# Patient Record
Sex: Female | Born: 1955 | Race: White | Hispanic: No | Smoking: Current every day smoker
Health system: Southern US, Community
[De-identification: ages and names within clinical notes are randomized; demographics above are authoritative.]

## PROBLEM LIST (undated history)

## (undated) DIAGNOSIS — F419 Anxiety disorder, unspecified: Secondary | ICD-10-CM

## (undated) DIAGNOSIS — Z86006 Personal history of melanoma in-situ: Secondary | ICD-10-CM

## (undated) DIAGNOSIS — Z808 Family history of malignant neoplasm of other organs or systems: Secondary | ICD-10-CM

## (undated) DIAGNOSIS — Z923 Personal history of irradiation: Secondary | ICD-10-CM

## (undated) DIAGNOSIS — N951 Menopausal and female climacteric states: Secondary | ICD-10-CM

## (undated) DIAGNOSIS — Z9289 Personal history of other medical treatment: Secondary | ICD-10-CM

## (undated) DIAGNOSIS — R51 Headache: Secondary | ICD-10-CM

## (undated) DIAGNOSIS — K219 Gastro-esophageal reflux disease without esophagitis: Secondary | ICD-10-CM

## (undated) DIAGNOSIS — R519 Headache, unspecified: Secondary | ICD-10-CM

## (undated) DIAGNOSIS — F411 Generalized anxiety disorder: Secondary | ICD-10-CM

## (undated) DIAGNOSIS — M1990 Unspecified osteoarthritis, unspecified site: Secondary | ICD-10-CM

## (undated) HISTORY — DX: Anxiety disorder, unspecified: F41.9

## (undated) HISTORY — PX: EXPLORATORY LAPAROTOMY: SUR591

## (undated) HISTORY — DX: Headache: R51

## (undated) HISTORY — DX: Menopausal and female climacteric states: N95.1

## (undated) HISTORY — PX: DILITATION & CURRETTAGE/HYSTROSCOPY WITH ESSURE: SHX5573

## (undated) HISTORY — DX: Headache, unspecified: R51.9

## (undated) HISTORY — PX: OTHER SURGICAL HISTORY: SHX169

## (undated) HISTORY — PX: HERNIA REPAIR: SHX51

## (undated) HISTORY — PX: TUBAL LIGATION: SHX77

## (undated) HISTORY — DX: Personal history of other medical treatment: Z92.89

## (undated) HISTORY — DX: Personal history of melanoma in-situ: Z86.006

## (undated) HISTORY — DX: Family history of malignant neoplasm of other organs or systems: Z80.8

## (undated) HISTORY — DX: Generalized anxiety disorder: F41.1

## (undated) HISTORY — DX: Personal history of irradiation: Z92.3

## (undated) HISTORY — DX: Gastro-esophageal reflux disease without esophagitis: K21.9

---

## 2002-08-23 ENCOUNTER — Ambulatory Visit (HOSPITAL_COMMUNITY): Admission: RE | Admit: 2002-08-23 | Discharge: 2002-08-23 | Payer: Self-pay | Admitting: *Deleted

## 2006-05-19 ENCOUNTER — Other Ambulatory Visit: Admission: RE | Admit: 2006-05-19 | Discharge: 2006-05-19 | Payer: Self-pay | Admitting: *Deleted

## 2007-05-22 ENCOUNTER — Other Ambulatory Visit: Admission: RE | Admit: 2007-05-22 | Discharge: 2007-05-22 | Payer: Self-pay | Admitting: *Deleted

## 2007-06-14 ENCOUNTER — Encounter: Admission: RE | Admit: 2007-06-14 | Discharge: 2007-06-14 | Payer: Self-pay | Admitting: Family Medicine

## 2013-02-14 ENCOUNTER — Ambulatory Visit (INDEPENDENT_AMBULATORY_CARE_PROVIDER_SITE_OTHER): Payer: 59 | Admitting: Psychiatry

## 2013-02-14 ENCOUNTER — Encounter (HOSPITAL_COMMUNITY): Payer: Self-pay | Admitting: Psychiatry

## 2013-02-14 VITALS — BP 115/68 | HR 74 | Ht 62.5 in | Wt 153.0 lb

## 2013-02-14 DIAGNOSIS — F411 Generalized anxiety disorder: Secondary | ICD-10-CM

## 2013-02-14 NOTE — Progress Notes (Signed)
Psychiatric Assessment Adult  Patient Identification:  Mallory [name]  Date of Evaluation:  02/14/2013  Chief Complaint:  Chief Complaint  Patient presents with  . Anxiety  . Depression    Anxiety Presents for initial (Mallory [name] 57 y/o  woman with symptoms of anxiety and depression.) visit. Onset was more than 5 years ago. Symptoms include depressed mood, excessive worry, hyperventilation, insomnia, irritability and nervous/anxious behavior. Patient reports no chest pain, dizziness, nausea, palpitations or shortness of breath. Symptoms occur occasionally. Duration: Hours to days. The severity of symptoms is causing significant distress. The symptoms are aggravated by family issues and work stress. The quality of sleep is poor. Nighttime awakenings: one to two.   Risk factors include emotional abuse. Past treatments include non-SSRI antidepressants and SSRIs. The treatment provided mild relief. Compliance with prior treatments has been good. Past compliance problems: None.   The patient reports that the reason she is her is at her husband's request.  She has been charged with shoplifting three times and her husband demanded she "see somebody" to evaluate for treatment options.  She is currently taking Venlafaxine 300 mg daily and feels she has been doing relatively well with the medication.  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 Review of Systems  Constitutional: Positive for irritability. Negative for fever, chills, activity change, appetite change and fatigue.  Respiratory: Negative for cough, chest tightness, shortness of breath and wheezing.   Cardiovascular: Negative for chest pain, palpitations and leg swelling.  Gastrointestinal: Negative for nausea, abdominal pain, diarrhea, constipation, blood in stool, abdominal distention and anal bleeding.  Neurological: Negative for dizziness,  facial asymmetry, light-headedness, numbness and headaches.  Psychiatric/Behavioral: The patient is nervous/anxious and has insomnia.    Filed Vitals:   02/14/13 1420  BP: 115/68  Pulse: 74  Height: 5' 2.5" (1.588 m)  Weight: 153 lb (69.4 kg)   Physical Exam  Vitals reviewed. Constitutional: She appears well-developed and well-nourished. No distress.  Skin: She is not diaphoretic.    Depressive Symptoms: depressed mood, anhedonia, anxiety,  (Hypo) Manic Symptoms:   Elevated Mood:  Negative Irritable Mood:  Yes Grandiosity:  Negative Distractibility:  Negative Labiality of Mood:  Yes Delusions:  No Hallucinations:  No Impulsivity:  No Sexually Inappropriate Behavior:  No Financial Extravagance:  No Flight of Ideas:  Negative  Anxiety Symptoms: Excessive Worry:  Yes Panic Symptoms:  No Agoraphobia:  Negative Obsessive Compulsive: Negative  Symptoms: None, Specific Phobias:  No-Chickens-she is was attacked by a chicken at 80-9 y/o Social Anxiety:  Negative  Psychotic Symptoms:  Hallucinations: Negative None Delusions:  No Paranoia:  No   Ideas of Reference:  No  PTSD Symptoms: Ever had a traumatic exposure:  Yes Had a traumatic exposure in the last month:  No Re-experiencing: Negative None Hypervigilance:  Yes Hyperarousal: Yes Increased Startle Response Irritability/Anger Avoidance: No None  Traumatic Brain Injury: Negative   Past Psychiatric History: Diagnosis: None  Hospitalizations: Patient denies.  Outpatient Care:  Patient denies.  Substance Abuse Care: Patient denies.  Self-Mutilation:  Patient denies.  Suicidal Attempts: Patient denies.  Violent Behaviors:  Patient denies.   Past Medical History:   Past Medical History  Diagnosis Date  . GERD (gastroesophageal reflux disease)   . Peri-menopause     History of Loss of Consciousness:  Negative Seizure History:  Negative Cardiac History:  Negative  Allergies:  Allergies not on  file  Current Medications:  Current Outpatient Prescriptions  Medication Sig Dispense Refill  . fexofenadine (ALLEGRA) 180 MG tablet Take 180 mg by mouth daily.      Marland Kitchen MINIVELLE 0.05 MG/24HR Place 1 patch onto the skin daily.      . pantoprazole (PROTONIX) 20 MG tablet Take 20 mg by mouth 2 (two) times daily.      . progesterone (PROMETRIUM) 200 MG capsule Take 1 capsule by mouth. Take every 3 months for 12 days.      Marland Kitchen venlafaxine XR (EFFEXOR-XR) 150 MG 24 hr capsule Take 150 mg by mouth 2 (two) times daily.       No current facility-administered medications for this visit.    Previous Psychotropic Medications:  Medication Dose   Fluoxetine-worked  Unknown  Effexor Unknown   Substance Abuse History in the last 12 months: Caffeine:  Nicotine: Patient denies.  Alcohol: Patient denies.  Illicit Drugs: Patient denies.   Medical Consequences of Substance Abuse: Patient denies.   Legal Consequences of Substance Abuse: Patient denies.   Family Consequences of Substance Abuse: Patient denies.   Blackouts:  No DT's:  No Withdrawal Symptoms:  Negative None  Social History: Current Place of Residence:[place], [place] Place of Birth: [place], [place] Family Members: Lives with her husband. She has one 74 y/o Daughter. Mallory [name]. Mother is still alive and she calls her twice a week. Marital Status:  Married Children: 1  Daughters: 1 Relationships: She reports that she has a friend she talks her main source of emotional support. Education:  [employer] Problems/Performance:Classes were difficult. Religious Beliefs/Practices: Yes. History of Abuse: verbal Occupational Experiences; Nursing-CNA, now working in Office manager. Military History:  None. Legal History:Charged with shoplifting. Hobbies/Interests: Reading, Gardening, Crafts  Family History:   Family History  Problem Relation Age of Onset  . Dementia Mother   . Dementia Father   . Coronary artery disease Maternal  Grandfather   . Cancer Maternal Grandfather   . Coronary artery disease Maternal Grandmother   . Cancer Maternal Grandmother   . Coronary artery disease Paternal Grandfather   . Cancer Paternal Grandfather   . Coronary artery disease Paternal Grandmother   . Cancer Paternal Grandmother   . Diabetes Cousin   . Alcohol abuse Neg Hx   . Anxiety disorder Neg Hx   . Bipolar disorder Neg Hx   . Depression Neg Hx   . Drug abuse Neg Hx   . OCD Neg Hx   . Schizophrenia Neg Hx   . ADD / ADHD Neg Hx     Mental Status Examination/Evaluation: Objective:  Appearance: Casual and Well Groomed  Eye Contact::  Good  Speech:  Clear and Coherent and Normal Rate  Volume:  Normal  Mood:  "good" 7/10  (0=Very depressed; 5=Neutral; 10=Very Happy)   Affect:  Appropriate, Congruent and Constricted  Thought Process:  Coherent, Linear and Logical  Orientation:  Full (Time, Place, and Person)  Thought Content:  WDL  Suicidal Thoughts:  No  Homicidal Thoughts:  No  Judgement:  Good  Insight:  Good  Psychomotor Activity:  Normal  Akathisia:  Negative  Handed:  Right  Memory 3/3 Intact; recent 3/3  AIMS (if indicated): Patient denies  Assets:  Communication Skills Desire for Improvement Financial Resources/Insurance Housing Intimacy Leisure Time Physical Health Resilience Social Support Talents/Skills Transportation    Laboratory/X-Ray Psychological Evaluation(s)   none  None   Assessment:    AXIS I Generalized Anxiety Disorder  AXIS II Cluster C Traits  rule out obsessive compulsive personality disorder  AXIS III Past Medical History  Diagnosis Date  . GERD (gastroesophageal reflux disease)   . Peri-menopause      AXIS IV problems with primary support group  AXIS V 51-60 moderate symptoms   Treatment Plan/Recommendations:  Plan of Care:  PLAN:  1. Affirm with the patient that the medications are taken as ordered. Patient  expressed understanding of how their medications were  to be used.    Laboratory:  No labs warranted at this time.   Psychotherapy: Therapy: brief supportive therapy provided. Discussed psychosocial stressors in detail. Continue current services.   Medications:  Will continue following psychiatric medications as written prior to this appointment a) Continue venlafaxine Xl 300 mg. -Risks and benefits, side effects and alternatives discussed with patient, he/she was given an opportunity to ask questions about his/her medication, illness, and treatment. All current psychiatric medications have been reviewed and discussed with the patient and adjusted as clinically appropriate. The patient has been provided an accurate and updated list of the medications being now prescribed.   Routine PRN Medications:  Negative  Consultations: The patient was encouraged to keep all PCP and specialty clinic appointments.   Safety Concerns:   Patient told to call clinic if any problems occur. Patient advised to go to  ER  if she should develop SI/HI, side effects, or if symptoms worsen. Has crisis numbers to call if needed.    Other:   8. Patient was instructed to return to clinic in 2 weeks.  9. The patient was advised to call and cancel their mental health appointment within 24 hours of the appointment, if they are unable to keep the appointment, as well as the three no show and termination from clinic policy. 10. The patient expressed understanding of the plan and agrees with the above.   Jacqulyn Cane, MD 6/4/20142:06 PM

## 2013-03-05 ENCOUNTER — Encounter (HOSPITAL_COMMUNITY): Payer: Self-pay | Admitting: Psychiatry

## 2013-03-05 ENCOUNTER — Ambulatory Visit (INDEPENDENT_AMBULATORY_CARE_PROVIDER_SITE_OTHER): Payer: 59 | Admitting: Psychiatry

## 2013-03-05 VITALS — BP 128/70 | HR 77 | Ht 62.5 in | Wt 154.0 lb

## 2013-03-05 DIAGNOSIS — F411 Generalized anxiety disorder: Secondary | ICD-10-CM

## 2013-03-05 MED ORDER — VENLAFAXINE HCL ER 150 MG PO CP24: 150.0000 mg | ORAL_CAPSULE | Freq: Two times a day (BID) | ORAL | Status: DC

## 2013-03-05 NOTE — Progress Notes (Signed)
[facility] Follow-up Appointment  Patient Identification:  Mallory [name]  Date of Evaluation:  03/05/2013  Chief Complaint:  Chief Complaint  Patient presents with  . Follow-up    HPI Comments: Mallory [name] 57 y/o  woman with symptoms of Generalized Anxiety Disorder.  The patient reports continued marital difficulties.  She has tried to talk to her husband but she reports he has no empathy.  She finds that she cannot talk to her daughter about her problems either, as she feels she will talk to her husband about what she talks about.  She reports she is considering a divorce. She has not gone to marital counseling yet but feels her husband will no participate.  Anxiety Presents for follow-up visit. Onset was more than 5 years ago. Symptoms include excessive worry and nervous/anxious behavior (same). Patient reports no chest pain, depressed mood (Improved), dizziness, hyperventilation, insomnia, irritability (Has improved.), nausea, palpitations or shortness of breath. Symptoms occur occasionally. Duration: Hours. The severity of symptoms is causing significant distress. The symptoms are aggravated by family issues and work stress. The quality of sleep is poor. Nighttime awakenings: one to two.   Risk factors include emotional abuse. Past treatments include non-SSRI antidepressants and SSRIs. The treatment provided mild relief. Compliance with prior treatments has been good. Past compliance problems: None. Compliance with medications is 76-100%. Treatment side effects: Patient denies.    Review of Systems  Constitutional: Negative for fever, chills, activity change, appetite change, irritability (Has improved.) and fatigue.  Respiratory: Negative for cough, chest tightness, shortness of breath and wheezing.   Cardiovascular: Negative for chest pain, palpitations and leg swelling.  Gastrointestinal: Negative for nausea, abdominal pain, diarrhea, constipation, blood in stool, abdominal  distention and anal bleeding.  Neurological: Negative for dizziness, facial asymmetry, light-headedness, numbness and headaches.  Psychiatric/Behavioral: The patient is nervous/anxious (same). The patient does not have insomnia.    Filed Vitals:   03/05/13 1010  BP: 128/70  Pulse: 77  Height: 5' 2.5" (1.588 m)  Weight: 154 lb (69.854 kg)   Physical Exam  Vitals reviewed. Constitutional: She appears well-developed and well-nourished. No distress.  Skin: She is not diaphoretic.  Musculoskeletal: Strength & Muscle Tone: within normal limits Gait & Station: normal Patient leans: N/A   Depressive Symptoms: depressed mood, anhedonia, anxiety,  (Hypo) Manic Symptoms:   Elevated Mood:  Negative Irritable Mood:  Yes Grandiosity:  Negative Distractibility:  Negative Labiality of Mood:  Yes Delusions:  No Hallucinations:  No Impulsivity:  No Sexually Inappropriate Behavior:  No Financial Extravagance:  No Flight of Ideas:  Negative  Anxiety Symptoms: Excessive Worry:  Yes Panic Symptoms:  No Agoraphobia:  Negative Obsessive Compulsive: Negative  Symptoms: None, Specific Phobias:  No-Chickens-she is was attacked by a chicken at [age] y/o Social Anxiety:  Negative  Psychotic Symptoms:  Hallucinations: Negative None Delusions:  No Paranoia:  No   Ideas of Reference:  No  PTSD Symptoms: Ever had a traumatic exposure:  Yes Had a traumatic exposure in the last month:  No Re-experiencing: Negative None Hypervigilance:  Yes Hyperarousal: Yes Increased Startle Response Irritability/Anger Avoidance: No None  Traumatic Brain Injury: Negative   Past Psychiatric History: Diagnosis: None  Hospitalizations: Patient denies.  Outpatient Care:  Patient denies.  Substance Abuse Care: Patient denies.  Self-Mutilation:  Patient denies.  Suicidal Attempts: Patient denies.  Violent Behaviors:  Patient denies.   Past Medical History:   Past Medical History  Diagnosis Date  .  GERD (gastroesophageal reflux disease)   .  Peri-menopause     History of Loss of Consciousness:  Negative Seizure History:  Negative Cardiac History:  Negative  Allergies:  No Known Allergies  Current Medications:  Current Outpatient Prescriptions  Medication Sig Dispense Refill  . ALPRAZolam (XANAX) 0.5 MG tablet Take 0.5 mg by mouth at bedtime as needed for anxiety.      . fexofenadine (ALLEGRA) 180 MG tablet Take 180 mg by mouth daily.      . progesterone (PROMETRIUM) 200 MG capsule Take 1 capsule by mouth. Take every 3 months for 12 days.      Marland Kitchen MINIVELLE 0.05 MG/24HR Place 1 patch onto the skin daily.      . pantoprazole (PROTONIX) 20 MG tablet Take 20 mg by mouth 2 (two) times daily.      Marland Kitchen venlafaxine XR (EFFEXOR-XR) 150 MG 24 hr capsule Take 150 mg by mouth 2 (two) times daily.       No current facility-administered medications for this visit.    Previous Psychotropic Medications:  Medication Dose   Fluoxetine-worked  Unknown  Effexor Unknown   Substance Abuse History in the last 12 months: Caffeine:  Nicotine: Patient denies.  Alcohol: Patient denies.  Illicit Drugs: Patient denies.   Medical Consequences of Substance Abuse: Patient denies.   Legal Consequences of Substance Abuse: Patient denies.   Family Consequences of Substance Abuse: Patient denies.   Blackouts:  No DT's:  No Withdrawal Symptoms:  Negative None  Social History: Current Place of Residence:,  Place of Birth: [place], [place] Family Members: Lives with her husband. She has one 35 y/o Daughter. Mallory [name]. Mother is still alive and she calls her twice a week. Marital Status:  Married Children: 1  Daughters: 1 Relationships: She reports that she has a friend she talks her main source of emotional support. Education:  [employer] Problems/Performance:Classes were difficult. Religious Beliefs/Practices: Yes. History of Abuse: verbal Occupational Experiences;  Nursing-CNA, now working in Office manager. Military History:  None. Legal History:Charged with shoplifting. Hobbies/Interests: Reading, Gardening, Crafts  Family History:   Family History  Problem Relation Age of Onset  . Dementia Mother   . Dementia Father   . Coronary artery disease Maternal Grandfather   . Cancer Maternal Grandfather   . Coronary artery disease Maternal Grandmother   . Cancer Maternal Grandmother   . Coronary artery disease Paternal Grandfather   . Cancer Paternal Grandfather   . Coronary artery disease Paternal Grandmother   . Cancer Paternal Grandmother   . Diabetes Cousin   . Alcohol abuse Neg Hx   . Anxiety disorder Neg Hx   . Bipolar disorder Neg Hx   . Depression Neg Hx   . Drug abuse Neg Hx   . OCD Neg Hx   . Schizophrenia Neg Hx   . ADD / ADHD Neg Hx     Mental Status Examination/Evaluation: Objective:  Appearance: Casual and Well Groomed  Eye Contact::  Good  Speech:  Clear and Coherent and Normal Rate  Volume:  Normal  Mood:  "good" 7/10  (0=Very depressed; 5=Neutral; 10=Very Happy)   Affect:  Appropriate, Congruent and Constricted  Thought Process:  Coherent, Linear and Logical  Orientation:  Full (Time, Place, and Person)  Thought Content:  WDL  Suicidal Thoughts:  No  Homicidal Thoughts:  No  Judgement:  Good  Insight:  Good  Psychomotor Activity:  Normal  Akathisia:  Negative  Handed:  Right  Memory 3/3 Intact; recent 3/3  AIMS (if indicated): Patient denies  Assets:  Communication Skills Desire for Improvement Financial Resources/Insurance Housing Intimacy Leisure Time Physical Health Resilience Social Support Talents/Skills Transportation    Laboratory/X-Ray Psychological Evaluation(s)   none  None   Assessment:    AXIS I Generalized Anxiety Disorder  AXIS II Cluster C Traits rule out obsessive compulsive personality disorder  AXIS III Past Medical History  Diagnosis Date  . GERD (gastroesophageal reflux disease)    . Peri-menopause      AXIS IV problems with primary support group  AXIS V 51-60 moderate symptoms   Treatment Plan/Recommendations:  Plan of Care:  PLAN:  1. Affirm with the patient that the medications are taken as ordered. Patient  expressed understanding of how their medications were to be used.    Laboratory:  No labs warranted at this time.   Psychotherapy: Therapy: brief supportive therapy provided. Discussed psychosocial stressors in detail. Advised patient to talk to a couples counselor and individual therapist.   Medications:  Will continue following psychiatric medications as written prior to this appointment: a) Continue venlafaxine XL 300 mg. B) Will consider hydroxyzine for anxiety if needed. -Risks and benefits, side effects and alternatives discussed with patient, he/she was given an opportunity to ask questions about his/her medication, illness, and treatment. All current psychiatric medications have been reviewed and discussed with the patient and adjusted as clinically appropriate. The patient has been provided an accurate and updated list of the medications being now prescribed.   Routine PRN Medications:  Negative  Consultations: The patient was encouraged to keep all PCP and specialty clinic appointments.   Safety Concerns:   Patient told to call clinic if any problems occur. Patient advised to go to  ER  if she should develop SI/HI, side effects, or if symptoms worsen. Has crisis numbers to call if needed.    Other:   8. Patient was instructed to return to clinic in 2 weeks.  9. The patient was advised to call and cancel their mental health appointment within 24 hours of the appointment, if they are unable to keep the appointment, as well as the three no show and termination from clinic policy. 10. The patient expressed understanding of the plan and agrees with the above.   Jacqulyn Cane, MD 6/23/201410:09 AM

## 2013-03-20 ENCOUNTER — Encounter (HOSPITAL_COMMUNITY): Payer: Self-pay | Admitting: Psychiatry

## 2013-03-20 ENCOUNTER — Ambulatory Visit (INDEPENDENT_AMBULATORY_CARE_PROVIDER_SITE_OTHER): Payer: 59 | Admitting: Psychiatry

## 2013-03-20 VITALS — BP 111/77 | HR 82 | Ht 62.5 in | Wt 154.0 lb

## 2013-03-20 DIAGNOSIS — F411 Generalized anxiety disorder: Secondary | ICD-10-CM

## 2013-03-20 MED ORDER — VENLAFAXINE HCL ER 150 MG PO CP24: 150.0000 mg | ORAL_CAPSULE | Freq: Two times a day (BID) | ORAL | Status: DC
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
## 2013-05-22 ENCOUNTER — Encounter (HOSPITAL_COMMUNITY): Payer: Self-pay | Admitting: Psychiatry

## 2013-05-22 ENCOUNTER — Ambulatory Visit (INDEPENDENT_AMBULATORY_CARE_PROVIDER_SITE_OTHER): Payer: 59 | Admitting: Psychiatry

## 2013-05-22 VITALS — BP 121/93 | HR 78 | Ht 62.5 in | Wt 156.0 lb

## 2013-05-22 DIAGNOSIS — F411 Generalized anxiety disorder: Secondary | ICD-10-CM

## 2013-05-22 MED ORDER — HYDROXYZINE HCL 25 MG PO TABS: 25.0000 mg | ORAL_TABLET | Freq: Two times a day (BID) | ORAL | Status: DC | PRN

## 2013-05-22 MED ORDER — VENLAFAXINE HCL ER 150 MG PO CP24: 150.0000 mg | ORAL_CAPSULE | Freq: Two times a day (BID) | ORAL | Status: DC
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
## 2013-06-19 ENCOUNTER — Ambulatory Visit (HOSPITAL_COMMUNITY): Payer: Self-pay | Admitting: Psychiatry

## 2013-06-20 ENCOUNTER — Telehealth (HOSPITAL_COMMUNITY): Payer: Self-pay

## 2013-06-20 NOTE — Telephone Encounter (Signed)
Acknowledged.

## 2013-06-20 NOTE — Telephone Encounter (Signed)
HUSBAND IS COMING WITH HER TO APPOINTMENT TOMORROW. HE DOESN'T BELIEVE SHE IS TELLING YOU EVERYTHING. SHE WANTED TO GIVE YOU A HEADS UP.

## 2013-06-21 ENCOUNTER — Ambulatory Visit (INDEPENDENT_AMBULATORY_CARE_PROVIDER_SITE_OTHER): Payer: 59 | Admitting: Psychiatry

## 2013-06-21 ENCOUNTER — Encounter (HOSPITAL_COMMUNITY): Payer: Self-pay | Admitting: Psychiatry

## 2013-06-21 VITALS — BP 102/74 | HR 100 | Ht 62.5 in | Wt 152.0 lb

## 2013-06-21 DIAGNOSIS — F411 Generalized anxiety disorder: Secondary | ICD-10-CM

## 2013-06-21 MED ORDER — VENLAFAXINE HCL ER 150 MG PO CP24: 150.0000 mg | ORAL_CAPSULE | Freq: Two times a day (BID) | ORAL | Status: DC

## 2013-06-21 MED ORDER — HYDROXYZINE HCL 25 MG PO TABS: 25.0000 mg | ORAL_TABLET | Freq: Two times a day (BID) | ORAL | Status: DC | PRN
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
## 2013-07-24 ENCOUNTER — Ambulatory Visit (HOSPITAL_COMMUNITY): Payer: Self-pay | Admitting: Psychiatry

## 2013-07-31 ENCOUNTER — Ambulatory Visit (INDEPENDENT_AMBULATORY_CARE_PROVIDER_SITE_OTHER): Payer: 59 | Admitting: Psychiatry

## 2013-07-31 ENCOUNTER — Encounter (HOSPITAL_COMMUNITY): Payer: Self-pay | Admitting: Psychiatry

## 2013-07-31 VITALS — BP 125/75 | HR 88 | Ht 62.25 in | Wt 154.0 lb

## 2013-07-31 DIAGNOSIS — F411 Generalized anxiety disorder: Secondary | ICD-10-CM

## 2013-07-31 MED ORDER — VENLAFAXINE HCL ER 150 MG PO CP24: 300.0000 mg | ORAL_CAPSULE | Freq: Two times a day (BID) | ORAL | Status: DC

## 2013-07-31 MED ORDER — HYDROXYZINE HCL 25 MG PO TABS: 25.0000 mg | ORAL_TABLET | Freq: Two times a day (BID) | ORAL | Status: DC | PRN
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
## 2013-09-12 ENCOUNTER — Ambulatory Visit (INDEPENDENT_AMBULATORY_CARE_PROVIDER_SITE_OTHER): Payer: 59 | Admitting: Psychiatry

## 2013-09-12 ENCOUNTER — Encounter (HOSPITAL_COMMUNITY): Payer: Self-pay | Admitting: Psychiatry

## 2013-09-12 VITALS — BP 117/67 | HR 72 | Wt 158.0 lb

## 2013-09-12 DIAGNOSIS — F411 Generalized anxiety disorder: Secondary | ICD-10-CM

## 2013-09-12 MED ORDER — VENLAFAXINE HCL ER 150 MG PO CP24: 300.0000 mg | ORAL_CAPSULE | Freq: Two times a day (BID) | ORAL | Status: DC
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
## 2013-10-18 ENCOUNTER — Encounter (HOSPITAL_COMMUNITY): Payer: Self-pay | Admitting: Psychiatry

## 2013-10-18 ENCOUNTER — Encounter (INDEPENDENT_AMBULATORY_CARE_PROVIDER_SITE_OTHER): Payer: Self-pay

## 2013-10-18 ENCOUNTER — Ambulatory Visit (INDEPENDENT_AMBULATORY_CARE_PROVIDER_SITE_OTHER): Payer: 59 | Admitting: Psychiatry

## 2013-10-18 VITALS — BP 113/68 | HR 95 | Wt 152.0 lb

## 2013-10-18 DIAGNOSIS — F411 Generalized anxiety disorder: Secondary | ICD-10-CM

## 2013-10-18 MED ORDER — VENLAFAXINE HCL ER 150 MG PO CP24: 300.0000 mg | ORAL_CAPSULE | Freq: Two times a day (BID) | ORAL | Status: DC

## 2013-10-18 MED ORDER — HYDROXYZINE HCL 25 MG PO TABS: 25.0000 mg | ORAL_TABLET | Freq: Two times a day (BID) | ORAL | Status: DC | PRN

## 2013-10-18 NOTE — Patient Instructions (Signed)
Find a grief counseling group: Coping with the Death of a Loved One [facility] [address], [address] [place], [place] [address]   Meetings: 1st Thursday of every month, 2 to 3 pm Registration is required. To register, please call [phone] ext. [phone]. Coping with the Death of a Social research officer, [facility]arent [facility] [address], [address] [place], [place] [address]   Registration is required. To register, please call [phone].
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
## 2013-11-29 ENCOUNTER — Encounter (INDEPENDENT_AMBULATORY_CARE_PROVIDER_SITE_OTHER): Payer: Self-pay

## 2013-11-29 ENCOUNTER — Ambulatory Visit (INDEPENDENT_AMBULATORY_CARE_PROVIDER_SITE_OTHER): Payer: 59 | Admitting: Psychiatry

## 2013-11-29 ENCOUNTER — Encounter (HOSPITAL_COMMUNITY): Payer: Self-pay | Admitting: Psychiatry

## 2013-11-29 VITALS — BP 121/77 | HR 77 | Wt 156.0 lb

## 2013-11-29 DIAGNOSIS — F411 Generalized anxiety disorder: Secondary | ICD-10-CM

## 2013-11-29 MED ORDER — VENLAFAXINE HCL ER 150 MG PO CP24: 300.0000 mg | ORAL_CAPSULE | Freq: Two times a day (BID) | ORAL | Status: DC

## 2013-11-29 MED ORDER — HYDROXYZINE HCL 25 MG PO TABS: 25.0000 mg | ORAL_TABLET | Freq: Two times a day (BID) | ORAL | Status: DC | PRN

## 2013-11-29 NOTE — Progress Notes (Signed)
[facility]  Mallory DewCarol D Komperda 1956/05/29  Patient Identification:  Mallory [name]  Date of Evaluation:  11/29/2013  Chief Complaint:  Chief Complaint  Patient presents with  . Follow-up  . Depression   HPI Comments: HPI Comments: Mrs. Mallory [name] 58 y/o  woman with symptoms of Generalized Anxiety Disorder.  The patient is here for medication management.  .  Location: The patient she has been feeling more tired. She stated that she has made an appointment for a gastroenterologist and a cardiologist.   .  Quality: She continues to have marital issues. She continues to work.   In the area of affective symptoms, patient appears mildly depressed. Patient denies current suicidal ideation, intent, or plan. Patient denies current homicidal ideation, intent, or plan. Patient denies auditory hallucinations. Patient denies visual hallucinations. Patient denies symptoms of paranoia. Patient states sleep is fair, with approximately 6 hours of sleep per night. Appetite is poor. Energy level is fair to low. Patient endorses some symptoms of anhedonia. Patient endorses periods hopelessness, helplessness, or guilt.   .  Severity: Depression:  5/10 (0=Very depressed; 5=Neutral; 10=Very Happy)  Anxiety-  5/10 (0=no anxiety; 5= moderate/tolerable anxiety; 10= panic attacks) .  Duration:More that 5 years ago   .  Timing:Continue to  wax and wane  .  Context: Family issues-Related to specific stressors regarding legal issues and her husband's and daughter's treatment of her; Legal Issues. Husband's health issues.   .  Modifying factors: Being away from home improves his mood.  .  Associated signs and symptoms: Symptoms include depressed mood, excessive worry (improved), irritability and nervous/anxious behavior (improved). Patient reports no chest pain, compulsions, dizziness, hyperventilation, insomnia, nausea, obsessions, palpitations, panic or shortness of breath. The  severity of symptoms is causing significant distress.  Hours of sleep per night: 6. The quality of sleep is poor. Nighttime awakenings: occasional.     Review of Systems  Constitutional: Negative for fever, chills, activity change, appetite change and fatigue.  Respiratory: Negative for cough, chest tightness and wheezing.   Cardiovascular: Negative for leg swelling.  Gastrointestinal: Negative for abdominal pain, diarrhea, constipation, blood in stool, abdominal distention and anal bleeding.  Neurological: Negative for facial asymmetry, light-headedness, numbness and headaches.   Filed Vitals:   11/29/13 1115  BP: 121/77  Pulse: 77  Weight: 156 lb (70.761 kg)     Physical Exam  Vitals reviewed. Constitutional: She appears well-developed and well-nourished. No distress.  Skin: She is not diaphoretic.  Musculoskeletal: Gait & Station: normal Patient leans: N/A  Depressive Symptoms: anhedonia,  (Hypo) Manic Symptoms:   Elevated Mood:  Negative Irritable Mood:  Yes Grandiosity:  Negative Distractibility:  Negative Labiality of Mood:  Yes Delusions:  No Hallucinations:  No Impulsivity:  No Sexually Inappropriate Behavior:  No Financial Extravagance:  No Flight of Ideas:  Negative  Anxiety Symptoms: Excessive Worry:  Yes Panic Symptoms:  No Agoraphobia:  Negative Obsessive Compulsive: Negative  Symptoms: None, Specific Phobias:  No-Chickens-she is was attacked by a chicken at [age] y/o Social Anxiety:  Negative  Psychotic Symptoms:  Hallucinations: Negative None Delusions:  No Paranoia:  No   Ideas of Reference:  No  PTSD Symptoms: Ever had a traumatic exposure:  Yes Had a traumatic exposure in the last month:  No Re-experiencing: Negative None Hypervigilance:  Yes Hyperarousal: Yes Irritability/Anger Avoidance: No None  Traumatic Brain Injury: Negative   Past Psychiatric History:Reviewed Diagnosis: None  Hospitalizations: Patient denies.  Outpatient  Care:  Patient denies.  Substance Abuse Care: Patient denies.  Self-Mutilation:  Patient denies.  Suicidal Attempts: Patient denies.  Violent Behaviors:  Patient denies.   Past Medical History:  Reviewed Past Medical History  Diagnosis Date  . GERD (gastroesophageal reflux disease)   . Peri-menopause   . Headache     History of Loss of Consciousness:  Negative Seizure History:  Negative Cardiac History:  Negative  Allergies:   Allergies  Allergen Reactions  . Banana Itching  . Latex     Current Medications: Reviewed Current Outpatient Prescriptions  Medication Sig Dispense Refill  . fexofenadine (ALLEGRA) 180 MG tablet Take 180 mg by mouth daily.      . hydrOXYzine (ATARAX/VISTARIL) 25 MG tablet Take 1 tablet (25 mg total) by mouth 2 (two) times daily as needed for anxiety.  30 tablet  2  . MINIVELLE 0.05 MG/24HR Place 1 patch onto the skin daily.      . pantoprazole (PROTONIX) 20 MG tablet Take 20 mg by mouth 2 (two) times daily.      . progesterone (PROMETRIUM) 200 MG capsule Take 1 capsule by mouth. Take every 3 months for 12 days.      . traMADol (ULTRAM) 50 MG tablet Take 50 mg by mouth daily.      Marland Kitchen venlafaxine XR (EFFEXOR-XR) 150 MG 24 hr capsule Take 2 capsules (300 mg total) by mouth 2 (two) times daily.  60 capsule  2   No current facility-administered medications for this visit.    Previous Psychotropic Medications:Reviewed Medication Dose   Fluoxetine-worked  Unknown  Effexor Unknown   Substance Abuse History in the last 12 months:Reviewed Nicotine: Patient denies.  Alcohol: Patient denies.  Illicit Drugs: Patient denies.   Medical Consequences of Substance Abuse: Patient denies.   Legal Consequences of Substance Abuse: Patient denies.   Family Consequences of Substance Abuse: Patient denies.   Blackouts:  No DT's:  No Withdrawal Symptoms:  Negative None  Social History:Reviewed Current Place of Residence:[place] Place of Birth:  [place], [place] Family Members: Lives with her husband. She has one 59 y/o Daughter. Mallory [name]. Mother is still alive and she calls her twice a week. Marital Status:  Married Children: 1  Daughters: 1 Relationships: She reports that she has a friend she talks her main source of emotional support. Education:  [employer] Problems/Performance:Classes were difficult. Religious Beliefs/Practices: Yes. History of Abuse: verbal Occupational Experiences; Nursing-CNA, now working in Office manager. Military History:  None. Legal History:Charged with shoplifting. Hobbies/Interests: Reading, Gardening, Crafts   Family History:  Reviewed Family History  Problem Relation Age of Onset  . Coronary artery disease Maternal Grandfather   . Cancer Maternal Grandfather   . Coronary artery disease Maternal Grandmother   . Cancer Maternal Grandmother   . Coronary artery disease Paternal Grandfather   . Cancer Paternal Grandfather   . Coronary artery disease Paternal Grandmother   . Cancer Paternal Grandmother   . Diabetes Cousin   . Alcohol abuse Neg Hx   . Anxiety disorder Neg Hx   . Bipolar disorder Neg Hx   . Depression Neg Hx   . Drug abuse Neg Hx   . OCD Neg Hx   . Schizophrenia Neg Hx   . ADD / ADHD Neg Hx     Psychiatric Specialty Exam:  Objective:  Appearance: Casual and Well Groomed  Eye Contact::  Good  Speech:  Clear and Coherent and Normal Rate  Volume:  Normal  Mood:  "tired"   Affect:  Appropriate, Congruent and Constricted  Thought Process:  Coherent, Linear and Logical  Orientation:  Full (Time, Place, and Person)  Thought Content:  WDL  Suicidal Thoughts:  No  Homicidal Thoughts:  No  Judgement:  Good  Insight:  Good  Psychomotor Activity:  Normal  Akathisia:  Negative  Handed:  Right  Memory 3/3 Intact; recent 3/3  Language-intact  Fund of knowledge-average to above average  AIMS (if indicated): Patient denies  Assets:  Manufacturing systems engineer Desire for  Improvement Financial Resources/Insurance Housing Intimacy Leisure Time Physical Health Resilience Social Support Talents/Skills Transportation    Laboratory/X-Ray Psychological Evaluation(s)   none  None   Assessment:   AXIS I Generalized Anxiety Disorder-improving  AXIS II Cluster C Traits rule out obsessive compulsive personality disorder  AXIS III Past Medical History  Diagnosis Date  . GERD (gastroesophageal reflux disease)   . Peri-menopause   . Headache      AXIS IV problems with primary support group  AXIS V 51-60 moderate symptoms   Treatment Plan/Recommendations: Plan of Care:  1. Affirm with the patient that the medications are taken as ordered. Patient  expressed understanding of how their medications were to be used.    Laboratory:  No labs warranted at this time.   Psychotherapy: Therapy: brief supportive therapy provided. Discussed psychosocial stressors in detail. Again, strongly dvised patient to talk to a couples counselor and individual therapist.  Also advised patient to consider grief counseling regarding her father's death. More than 50% of the visit was spent on individual therapy/counseling.   Medications:  Will continue following psychiatric medications as written prior to this appointment: a) Continue venlafaxine XL 300 mg. B) Will continue hydroxyzine 25 mg  BID. C) Patient does not want any change in her medications at this time. -Risks and benefits, side effects and alternatives discussed with patient, he/she was given an opportunity to ask questions about her medication, illness, and treatment. All current psychiatric medications have been reviewed and discussed with the patient and adjusted as clinically appropriate. The patient has been provided an accurate and updated list of the medications being now prescribed.   Routine PRN Medications:  Negative  Consultations: The patient was encouraged to keep all PCP and specialty clinic appointments.    Safety Concerns:   Patient told to call clinic if any problems occur. Patient advised to go to  ER  if she should develop SI/HI, side effects, or if symptoms worsen. Has crisis numbers to call if needed.    Other:   8. Patient was instructed to return to clinic in one month.  9. The patient was advised to call and cancel their mental health appointment within 24 hours of the appointment, if they are unable to keep the appointment, as well as the three no show and termination from clinic policy. 10. The patient expressed understanding of the plan and agrees with the above. 11. Patient informed that Mallory [name] 15th, 2015 would be my last day at this clinic.   Time spent: 25 minutes Jacqulyn Cane, MD 3/19/201511:14 AM

## 2013-12-20 ENCOUNTER — Encounter: Payer: Self-pay | Admitting: Cardiology

## 2013-12-20 DIAGNOSIS — K219 Gastro-esophageal reflux disease without esophagitis: Secondary | ICD-10-CM | POA: Insufficient documentation

## 2013-12-20 DIAGNOSIS — R519 Headache, unspecified: Secondary | ICD-10-CM | POA: Insufficient documentation

## 2013-12-20 DIAGNOSIS — R51 Headache: Secondary | ICD-10-CM

## 2013-12-20 DIAGNOSIS — N951 Menopausal and female climacteric states: Secondary | ICD-10-CM | POA: Insufficient documentation

## 2014-01-08 ENCOUNTER — Encounter: Payer: 59 | Admitting: Cardiology

## 2014-01-31 ENCOUNTER — Ambulatory Visit (INDEPENDENT_AMBULATORY_CARE_PROVIDER_SITE_OTHER): Payer: 59 | Admitting: Cardiology

## 2014-01-31 ENCOUNTER — Encounter: Payer: Self-pay | Admitting: Cardiology

## 2014-01-31 VITALS — BP 100/80 | HR 75 | Ht 63.0 in | Wt 154.4 lb

## 2014-01-31 DIAGNOSIS — R079 Chest pain, unspecified: Secondary | ICD-10-CM

## 2014-01-31 DIAGNOSIS — F17200 Nicotine dependence, unspecified, uncomplicated: Secondary | ICD-10-CM

## 2014-01-31 DIAGNOSIS — Z720 Tobacco use: Secondary | ICD-10-CM

## 2014-01-31 NOTE — Patient Instructions (Signed)
Your physician recommends that you continue on your current medications as directed. Please refer to the Current Medication list given to you today.  Your physician has requested that you have an exercise tolerance test. For further information please visit [email or web address]. Please also follow instruction sheet, as given.  Your physician recommends that you schedule a follow-up as needed  

## 2014-01-31 NOTE — Progress Notes (Signed)
[address] [place], [place]  [address] Phone: [phone] Fax:  [phone]  Date:  01/31/2014   ID:  Mallory DewCarol D Schoening, DOB July 31, 1956, MRN [id number]  PCP:  No primary provider on file.   History of Present Illness: Mallory [name] is a 58 y.o. female last seen several ago with anxiety, smoker, family history of CAD here for evaluation. Recent chest pain and left-sided numbness. Pain would come and go. Moderate. Difficult to describe. Palpitations. Husband is bipolar. Stressful. Daughter as well. Arm does not hurt now. Can notice some discomfort possible with hills, and stairs. Trying to get back into exercise.  In the past she has been evaluated with stress test which was normal. Grandparents with CAD. Still smoking.    Wt Readings from Last 3 Encounters:  11/29/13 156 lb (70.761 kg)  10/18/13 152 lb (68.947 kg)  09/12/13 158 lb (71.668 kg)     Past Medical History  Diagnosis Date  . GERD (gastroesophageal reflux disease)   . Peri-menopause   . Headache     Past Surgical History  Procedure Laterality Date  . Cesarean section    . Dilitation & currettage/hystroscopy with essure      Current Outpatient Prescriptions  Medication Sig Dispense Refill  . fexofenadine (ALLEGRA) 180 MG tablet Take 180 mg by mouth daily.      . hydrOXYzine (ATARAX/VISTARIL) 25 MG tablet Take 1 tablet (25 mg total) by mouth 2 (two) times daily as needed for anxiety.  30 tablet  3  . MINIVELLE 0.05 MG/24HR Place 1 patch onto the skin daily.      . pantoprazole (PROTONIX) 20 MG tablet Take 20 mg by mouth 2 (two) times daily.      . progesterone (PROMETRIUM) 200 MG capsule Take 1 capsule by mouth. Take every 3 months for 12 days.      . traMADol (ULTRAM) 50 MG tablet Take 50 mg by mouth daily.      Marland Kitchen. venlafaxine XR (EFFEXOR-XR) 150 MG 24 hr capsule Take 2 capsules (300 mg total) by mouth 2 (two) times daily.  60 capsule  3   No current facility-administered medications for this  visit.    Allergies:    Allergies  Allergen Reactions  . Banana Itching  . Latex     Social History:  The patient  reports that she has been smoking Cigarettes.  She has a 19.5 pack-year smoking history. She does not have any smokeless tobacco history on file. She reports that she does not drink alcohol or use illicit drugs.   Family History  Problem Relation Age of Onset  . Coronary artery disease Maternal Grandfather   . Cancer Maternal Grandfather   . Coronary artery disease Maternal Grandmother   . Cancer Maternal Grandmother   . Coronary artery disease Paternal Grandfather   . Cancer Paternal Grandfather   . Coronary artery disease Paternal Grandmother   . Cancer Paternal Grandmother   . Diabetes Cousin   . Alcohol abuse Neg Hx   . Anxiety disorder Neg Hx   . Bipolar disorder Neg Hx   . Depression Neg Hx   . Drug abuse Neg Hx   . OCD Neg Hx   . Schizophrenia Neg Hx   . ADD / ADHD Neg Hx     ROS:  Please see the history of present illness.   Denies any fevers, chills, orthopnea, PND, syncope, bleeding, strokelike symptoms   All other systems reviewed  and negative.   PHYSICAL EXAM: VS:  LMP 11/11/2012 Well nourished, well developed, in no acute distress HEENT: normal, [place]/AT, EOMI Neck: no JVD, normal carotid upstroke, no bruit Cardiac:  normal S1, S2; RRR; no murmur Lungs:  clear to auscultation bilaterally, no wheezing, rhonchi or rales Abd: soft, nontender, no hepatomegaly, no bruits Ext: no edema, 2+ distal pulses Skin: warm and dry GU: deferred Neuro: no focal abnormalities noted, AAO x 3  EKG:  01/31/14-Sinus rhythm with premature atrial complexes, no ST segment changes     ASSESSMENT AND PLAN:  1. Chest pain - very atypical. Menopausal, smoker. We will go ahead and proceed with exercise treadmill test. 2. Tobacco use-counseled smoking cessation. American Heart Association website. Many stressors at home. She's not ready to quit. 3. PRN followup, I will  follow up with stress test  Signed, Donato SchultzMark Laurine Kuyper, MD [facility]  01/31/2014 10:20 AM

## 2014-03-20 ENCOUNTER — Ambulatory Visit (INDEPENDENT_AMBULATORY_CARE_PROVIDER_SITE_OTHER): Payer: 59 | Admitting: Physician Assistant

## 2014-03-20 DIAGNOSIS — R079 Chest pain, unspecified: Secondary | ICD-10-CM

## 2014-03-20 DIAGNOSIS — R9439 Abnormal result of other cardiovascular function study: Secondary | ICD-10-CM

## 2014-03-20 NOTE — Progress Notes (Signed)
Exercise Treadmill Test  Pre-Exercise Testing Evaluation Rhythm: sinus tachycardia  Rate: 96 bpm     Test  Exercise Tolerance Test Ordering MD: Donato SchultzMark Skains, MD  Interpreting MD: Tereso NewcomerScott Archimedes Harold, PA-C  Unique Test No: 1  Treadmill:  1  Indication for ETT: chest pain - rule out ischemia  Contraindication to ETT: No   Stress Modality: exercise - treadmill  Cardiac Imaging Performed: non   Protocol: standard Bruce - maximal  Max BP:  176/74  Max MPHR (bpm):  162 85% MPR (bpm):  138  MPHR obtained (bpm):  171 % MPHR obtained:  105  Reached 85% MPHR (min:sec):  1:30 Total Exercise Time (min-sec):  6:00  Workload in METS:  7.0 Borg Scale: 12  Reason ETT Terminated:  desired heart rate attained    ST Segment Analysis At Rest: normal ST segments - no evidence of significant ST depression With Exercise: borderline ST changes  Other Information Arrhythmia:  No Angina during ETT:  present (1) Quality of ETT:  indeterminate  ETT Interpretation:  borderline (indeterminate) with non-specific ST changes  Comments: Fair exercise capacity. Patient did c/o chest pain. Normal BP response to exercise. There was borderline ST depression.  There was up-sloping ST depression and flat ST depression.   Recommendations: Will arrange ETT-Myoview. Signed, Tereso NewcomerScott Kandra Graven, PA-C   03/20/2014 11:08 AM

## 2014-03-20 NOTE — Progress Notes (Signed)
Agree with NUC. Donato SchultzSKAINS, Mallory Lueras, MD

## 2014-04-02 ENCOUNTER — Ambulatory Visit (HOSPITAL_COMMUNITY): Payer: 59 | Attending: Cardiology | Admitting: Radiology

## 2014-04-02 VITALS — BP 134/78 | HR 59 | Ht 63.0 in | Wt 151.0 lb

## 2014-04-02 DIAGNOSIS — F17200 Nicotine dependence, unspecified, uncomplicated: Secondary | ICD-10-CM | POA: Insufficient documentation

## 2014-04-02 DIAGNOSIS — R9439 Abnormal result of other cardiovascular function study: Secondary | ICD-10-CM

## 2014-04-02 DIAGNOSIS — R002 Palpitations: Secondary | ICD-10-CM | POA: Insufficient documentation

## 2014-04-02 DIAGNOSIS — R079 Chest pain, unspecified: Secondary | ICD-10-CM | POA: Insufficient documentation

## 2014-04-02 DIAGNOSIS — Z8249 Family history of ischemic heart disease and other diseases of the circulatory system: Secondary | ICD-10-CM | POA: Insufficient documentation

## 2014-04-02 MED ORDER — TECHNETIUM TC 99M SESTAMIBI GENERIC - CARDIOLITE
30.0000 | Freq: Once | INTRAVENOUS | Status: AC | PRN
  Administered 2014-04-02: 30 via INTRAVENOUS

## 2014-04-02 MED ORDER — TECHNETIUM TC 99M SESTAMIBI GENERIC - CARDIOLITE
10.0000 | Freq: Once | INTRAVENOUS | Status: AC | PRN
  Administered 2014-04-02: 10 via INTRAVENOUS

## 2014-04-02 NOTE — Progress Notes (Signed)
[facility] SITE 3 NUCLEAR MED [address] [place], [place] [address] [phone]    Cardiology Nuclear Med Study  Cleon DewCarol D Kita is a 58 y.o. female     MRN : [id number]     DOB: Jan 07, 1956  Procedure Date: 04/02/2014  Nuclear Med Background Indication for Stress Test:  Evaluation for Ischemia History:  No known CAD Cardiac Risk Factors: Family History - CAD and Smoker  Symptoms:  Chest Pain (last date of chest discomfort was one week ago) and Palpitations   Nuclear Pre-Procedure Caffeine/Decaff Intake:  7:30pm NPO After: 12:00am   Lungs:  clear O2 Sat: 96% on room air. IV 0.9% NS with Angio Cath:  22g  IV Site: R Hand  IV Started by:  Cathlyn Parsonsynthia Hasspacher, RN  Chest Size (in):  34 Cup Size: B  Height: 5\' 3"  (1.6 m)  Weight:  151 lb (68.493 kg)  BMI:  Body mass index is 26.76 kg/(m^2). Tech Comments:  n/a    Nuclear Med Study 1 or 2 day study: 1 day  Stress Test Type:  Stress  Reading MD: n/a  Order Authorizing Provider:  Louis MatteMark Skains,MD  Resting Radionuclide: Technetium 9731m Sestamibi  Resting Radionuclide Dose: 11.0 mCi   Stress Radionuclide:  Technetium 8431m Sestamibi  Stress Radionuclide Dose: 33.0 mCi           Stress Protocol Rest HR: 59 Stress HR: 144  Rest BP: 134/78 Stress BP: 162/75  Exercise Time (min): 8:00 METS: 10.1           Dose of Adenosine (mg):  n/a Dose of Lexiscan: n/a mg  Dose of Atropine (mg): n/a Dose of Dobutamine: n/a mcg/kg/min (at max HR)  Stress Test Technologist: Nelson ChimesSharon Brooks, BS-ES  Nuclear Technologist:  Harlow AsaElizabeth Young, CNMT     Rest Procedure:  Myocardial perfusion imaging was performed at rest 45 minutes following the intravenous administration of Technetium 3531m Sestamibi. Rest ECG: NSR - Normal EKG  Stress Procedure:  The patient exercised on the treadmill utilizing the Bruce Protocol for 8:00 minutes. The patient stopped due to fatigue and denied any chest pain.  Technetium 5131m Sestamibi was injected at peak  exercise and myocardial perfusion imaging was performed after a brief delay. Stress ECG: Insignificant upsloping ST segment depression.  QPS Raw Data Images:  Normal; no motion artifact; normal heart/lung ratio. Stress Images:  Normal homogeneous uptake in all areas of the myocardium. Rest Images:  Normal homogeneous uptake in all areas of the myocardium. Subtraction (SDS):  No evidence of ischemia. Transient Ischemic Dilatation (Normal <1.22):  0.91 Lung/Heart Ratio (Normal <0.45):  0.30  Quantitative Gated Spect Images QGS EDV:  85 ml QGS ESV:  36 ml  Impression Exercise Capacity:  Fair exercise capacity. BP Response:  Normal blood pressure response. Clinical Symptoms:  No symptoms. ECG Impression:  No significant ST segment change suggestive of ischemia. Comparison with Prior Nuclear Study: No previous nuclear study performed  Overall Impression:  Normal stress nuclear study.  LV Ejection Fraction: 58%.  LV Wall Motion:  NL LV Function; NL Wall Motion  Cassell Clementhomas Leisa Gault MD

## 2014-04-03 ENCOUNTER — Encounter: Payer: Self-pay | Admitting: Physician Assistant

## 2014-04-04 ENCOUNTER — Telehealth: Payer: Self-pay | Admitting: *Deleted

## 2014-04-04 NOTE — Telephone Encounter (Signed)
pt notified about normal myoview results and to f/u PRN per Dr. Anne FuSkains 01/2014 ov note.

## 2014-05-03 ENCOUNTER — Other Ambulatory Visit: Payer: Self-pay | Admitting: Obstetrics & Gynecology

## 2014-06-11 ENCOUNTER — Encounter (HOSPITAL_COMMUNITY): Payer: Self-pay | Admitting: Physician Assistant

## 2014-06-11 ENCOUNTER — Ambulatory Visit (INDEPENDENT_AMBULATORY_CARE_PROVIDER_SITE_OTHER): Payer: 59 | Admitting: Physician Assistant

## 2014-06-11 ENCOUNTER — Encounter (INDEPENDENT_AMBULATORY_CARE_PROVIDER_SITE_OTHER): Payer: Self-pay

## 2014-06-11 VITALS — BP 130/85 | HR 85 | Ht 62.0 in | Wt 151.0 lb

## 2014-06-11 DIAGNOSIS — F411 Generalized anxiety disorder: Secondary | ICD-10-CM

## 2014-06-11 MED ORDER — VENLAFAXINE HCL ER 150 MG PO CP24: 300.0000 mg | ORAL_CAPSULE | Freq: Every day | ORAL | Status: DC

## 2014-06-11 MED ORDER — HYDROXYZINE HCL 10 MG PO TABS: 10.0000 mg | ORAL_TABLET | ORAL | Status: DC | PRN

## 2014-06-11 NOTE — Progress Notes (Signed)
  [id number]  Mallory [name] [id number] 58 y.o.  06/11/2014 3:56 PM  Chief Complaint: GAD,   History of Present Illness:   Patient notes that she has been off of the Effexor 150mg  over the past week, she is tearful and has had multiple stressors including her family and her daughter has moved in with her. She notes that she has been significantly angry and crying all weekend long.    Suicidal Ideation: No Plan Formed: No Patient has means to carry out plan: No  Homicidal Ideation: No Plan Formed: No Patient has means to carry out plan: No  Review of Systems: Psychiatric: Agitation: Yes Hallucination: No Depressed Mood: Yes Insomnia: No Hypersomnia: No Altered Concentration: Yes Feels Worthless: Yes Grandiose Ideas: No Belief In Special Powers: No New/Increased Substance Abuse: No Compulsions: No  Neurologic: Headache: Yes Seizure: No Paresthesias: No  Past Medical Family, Social History: Adult daughter [phone] has moved in with her.  Outpatient Encounter Prescriptions as of 06/11/2014  Medication Sig  . dexlansoprazole (DEXILANT) 60 MG capsule Take 60 mg by mouth daily.  . fexofenadine (ALLEGRA) 180 MG tablet Take 180 mg by mouth daily.  . fluticasone (FLONASE) 50 MCG/ACT nasal spray Place 2 sprays into the nose.  . hydrOXYzine (ATARAX/VISTARIL) 10 MG tablet Take 10 mg by mouth as needed for anxiety.  Marland Kitchen. MINIVELLE 0.05 MG/24HR Place 1 patch onto the skin daily.  . progesterone (PROMETRIUM) 200 MG capsule Take 1 capsule by mouth. Take every 3 months for 12 days.  Marland Kitchen. venlafaxine XR (EFFEXOR-XR) 150 MG 24 hr capsule Take 300 mg by mouth daily with breakfast.  . [DISCONTINUED] venlafaxine XR (EFFEXOR-XR) 150 MG 24 hr capsule Take 2 capsules (300 mg total) by mouth 2 (two) times daily.  Marland Kitchen. estradiol (VIVELLE-DOT) 0.1 MG/24HR patch Place 1 patch onto the skin.  . hydrOXYzine (ATARAX/VISTARIL) 25 MG tablet Take 1 tablet (25 mg total) by mouth 2 (two)  times daily as needed for anxiety.    Past Psychiatric History/Hospitalization(s): Anxiety: Yes Bipolar Disorder: No Depression: Yes  6-7/10 Mania: No Psychosis: No Schizophrenia: No Personality Disorder: No Hospitalization for psychiatric illness: No History of Electroconvulsive Shock Therapy: No Prior Suicide Attempts: No  Physical Exam: Constitutional:  BP 130/85  Pulse 85  Ht 5\' 2"  (1.575 m)  Wt 151 lb (68.493 kg)  BMI 27.61 kg/m2  LMP 11/11/2012  General Appearance: alert, oriented, no acute distress  Musculoskeletal: Strength & Muscle Tone: within normal limits Gait & Station: normal Patient leans: N/A  Psychiatric: Speech (describe rate, volume, coherence, spontaneity, and abnormalities if any): Normal  Thought Process (describe rate, content, abstract reasoning, and computation): normal  Associations: Coherent and Relevant  Thoughts: normal  Mental Status: Orientation: oriented to person, place, time/date and situation Mood & Affect: normal affect Attention Span & Concentration: decreased  Medical Decision Making (Choose Three): Established Problem, Worsening (2)  Assessment: Axis I: GAD unstable   Plan:  1. Restart medication as planned. 2. Establish plan of care as discussed on next visit. 3. Call office for emergencies or go to nearest ED. 4. Follow in 7-10 days. Rona RavensNeil T. Jala Dundon RPAC 4:12 PM 06/11/2014

## 2014-06-21 ENCOUNTER — Ambulatory Visit (INDEPENDENT_AMBULATORY_CARE_PROVIDER_SITE_OTHER): Payer: 59 | Admitting: Physician Assistant

## 2014-06-21 ENCOUNTER — Encounter (HOSPITAL_COMMUNITY): Payer: Self-pay | Admitting: Physician Assistant

## 2014-06-21 VITALS — BP 107/61 | HR 76 | Ht 62.0 in | Wt 154.0 lb

## 2014-06-21 DIAGNOSIS — Z630 Problems in relationship with spouse or partner: Secondary | ICD-10-CM

## 2014-06-21 DIAGNOSIS — F411 Generalized anxiety disorder: Secondary | ICD-10-CM

## 2014-06-21 MED ORDER — VENLAFAXINE HCL ER 150 MG PO CP24: 300.0000 mg | ORAL_CAPSULE | Freq: Every day | ORAL | Status: DC

## 2014-06-21 NOTE — Progress Notes (Signed)
  [facility] [id number] Progress Note  Mallory [name] [id number] 58 y.o.  06/21/2014 9:45 AM  Chief Complaint: To follow up on her GAD.  History of Present Illness: The patient notes that she is better because she has been back on her medication and no longer feels that she is going to have a breakdown or "lose it." No side effects from the medication. Her sleep is good, her appetite is also good. She reports that she is coping "the best way she can" with her husband and daughter since her meds. She feels that she is letting things go easier and let it roll off of her or bother her. Suicidal Ideation: No Plan Formed: No Patient has means to carry out plan: No  Homicidal Ideation: No Plan Formed: No Patient has means to carry out plan: No  Review of Systems: Psychiatric: Agitation: Yes  About the same Hallucination: No Depressed Mood: Yes 5/10 Insomnia: No Hypersomnia: No Altered Concentration: No Feels Worthless: No Grandiose Ideas: No Belief In Special Powers: No New/Increased Substance Abuse: No Compulsions: No  Neurologic: Headache: Yes Seizure: No Paresthesias: No  Past Medical Family, Social History: No changes. Her husband sees Tevon Harmond-Townsend [facility].  Outpatient Encounter Prescriptions as of 06/21/2014  Medication Sig  . dexlansoprazole (DEXILANT) 60 MG capsule Take 60 mg by mouth daily.  Marland Kitchen. estradiol (VIVELLE-DOT) 0.1 MG/24HR patch Place 1 patch onto the skin.  . fexofenadine (ALLEGRA) 180 MG tablet Take 180 mg by mouth daily.  . fluticasone (FLONASE) 50 MCG/ACT nasal spray Place 2 sprays into the nose.  . hydrOXYzine (ATARAX/VISTARIL) 10 MG tablet Take 1 tablet (10 mg total) by mouth as needed for anxiety.  Marland Kitchen. MINIVELLE 0.05 MG/24HR Place 1 patch onto the skin daily.  . progesterone (PROMETRIUM) 200 MG capsule Take 1 capsule by mouth. Take every 3 months for 12 days.  Marland Kitchen. venlafaxine XR (EFFEXOR-XR) 150 MG 24 hr capsule Take 2 capsules (300  mg total) by mouth daily with breakfast.    Past Psychiatric History/Hospitalization(s): Anxiety: Yes 5/10 Bipolar Disorder: No Depression: No Mania: No Psychosis: No Schizophrenia: No Personality Disorder: No Hospitalization for psychiatric illness: No History of Electroconvulsive Shock Therapy: No Prior Suicide Attempts: No  Physical Exam: Constitutional:  LMP 11/11/2012  General Appearance: alert, oriented, no acute distress and well nourished  Musculoskeletal: Strength & Muscle Tone: within normal limits Gait & Station: normal Patient leans: N/A  Psychiatric: Speech (describe rate, volume, coherence, spontaneity, and abnormalities if any): normal rate and rhythm  Thought Process (describe rate, content, abstract reasoning, and computation): normal  Associations: Coherent and Relevant  Thoughts: normal  Mental Status: Orientation: oriented to person, place, time/date, situation and day of week Mood & Affect: normal affect improved, calmer Attention Span & Concentration: improved  Medical Decision Making (Choose Three): Established Problem, Stable/Improving (1)  Assessment: Axis I: GAD improving  Axis II:   Axis III:   Axis IV:   Axis V:    Plan: 1. Continue medication management as written. 2. Add Vit. D3 as 2 caps per day. 3. Add B12 Complex per day. 4. Recommended OPT for supportive care. 5. RTO NOV. 24 for follow up. 6. Call with problems or question.  Kamali Nephew, PA-C 06/21/2014

## 2014-06-21 NOTE — Patient Instructions (Signed)
1. Continue all medication as ordered. 2. Call this office if you have any questions or concerns. 3. Continue to get regular exercise 3-5 times a week. 4. Continue to eat a healthy nutritionally balanced diet. 5. Continue to reduce stress and anxiety through activities such as yoga, mindfulness, meditation and or prayer. 6. Keep all appointments with your out patient therapist and have notes forwarded to this office. (If you do not have one and would like to be scheduled with a therapist, please let our office assist you with this.) 7. Follow up as planned Nov. 24th.

## 2014-08-06 ENCOUNTER — Ambulatory Visit (HOSPITAL_COMMUNITY): Payer: Self-pay | Admitting: Physician Assistant

## 2014-08-13 ENCOUNTER — Ambulatory Visit (INDEPENDENT_AMBULATORY_CARE_PROVIDER_SITE_OTHER): Payer: 59 | Admitting: Physician Assistant

## 2014-08-13 ENCOUNTER — Encounter (HOSPITAL_COMMUNITY): Payer: Self-pay | Admitting: Physician Assistant

## 2014-08-13 DIAGNOSIS — Z630 Problems in relationship with spouse or partner: Secondary | ICD-10-CM

## 2014-08-13 DIAGNOSIS — F411 Generalized anxiety disorder: Secondary | ICD-10-CM

## 2014-08-13 MED ORDER — VENLAFAXINE HCL ER 150 MG PO CP24: 300.0000 mg | ORAL_CAPSULE | Freq: Every day | ORAL | Status: DC

## 2014-08-13 MED ORDER — HYDROXYZINE HCL 10 MG PO TABS: 10.0000 mg | ORAL_TABLET | ORAL | Status: DC | PRN

## 2014-08-13 NOTE — Patient Instructions (Signed)
1. Continue all medication as ordered. 2. Call this office if you have any questions or concerns. 3. Continue to get regular exercise 3-5 times a week. 4. Continue to eat a healthy nutritionally balanced diet. 5. Continue to reduce stress and anxiety through activities such as yoga, mindfulness, meditation and or prayer. 6. Keep all appointments with your out patient therapist. 7. Follow up as planned 3 months.

## 2014-08-13 NOTE — Progress Notes (Signed)
  [id number]  Mallory [name] [id number] 58 y.o.  08/13/2014 4:38 PM  Chief Complaint: To follow up on her GAD.  History of Present Illness:  Patient is following up. She notes that she is doing well. Things are about the same at home. She is staying busy during the holidays and avoiding conflict at this point.     Suicidal Ideation: No Plan Formed: No Patient has means to carry out plan: No  Homicidal Ideation: No Plan Formed: No Patient has means to carry out plan: No  Review of Systems: Psychiatric: Agitation: Yes  About the same Hallucination: No Depressed Mood: Yes 5/10 Insomnia: No Hypersomnia: No Altered Concentration: No Feels Worthless: No Grandiose Ideas: No Belief In Special Powers: No New/Increased Substance Abuse: No Compulsions: No  Neurologic: Headache: Yes Seizure: No Paresthesias: No  Past Medical Family, Social History: No changes. Her husband sees Mallory [name] at [facility].  Outpatient Encounter Prescriptions as of 08/13/2014  Medication Sig  . dexlansoprazole (DEXILANT) 60 MG capsule Take 60 mg by mouth daily.  Marland Kitchen. estradiol (VIVELLE-DOT) 0.1 MG/24HR patch Place 1 patch onto the skin.  . fexofenadine (ALLEGRA) 180 MG tablet Take 180 mg by mouth daily.  . fluticasone (FLONASE) 50 MCG/ACT nasal spray Place 2 sprays into the nose.  . hydrOXYzine (ATARAX/VISTARIL) 10 MG tablet Take 1 tablet (10 mg total) by mouth as needed for anxiety.  Marland Kitchen. MINIVELLE 0.05 MG/24HR Place 1 patch onto the skin daily.  . progesterone (PROMETRIUM) 200 MG capsule Take 1 capsule by mouth. Take every 3 months for 12 days.  Marland Kitchen. venlafaxine XR (EFFEXOR-XR) 150 MG 24 hr capsule Take 2 capsules (300 mg total) by mouth daily with breakfast.    Past Psychiatric History/Hospitalization(s): Anxiety: Yes  5/10 Bipolar Disorder: No Depression: No  4/10 Mania: No Psychosis: No Schizophrenia: No Personality Disorder: No Hospitalization for  psychiatric illness: No History of Electroconvulsive Shock Therapy: No Prior Suicide Attempts: No  Physical Exam: Constitutional:  LMP 11/11/2012  General Appearance: alert, oriented, no acute distress and well nourished  Musculoskeletal: Strength & Muscle Tone: within normal limits Gait & Station: normal Patient leans: N/A  Psychiatric: Speech (describe rate, volume, coherence, spontaneity, and abnormalities if any): normal rate and rhythm  Thought Process (describe rate, content, abstract reasoning, and computation): normal  Associations: Coherent and Relevant  Thoughts: normal  Mental Status: Orientation: oriented to person, place, time/date, situation and day of week Mood & Affect: normal affect improved, calmer Attention Span & Concentration: improved  Medical Decision Making (Choose Three): Established Problem, Stable/Improving (1)  Assessment: Axis I: GAD improving  Plan: 1. Continue medication management as written. 2. Add Vit. D3 as 2 caps per day. 3. Add B12 Complex per day. 4. Recommended OPT for supportive care. 5. RTO 3 months. 6. Call with problems or question. 7. Schedule with Rolly SalterSara Solomon. Francesa Eugenio, PA-C 08/13/2014

## 2014-08-27 ENCOUNTER — Encounter (HOSPITAL_COMMUNITY): Payer: Self-pay | Admitting: Licensed Clinical Social Worker

## 2014-08-27 ENCOUNTER — Ambulatory Visit (INDEPENDENT_AMBULATORY_CARE_PROVIDER_SITE_OTHER): Payer: 59 | Admitting: Licensed Clinical Social Worker

## 2014-08-27 DIAGNOSIS — F411 Generalized anxiety disorder: Secondary | ICD-10-CM

## 2014-08-27 NOTE — Progress Notes (Signed)
Patient:   Mallory [name]   DOB:   12-05-1955  MR Number:  [id number]  Location:  [facility] [address] [place] [address] Dept: [phone]           Date of Service:   08/27/14  Start Time:   4:00pm End Time:   5:05pm  Provider/Observer:  Marilu Favre Clinical Social Work       Billing Code/Service: [id number]  Comprehensive Clinical Assessment  Information for assessment provided by: patient   Chief Complaint:    Anxiety     Presenting Problem/Symptoms:  "I'm leaving my husband.  I'm tired of walking on pins and needles."  Reports he has unpredictable shifts of mood and he is often very negative and critical. Stressing about the holidays and limited finances  "Things are piling up.  I haven't finished decorating or shopping  It is getting to be overwhelming for me."  This is a pattern each year.    Avoids listening to Christmas music on radio because it triggers anxiety.        Behavioral Observation: REILYN NELSON  presents as a 58 y.o.-year-old  Caucasian Female who appeared her stated age. her dress was Appropriate and she was Casual and her manners were Appropriate to the situation.  There were not any physical disabilities noted.  she displayed an appropriate level of cooperation and motivation.    Previous MH/SA diagnoses: GAD      Mental Health Symptoms:    Depression:  Denies at this time No known family history of depression   Past episodes of depression: a few months ago when she ran out of Effexor  Anxiety: Moderate restlessness or feeling on edge, easily fatigued, irritability, racing, jumbled thoughts, muscle tension, sleep disturbance   Symptoms have worsened in the past month  Panic Attacks: Absent   Self-Harm Potential: Thoughts of Self-Harm: none Method: na Availability of means: na Is there a family history of suicide? no Previous attempts?  no Preoccupation with death? History of acts of self-harm? no  Dangerousness to Others Potential: denies      Mania/hypomania: na    Psychosis: na   Abuse/Trauma History: denies         Mental Status  Interactions:    Active   Attention:   Good  Memory:   Intact  Speech:   Normal   Flow of Thought:  Circumstantial                                                 Thought Content:  WNL  Orientation:   person, place and time/date  Judgment:   Good  Affect/Mood:   Anxious  Insight:   Good  Intelligence:   normal      Medical History:    Past Medical History  Diagnosis Date  . GERD (gastroesophageal reflux disease)   . Peri-menopause   . Headache   . Hx of cardiovascular stress test     ETT-Myoview (03/2014):  EF 58%, no ischemia; normal  . GAD (generalized anxiety disorder)      Current medications:       Outpatient Encounter Prescriptions as of 08/27/2014  Medication Sig  . cyclobenzaprine (FLEXERIL) 10 MG tablet   . dexlansoprazole (DEXILANT) 60 MG capsule Take 60 mg  by mouth daily.  Marland Kitchen. estradiol (VIVELLE-DOT) 0.1 MG/24HR patch Place 1 patch onto the skin.  . fexofenadine (ALLEGRA) 180 MG tablet Take 180 mg by mouth daily.  . fluticasone (FLONASE) 50 MCG/ACT nasal spray Place 2 sprays into the nose.  . hydrOXYzine (ATARAX/VISTARIL) 10 MG tablet Take 1 tablet (10 mg total) by mouth as needed for anxiety.  Marland Kitchen. MINIVELLE 0.05 MG/24HR Place 1 patch onto the skin daily.  . progesterone (PROMETRIUM) 100 MG capsule   . venlafaxine XR (EFFEXOR-XR) 150 MG 24 hr capsule Take 2 capsules (300 mg total) by mouth daily with breakfast.  . zonisamide (ZONEGRAN) 25 MG capsule               Mental Health/Substance Use Treatment History:    Saw a therapist "years ago" for issues related to coping with husband's mental health issues         Family Med/Psych History:  Family History  Problem Relation Age of Onset  . Coronary artery disease  Maternal Grandfather   . Cancer Maternal Grandfather   . Coronary artery disease Maternal Grandmother   . Cancer Maternal Grandmother   . Coronary artery disease Paternal Grandfather   . Cancer Paternal Grandfather   . Coronary artery disease Paternal Grandmother   . Cancer Paternal Grandmother   . Diabetes Cousin   . Alcohol abuse Neg Hx   . Anxiety disorder Neg Hx   . Bipolar disorder Neg Hx   . Depression Neg Hx   . Drug abuse Neg Hx   . OCD Neg Hx   . Schizophrenia Neg Hx   . ADD / ADHD Neg Hx        Substance Use History:    Drinks alcohol on special occassions   Tobacco: smokes half a pack a day      Marital Status: Married for almost 40 years Husband has bipolar and medical issues (diabetic, a bad back, something wrong with both his feet).  He is retired.   Reports living with him can be very hard.  Has a very "negative attitude."  This creates tension in the home.   Notes husband was an only child.  Called his mom daily and would often talk for hours.  Okey RegalCarol felt excluded.    Lives with: husband  Family Relationships:  Relationship with 58 yr old daughter has ups and downs.  She has separated from her husband within the past 6 months.   Mom is in good health, but there is some short term memory loss.  Relationship has improved. Breah's father died two years ago.  He had COPD.  Died in his sleep.   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  She worries excessively about a variety of things.  She gets overwhelmed, feels restless, has jumbled  thoughts, muscle tension, irritability, fatigue, and problems with her sleep.  She indicated that she finds this time of year to be especially stressful.   Okey RegalCarol indicated she has not been happy with her marriage for quite some time.  She talked about how it is hard for her to stay positive when he has such a negative attitude.  When she is around him she automatically tenses up.    Disposition/Plan:  Recommending individual therapy every other week with a focus on helping Okey RegalCarol identify and change negative or irrational thinking patterns and teach her skills for stress management. Continue medication management with Verne SpurrNeil Mashburn, PA-C.  Diagnosis:  F41.1 Generalized Anxiety Disorder

## 2014-09-02 ENCOUNTER — Encounter (INDEPENDENT_AMBULATORY_CARE_PROVIDER_SITE_OTHER): Payer: Self-pay

## 2014-09-02 ENCOUNTER — Ambulatory Visit (INDEPENDENT_AMBULATORY_CARE_PROVIDER_SITE_OTHER): Payer: 59 | Admitting: Licensed Clinical Social Worker

## 2014-09-02 DIAGNOSIS — F411 Generalized anxiety disorder: Secondary | ICD-10-CM

## 2014-09-02 NOTE — Psych (Signed)
   THERAPIST PROGRESS NOTE  Session Time: 11:10am- 12:15pm  Participation Level: Active  Behavioral Response: Fairly GroomedAlertAnxious and tearful  Type of Therapy: Individual Therapy  Treatment Goals addressed: Anxiety  Interventions: Strength-based  Therapist Intevention: Gathered information about the dynamics of Mikayela's marriage as well as her relationship with her daughter.  Discussed how being criticized has affected her feelings of self-worth and choices she has been making.  Pointed out that patient appears to be very hard on herself.  Provided her with positive feedback regarding how she has made choices to take care of herself even though others have not agreed with those choices.  Encouraged her to continue to reach out to friends she trusts for support.     Summary: Okey RegalCarol described the various ways her husband implies to her that he doesn't trust her.  She indicated that he brings up past mistakes she has made on a frequent basis.  She noted that they do not talk to each other much.  She keeps quiet because she wants to avoid conflict.  Okey RegalCarol indicated she dwells on the fact that she has a shoplifting charge on her record.  She often thinks about how it limits her ability to obtain employment.  She worries about money excessively, in part because her husband complains so much about having to pay the bills.  Okey RegalCarol agreed that she puts herself down a lot.  She noted that she feels the need to provide justification for her actions.       Suicidal/Homicidal: denies both   Plan: Return again in one to two weeks.  May focus on cognitive restructuring at that time.  Diagnosis:  Generalized Anxiety Disorder        Darrin LuisSolomon, Sarah A, LCSW 09/02/2014

## 2014-09-23 ENCOUNTER — Ambulatory Visit (INDEPENDENT_AMBULATORY_CARE_PROVIDER_SITE_OTHER): Payer: 59 | Admitting: Licensed Clinical Social Worker

## 2014-09-23 ENCOUNTER — Encounter (INDEPENDENT_AMBULATORY_CARE_PROVIDER_SITE_OTHER): Payer: Self-pay

## 2014-09-23 DIAGNOSIS — F411 Generalized anxiety disorder: Secondary | ICD-10-CM | POA: Diagnosis not present

## 2014-09-23 NOTE — Psych (Signed)
   THERAPIST PROGRESS NOTE  Session Time: 9:15-10:00am  Participation Level: Active  Behavioral Response: Fairly GroomedAlertAnxious and tearful  Type of Therapy: Individual Therapy  Treatment Goals addressed: Anxiety  Interventions: Strength-based  Therapist Intevention: Explored behavior patterns for coping with painful feelings.  Helped her identify unhelpful thinking patterns that are keeping her stuck.  Encouraged her to focus on what she can do rather than the hopelessness of her situation.  Summary: Patient described how she prevents herself from expressing her feelings most of the time.  She talked about feeling like an outsider in her own family.  She indicated part of her is ready to leave her marriage, but another part of her tells her it is not the right time.  She acknowledged that she does need to work on speaking up about how she feels.     Continues to spend a fair amount of time worrying each day.    Suicidal/Homicidal: denies both   Plan: Return again in one to two weeks.    Diagnosis:  Generalized Anxiety Disorder        Darrin LuisSolomon, Sarah A, LCSW 09/23/2014

## 2014-10-07 ENCOUNTER — Ambulatory Visit (INDEPENDENT_AMBULATORY_CARE_PROVIDER_SITE_OTHER): Payer: 59 | Admitting: Licensed Clinical Social Worker

## 2014-10-07 ENCOUNTER — Encounter (INDEPENDENT_AMBULATORY_CARE_PROVIDER_SITE_OTHER): Payer: Self-pay

## 2014-10-07 DIAGNOSIS — F411 Generalized anxiety disorder: Secondary | ICD-10-CM | POA: Diagnosis not present

## 2014-10-07 NOTE — Psych (Signed)
   THERAPIST PROGRESS NOTE  Session Time: 10:10-11:00am  Participation Level: Active  Behavioral Response: Fairly GroomedAlert Euthymic  Type of Therapy: Individual Therapy  Treatment Goals addressed: Anxiety  Interventions: Strength-based  Therapist Intevention: Discussed how patient chooses to think optimistically and how it helps her cope with difficult situations.  Emphasized that all situations can be viewed in [name] positive or negative light and we have choices about which perspective we focus on.    Summary:   Reports worrying less often.  When she catches herself worrying she has been telling herself to focus on things she has control of.  She said, "I try to focus on things that make me feel good and making the best of [name] situation." Patient described her "bucket list" in life.  She mentioned [name] few things that she has been proud of accomplishing.       Suicidal/Homicidal: denies both   Plan:  Next session is scheduled in approximately 2 weeks.  If she is doing well at that time will discuss spacing out sessions further.  Diagnosis:  Generalized Anxiety Disorder        Darrin LuisSolomon, Mallory Amborn A, LCSW 10/07/2014

## 2014-10-21 ENCOUNTER — Ambulatory Visit (HOSPITAL_COMMUNITY): Payer: Self-pay | Admitting: Licensed Clinical Social Worker

## 2014-10-22 ENCOUNTER — Ambulatory Visit (HOSPITAL_COMMUNITY): Payer: Self-pay | Admitting: Licensed Clinical Social Worker

## 2014-11-12 ENCOUNTER — Ambulatory Visit (HOSPITAL_COMMUNITY): Payer: Self-pay | Admitting: Physician Assistant

## 2014-11-14 ENCOUNTER — Ambulatory Visit (HOSPITAL_COMMUNITY): Payer: Self-pay | Admitting: Physician Assistant

## 2014-11-15 ENCOUNTER — Ambulatory Visit (HOSPITAL_COMMUNITY): Payer: Self-pay | Admitting: Physician Assistant

## 2014-11-21 ENCOUNTER — Encounter (INDEPENDENT_AMBULATORY_CARE_PROVIDER_SITE_OTHER): Payer: Self-pay

## 2014-11-21 ENCOUNTER — Ambulatory Visit (INDEPENDENT_AMBULATORY_CARE_PROVIDER_SITE_OTHER): Payer: 59 | Admitting: Licensed Clinical Social Worker

## 2014-11-21 DIAGNOSIS — F411 Generalized anxiety disorder: Secondary | ICD-10-CM

## 2014-11-21 NOTE — Psych (Signed)
   THERAPIST PROGRESS NOTE  Session Time: 10:00am-11:00am  Participation Level: Active  Behavioral Response: Fairly GroomedAlert Anxious  Type of Therapy: Individual Therapy  Treatment Goals addressed: Anxiety  Interventions: DBT-mindfulness  Therapist Intevention: Gathered information about significant events and changes in mood and functioning since last seen in late January.   Introduced the concept of mindfulness.      Summary:   Reports a significant increase in worrying.  Has been having a lot of headaches and body tension.  Many of her worries have been associated with financial stress.  Learned that her husband has used up his retirement savings.  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    Receptive to concepts presented today.  Acknowledged that she often gets caught up in dwelling on the past or worrying about the future.  Expressed intentions of practicing mindful walking and breathing.    Suicidal/Homicidal: denies both   Plan:    Will check on implementation of mindfulness practice.  Will introduce some additional mindfulness exercises.  Diagnosis:  Generalized Anxiety Disorder        Darrin LuisSolomon, Sarah A, LCSW 11/21/2014

## 2014-11-28 ENCOUNTER — Ambulatory Visit (HOSPITAL_COMMUNITY): Payer: Self-pay | Admitting: Physician Assistant

## 2014-12-03 ENCOUNTER — Encounter (HOSPITAL_COMMUNITY): Payer: Self-pay | Admitting: Physician Assistant

## 2014-12-03 ENCOUNTER — Encounter (INDEPENDENT_AMBULATORY_CARE_PROVIDER_SITE_OTHER): Payer: Self-pay

## 2014-12-03 ENCOUNTER — Ambulatory Visit (INDEPENDENT_AMBULATORY_CARE_PROVIDER_SITE_OTHER): Payer: 59 | Admitting: Physician Assistant

## 2014-12-03 VITALS — BP 139/86 | HR 77 | Ht 62.0 in | Wt 151.0 lb

## 2014-12-03 DIAGNOSIS — F411 Generalized anxiety disorder: Secondary | ICD-10-CM | POA: Diagnosis not present

## 2014-12-03 DIAGNOSIS — Z630 Problems in relationship with spouse or partner: Secondary | ICD-10-CM | POA: Diagnosis not present

## 2014-12-03 DIAGNOSIS — Z780 Asymptomatic menopausal state: Secondary | ICD-10-CM | POA: Insufficient documentation

## 2014-12-03 MED ORDER — VENLAFAXINE HCL ER 150 MG PO CP24: 300.0000 mg | ORAL_CAPSULE | Freq: Every day | ORAL | Status: DC

## 2014-12-03 NOTE — Progress Notes (Signed)
  [id number]  Mallory DewCarol D Kotz [id number] 59 y.o.  12/03/2014 11:13 AM  Chief Complaint: To follow up on her GAD.  History of Present Illness:  Patient continues to struggle with her marital situation in that she feels that she is at her breaking point. Her husband continues to harp about money, he has closed all of his retirement accounts. She feels that she can't move forward without more money, and she admits that she doesn't manage money well.     He continues to maximize his duties while minimizing hers,    Suicidal Ideation: No Plan Formed: No Patient has means to carry out plan: No  Homicidal Ideation: No Plan Formed: No Patient has means to carry out plan: No  Review of Systems: Psychiatric: Agitation: Yes  About the same Hallucination: No Depressed Mood: Yes 5/10 Insomnia: No Hypersomnia: No Altered Concentration: No Feels Worthless: No Grandiose Ideas: No Belief In Special Powers: No New/Increased Substance Abuse: No Compulsions: No  Neurologic: Headache: Yes Seizure: No Paresthesias: No  Past Medical Family, Social History: No changes. Her [facility] sees Mallory [name] [facility].  Outpatient Encounter Prescriptions as of 12/03/2014  Medication Sig  . cyclobenzaprine (FLEXERIL) 10 MG tablet   . dexlansoprazole (DEXILANT) 60 MG capsule Take 60 mg by mouth daily.  Marland Kitchen. estradiol (VIVELLE-DOT) 0.1 MG/24HR patch Place 1 patch onto the skin.  . fexofenadine (ALLEGRA) 180 MG tablet Take 180 mg by mouth daily.  . fluticasone (FLONASE) 50 MCG/ACT nasal spray Place 2 sprays into the nose.  . hydrOXYzine (ATARAX/VISTARIL) 10 MG tablet Take 1 tablet (10 mg total) by mouth as needed for anxiety.  Marland Kitchen. MINIVELLE 0.05 MG/24HR Place 1 patch onto the skin daily.  . progesterone (PROMETRIUM) 100 MG capsule   . venlafaxine XR (EFFEXOR-XR) 150 MG 24 hr capsule Take 2 capsules (300 mg total) by mouth daily with breakfast.  . zonisamide  (ZONEGRAN) 25 MG capsule     Past Psychiatric History/Hospitalization(s): Anxiety: Yes  5/10 Bipolar Disorder: No Depression: No  4/10 Mania: No Psychosis: No Schizophrenia: No Personality Disorder: No Hospitalization for psychiatric illness: No History of Electroconvulsive Shock Therapy: No Prior Suicide Attempts: No  Physical Exam: Constitutional:  BP 139/86 mmHg  Pulse 77  Ht 5\' 2"  (1.575 m)  Wt 151 lb (68.493 kg)  BMI 27.61 kg/m2  LMP 11/11/2012  General Appearance: alert, oriented, no acute distress and well nourished  Musculoskeletal: Strength & Muscle Tone: within normal limits Gait & Station: normal Patient leans: N/A  Psychiatric: Speech (describe rate, volume, coherence, spontaneity, and abnormalities if any): normal rate and rhythm  Thought Process (describe rate, content, abstract reasoning, and computation): normal  Associations: Coherent and Relevant  Thoughts: normal  Mental Status: Orientation: oriented to person, place, time/date, situation and day of week Mood & Affect: normal affect improved, calmer Attention Span & Concentration: improved  Medical Decision Making (Choose Three): Established Problem, Stable/Improving (1)  Assessment: Axis I: GAD improving  Plan: 1. Continue medication management as written. 2. Add Vit. D3 as 2 caps per day. 3. Add B12 Complex per day. 4. Discussed smoking cessation especially in regards to continuing hormone therapy. 5. RTO 3 months. 6. Call with problems or question. 7. TSH for evaluation. Rona RavensNeil T. Kourtni Stineman RPAC 11:44 AM 12/03/2014

## 2014-12-03 NOTE — Patient Instructions (Signed)
1. Continue all medication as ordered. 2. Call this office if you have any questions or concerns. 3. Continue to get regular exercise 3-5 times a week. 4. Continue to eat a healthy nutritionally balanced diet. 5. Continue to reduce stress and anxiety through activities such as yoga, mindfulness, meditation and or prayer. 6. Keep all appointments with your out patient therapist and have notes forwarded to this office. (If you do not have one and would like to be scheduled with a therapist, please let our office assist you with this. 7. Follow up as planned 3 months. 

## 2014-12-03 NOTE — Addendum Note (Signed)
Addended by: Verne SpurrMASHBURN, Javari Bufkin T on: 12/03/2014 11:57 AM   Modules accepted: Orders

## 2015-01-01 LAB — TSH: TSH: 1.386 u[IU]/mL (ref 0.350–4.500)

## 2015-01-03 ENCOUNTER — Ambulatory Visit (INDEPENDENT_AMBULATORY_CARE_PROVIDER_SITE_OTHER): Payer: 59 | Admitting: Licensed Clinical Social Worker

## 2015-01-03 DIAGNOSIS — F411 Generalized anxiety disorder: Secondary | ICD-10-CM | POA: Diagnosis not present

## 2015-01-06 NOTE — Psych (Signed)
   THERAPIST PROGRESS NOTE  Session Time: 4:10pm-5:10pm  Participation Level: Active  Behavioral Response: Fairly GroomedAlert Anxious  Type of Therapy: Individual Therapy  Treatment Goals addressed: Anxiety  Interventions: Supportive and solution-focused  Therapist Intevention: Gathered information about significant events and changes in mood and functioning since last seen in March.   Had patient complete a GAD-7 to assess severity of anxiety symptoms. Discussed how patient anticipates continuing to have excessive anxiety as long as she lives with her husband.    Summary:   Reported "feeling scattered" lately.  Described how she feels the need to "keep her guard up" when she is around her husband.  Indicated she feels calm when she spends time away from him. GAD-7 score was 14 (moderately severe).  Items she indicated experiencing daily were trouble relaxing and becoming easily annoyed.   Reports she has made some steps towards separating from her husband.  Has found a place where she can live rent-free.  Has started to box things up.  Acknowledges that actually taking the step to leave will be difficult.       Suicidal/Homicidal: denies both   Plan:    Return in approximately one month.    Diagnosis:  Generalized Anxiety Disorder        Darrin LuisSolomon, Sarah A, LCSW 01/06/2015

## 2015-01-15 ENCOUNTER — Ambulatory Visit (HOSPITAL_COMMUNITY): Payer: Self-pay | Admitting: Licensed Clinical Social Worker

## 2015-01-29 ENCOUNTER — Ambulatory Visit (INDEPENDENT_AMBULATORY_CARE_PROVIDER_SITE_OTHER): Payer: 59 | Admitting: Licensed Clinical Social Worker

## 2015-01-29 DIAGNOSIS — F411 Generalized anxiety disorder: Secondary | ICD-10-CM

## 2015-01-29 NOTE — Psych (Signed)
   THERAPIST PROGRESS NOTE  Session Time: 10:15-11:10am  Participation Level: Active  Behavioral Response: Well Groomed Alert Tearful at times  Type of Therapy: Individual Therapy  Treatment Goals addressed: Anxiety  Interventions: Supportive and solution-focused  Therapist Intevention: Discussed a quote which patient said describes her situation well.  Talked about what should and should not be communicated between a parent and child.  Discussed how patient does not feel as though she cannot have the relationship she would like to have with her daughter as long as her husband is still living.  Encouraged patient to pursue interests even though her husband may disapprove.       Summary:   The quote patient brought up for discussion was "Don't ask kids to deal with adult situations.  Don't burden them with situations they can't control."  She expressed disapproval of the relationship that has developed between her husband and daughter.  She thinks he has influenced her thinking in a negative way.  Talked about how she feels that she can't share her perspective with her daughter because it would most likely be shared with her husband and then she would have to deal with his negative comments.  Identified many activities she is interested in pursuing: volunteering, contributing to historic preservation in the community, and learning to play golf or tennis.   Continues to experience feelings of anxiety most of the day.     Suicidal/Homicidal: denies both   Plan:     Scheduled to return in approximately one month.  Diagnosis:  Generalized Anxiety Disorder        Darrin LuisSolomon, Sarah A, LCSW 01/29/2015

## 2015-02-12 ENCOUNTER — Ambulatory Visit (HOSPITAL_COMMUNITY): Payer: Self-pay | Admitting: Licensed Clinical Social Worker

## 2015-02-28 ENCOUNTER — Ambulatory Visit (INDEPENDENT_AMBULATORY_CARE_PROVIDER_SITE_OTHER): Payer: 59 | Admitting: Physician Assistant

## 2015-02-28 ENCOUNTER — Ambulatory Visit (INDEPENDENT_AMBULATORY_CARE_PROVIDER_SITE_OTHER): Payer: 59 | Admitting: Licensed Clinical Social Worker

## 2015-02-28 ENCOUNTER — Encounter (HOSPITAL_COMMUNITY): Payer: Self-pay | Admitting: Physician Assistant

## 2015-02-28 VITALS — BP 128/68 | HR 70 | Ht 62.0 in | Wt 151.0 lb

## 2015-02-28 DIAGNOSIS — Z630 Problems in relationship with spouse or partner: Secondary | ICD-10-CM

## 2015-02-28 DIAGNOSIS — F411 Generalized anxiety disorder: Secondary | ICD-10-CM

## 2015-02-28 MED ORDER — VENLAFAXINE HCL ER 150 MG PO CP24: 300.0000 mg | ORAL_CAPSULE | Freq: Every day | ORAL | Status: DC

## 2015-02-28 MED ORDER — HYDROXYZINE HCL 10 MG PO TABS: 10.0000 mg | ORAL_TABLET | ORAL | Status: DC | PRN

## 2015-02-28 NOTE — Progress Notes (Signed)
THERAPIST PROGRESS NOTE  Session Time: 3:15-4:00pm  Participation Level: Active  Behavioral Response: Well Groomed Alert Dysphoric  Type of Therapy: Individual Therapy  Treatment Goals addressed: Anxiety  Interventions: Mindfulness  Therapist Intevention:  Discussed the various ways stress manifests itself in the body. Reviewed the concept of mindfulness.  Reminded patient of how she can incorporate mindfulness into her daily life.   Encouraged patient to get back in the habit of taking walks.   Summary:   Reported feeling tired and having muscle tension.  Attributed it to stress.   Repeated many of the same concerns regarding her relationship with her husband and daughter.   Indicated that she appreciated being reminded about mindfulness practice and said "I need to get back to this."  Reflected on how she used to enjoy taking 30 minute walks.  Described how she used to imagine the sweat dripping off her body as "getting rid of the bad" in her life.    Continues to experience feelings of anxiety most of the day.    Suicidal/Homicidal: denies both   Plan: Scheduled to return in approximately one month.  Diagnosis: Generalized Anxiety Disorder      Darrin Luis 02/28/2015

## 2015-02-28 NOTE — Progress Notes (Signed)
[facility] MD Progress Note  02/28/2015 3:05 PM GRACEE RATTERREE  MRN:  [id number] Subjective:  Mallory [name] is in to follow up on her GAD and marital conflict. She is taking her Effexor XR and doing well and has no new complaints. No new symptoms. She is not sleeping as well as she would like, her mother had some catarac surgery, but her memory is fading, and is showing some early signs of dementia. Principal Problem: GAD Diagnosis:   Patient Active Problem List   Diagnosis Date Noted  . Menopause [Z78.0] 12/03/2014  . Marital conflict [Z63.0] 06/21/2014  . GAD (generalized anxiety disorder) [F41.1]   . GERD (gastroesophageal reflux disease) [K21.9]   . Peri-menopause [N95.1]   . Headache [R51]   . Generalized anxiety disorder [F41.1] 03/05/2013   Total Time spent with patient: 30 minutes   Past Medical History:  Past Medical History  Diagnosis Date  . GERD (gastroesophageal reflux disease)   . Peri-menopause   . Headache   . Hx of cardiovascular stress test     ETT-Myoview (03/2014):  EF 58%, no ischemia; normal  . GAD (generalized anxiety disorder)   . Anxiety     Past Surgical History  Procedure Laterality Date  . Cesarean section    . Dilitation & currettage/hystroscopy with essure     Family History:  Family History  Problem Relation Age of Onset  . Coronary artery disease Maternal Grandfather   . Cancer Maternal Grandfather   . Coronary artery disease Maternal Grandmother   . Cancer Maternal Grandmother   . Coronary artery disease Paternal Grandfather   . Cancer Paternal Grandfather   . Coronary artery disease Paternal Grandmother   . Cancer Paternal Grandmother   . Diabetes Cousin   . Alcohol abuse Neg Hx   . Anxiety disorder Neg Hx   . Bipolar disorder Neg Hx   . Depression Neg Hx   . Drug abuse Neg Hx   . OCD Neg Hx   . Schizophrenia Neg Hx   . ADD / ADHD Neg Hx    Social History:  History  Alcohol Use No     History  Drug Use No    History   Social History   . Marital Status: Married    Spouse Name: N/A  . Number of Children: N/A  . Years of Education: N/A   Social History Main Topics  . Smoking status: Current Every Day Smoker -- 0.50 packs/day for 39 years    Types: Cigarettes  . Smokeless tobacco: Never Used  . Alcohol Use: No  . Drug Use: No  . Sexual Activity: No   Other Topics Concern  . None   Social History Narrative   Additional History:    Sleep: Poor  Appetite:  Fair   Assessment:   Musculoskeletal: Strength & Muscle Tone: within normal limits Gait & Station: normal Patient leans: N/A   Psychiatric Specialty Exam: Physical Exam  ROS  Blood pressure 128/68, pulse 70, height  (1.575 m), weight 151 lb (68.493 kg), last menstrual period 11/11/2012, SpO2 96 %.Body mass index is 27.61 kg/(m^2).  General Appearance: Well Groomed  Patent attorney::  Good  Speech:  Clear and Coherent  Volume:  Normal  Mood:  Anxious  Affect:  Appropriate and Congruent  Thought Process:  Coherent and Goal Directed  Orientation:  Full (Time, Place, and Person)  Thought Content:  WDL  Suicidal Thoughts:  No  Homicidal Thoughts:  No  Memory:  NA  Judgement:  Intact  Insight:  Good and Present  Psychomotor Activity:  Normal  Concentration:  Good  Recall:  Good  Fund of Knowledge:Good  Language: Good  Akathisia:  No  Handed:  Right  AIMS (if indicated):     Assets:  Communication Skills Desire for Improvement Financial Resources/Insurance Housing Leisure Time Physical Health Resilience Social Support Talents/Skills Transportation  ADL's:  Intact  Cognition: WNL  Sleep:        Current Medications: Current Outpatient Prescriptions  Medication Sig Dispense Refill  . cyclobenzaprine (FLEXERIL) 10 MG tablet   0  . dexlansoprazole (DEXILANT) 60 MG capsule Take 60 mg by mouth daily.    Marland Kitchen estradiol (VIVELLE-DOT) 0.1 MG/24HR patch Place 1 patch onto the skin.    . fexofenadine (ALLEGRA) 180 MG tablet Take 180 mg by  mouth daily.    . fluticasone (FLONASE) 50 MCG/ACT nasal spray Place 2 sprays into the nose.    . hydrOXYzine (ATARAX/VISTARIL) 10 MG tablet Take 1 tablet (10 mg total) by mouth as needed for anxiety. 30 tablet 2  . MINIVELLE 0.05 MG/24HR Place 1 patch onto the skin daily.    . progesterone (PROMETRIUM) 100 MG capsule   1  . venlafaxine XR (EFFEXOR-XR) 150 MG 24 hr capsule Take 2 capsules (300 mg total) by mouth daily with breakfast. 60 capsule 2  . zonisamide (ZONEGRAN) 25 MG capsule   1   No current facility-administered medications for this visit.    Lab Results: No results found for this or any previous visit (from the past 48 hour(s)).  Physical Findings: AIMS:  CIWA:   COWS:    Treatment Plan Summary: Medication management   Medical Decision Making:  Established Problem, Stable/Improving (1) and Review of Psycho-Social Stressors (1)  Follow up x 6 months. Rx as written. Continue with OPT.   Rona Ravens. Wynee Matarazzo RPAC 3:09 PM 02/28/2015

## 2015-02-28 NOTE — Patient Instructions (Signed)
1. Continue all medication as ordered. 2. Call this office if you have any questions or concerns. 3. Continue to get regular exercise 3-5 times a week. 4. Continue to eat a healthy nutritionally balanced diet. 5. Continue to reduce stress and anxiety through activities such as yoga, mindfulness, meditation and or prayer. 6. Keep all appointments with your out patient therapist and have notes forwarded to this office. (If you do not have one and would like to be scheduled with a therapist, please let our office assist you with this. 7. Follow up as planned 6 months. 

## 2015-03-05 ENCOUNTER — Ambulatory Visit (HOSPITAL_COMMUNITY): Payer: Self-pay | Admitting: Physician Assistant

## 2015-03-27 ENCOUNTER — Ambulatory Visit (INDEPENDENT_AMBULATORY_CARE_PROVIDER_SITE_OTHER): Payer: 59 | Admitting: Licensed Clinical Social Worker

## 2015-03-27 DIAGNOSIS — F411 Generalized anxiety disorder: Secondary | ICD-10-CM | POA: Diagnosis not present

## 2015-03-28 NOTE — Progress Notes (Signed)
THERAPIST PROGRESS NOTE  Session Time: 4:05pm-5:00pm  Participation Level: Active  Behavioral Response: Well Groomed Alert Euthymic  Type of Therapy: Individual Therapy  Treatment Goals addressed: Anxiety  Interventions: Treatment progress review  Therapist Intevention:  Discussed how patient is coping with current stressors.   Reviewed progress with treatment goal to reduce feelings of anxiety.  Determined that patient has achieved that goal.  Asked patient if there was anything else of concern she wanted to address in therapy.    Summary:  Expressed satisfaction with how she has been coping with anxiety.  Talked about how taking time away from her husband either doing preferred activities or spending time with friends has been especially helpful.  Reported husband seems to understand she needs space at times.  Reported that most of the time she does feel a sense of calm.   Did not identify any specific concerns to further address in therapy.  Requested one more session next month as it is the anniversary of her dad's death.         Suicidal/Homicidal: denies both   Plan: Scheduled to return in approximately one month.  Will determine at that time if any further therapy is needed.  Diagnosis: Generalized Anxiety Disorder      Darrin LuisSolomon, Sarah A, LCSW 03/27/2015

## 2015-03-31 ENCOUNTER — Ambulatory Visit (HOSPITAL_COMMUNITY): Payer: Self-pay | Admitting: Licensed Clinical Social Worker

## 2015-04-08 ENCOUNTER — Other Ambulatory Visit: Payer: Self-pay | Admitting: Obstetrics & Gynecology

## 2015-04-09 LAB — CYTOLOGY - PAP

## 2015-05-08 ENCOUNTER — Ambulatory Visit (INDEPENDENT_AMBULATORY_CARE_PROVIDER_SITE_OTHER): Payer: 59 | Admitting: Licensed Clinical Social Worker

## 2015-05-08 DIAGNOSIS — F411 Generalized anxiety disorder: Secondary | ICD-10-CM | POA: Diagnosis not present

## 2015-05-08 NOTE — Progress Notes (Signed)
THERAPIST PROGRESS NOTE  Session Time: 10:10am-11:05am  Participation Level: Active  Behavioral Response: Well Groomed Alert Euthymic  Type of Therapy: Individual Therapy  Treatment Goals addressed: Anxiety  Interventions: Setting boundaries  Therapist Intevention:  Discussed how patient has been coping with her daughter and son-in-law divorcing.  Acknowledged her need for some closure with her relationship with her son-in-law.  Encouraged her to communicate with him with the purpose of letting him know he will be missed and to establish some boundaries with their continued communication.  Informed patient of an opportunity to participate in a weekly group with a focus on learning mindfulness skills.     Summary:  Indicated that she had gotten along well with her son-in-law and that she would prefer not to completely stop communicating with him.  Reported her daughter has requested that she not post comments to him on facebook.  Says she can respect this request.  Plans to send him a private message.         Expressed interest in participating in the group.    Suicidal/Homicidal: denies both   Plan:May attend 1st group session on Wednesday September 14th.   Diagnosis: Generalized Anxiety Disorder      Darrin Luis 05/08/2015

## 2015-05-28 ENCOUNTER — Ambulatory Visit (HOSPITAL_COMMUNITY): Payer: Self-pay | Admitting: Licensed Clinical Social Worker

## 2015-06-04 ENCOUNTER — Ambulatory Visit (HOSPITAL_COMMUNITY): Payer: Self-pay | Admitting: Licensed Clinical Social Worker

## 2015-06-11 ENCOUNTER — Ambulatory Visit (HOSPITAL_COMMUNITY): Payer: Self-pay | Admitting: Licensed Clinical Social Worker

## 2015-06-18 ENCOUNTER — Ambulatory Visit (HOSPITAL_COMMUNITY): Payer: Self-pay | Admitting: Licensed Clinical Social Worker

## 2015-06-20 ENCOUNTER — Ambulatory Visit (INDEPENDENT_AMBULATORY_CARE_PROVIDER_SITE_OTHER): Payer: 59 | Admitting: Licensed Clinical Social Worker

## 2015-06-20 DIAGNOSIS — F411 Generalized anxiety disorder: Secondary | ICD-10-CM

## 2015-06-20 NOTE — Progress Notes (Signed)
THERAPIST PROGRESS NOTE  Session Time: 11:10am-12:05pm  Participation Level: Active  Behavioral Response: Well Groomed Alert Anxious  Type of Therapy: Individual Therapy  Treatment Goals addressed: Anxiety  Interventions: CBT  Therapist Intevention:  Helped patient identify some of her core beliefs which are acting as a barrier towards making changes.  Explored many different ways patient avoids conflict and how that is keeping her stuck.  Explained that part of learning to cope effectively with anxiety involves facing your fears so that you can get to the point where you realize you can handle anxiety provoking situations.   Encouraged patient to reconsider participating in the weekly mindfulness group.        Summary:  Acknowledged that many of the choices she makes are in an effort to avoid conflict with her husband.  Identified the following beliefs: If I allow myself to relax, my guard will be down and I will be criticized by my husband. I won't be able to tolerate it if he criticizes me or my family, so I should keep my thoughts and feelings to myself. Reflected on how she was teased throughout childhood by her peers.  Noted this experience likely contributed to the development of those particular core beliefs. States that she will start attending the weekly group therapy sessions.       Suicidal/Homicidal: denies both   Plan:Will participate in MBCT group on Wednesday mornings.  Diagnosis: Generalized Anxiety Disorder      Darrin Luis 06/20/2015

## 2015-06-25 ENCOUNTER — Ambulatory Visit (HOSPITAL_COMMUNITY): Payer: Self-pay | Admitting: Licensed Clinical Social Worker

## 2015-07-02 ENCOUNTER — Ambulatory Visit (HOSPITAL_COMMUNITY): Payer: Self-pay | Admitting: Licensed Clinical Social Worker

## 2015-07-09 ENCOUNTER — Ambulatory Visit (HOSPITAL_COMMUNITY): Payer: Self-pay | Admitting: Licensed Clinical Social Worker

## 2015-07-16 ENCOUNTER — Ambulatory Visit (HOSPITAL_COMMUNITY): Payer: Self-pay | Admitting: Licensed Clinical Social Worker

## 2015-07-23 ENCOUNTER — Ambulatory Visit (HOSPITAL_COMMUNITY): Payer: Self-pay | Admitting: Licensed Clinical Social Worker

## 2015-07-30 ENCOUNTER — Ambulatory Visit (HOSPITAL_COMMUNITY): Payer: Self-pay | Admitting: Licensed Clinical Social Worker

## 2015-08-06 ENCOUNTER — Ambulatory Visit (HOSPITAL_COMMUNITY): Payer: Self-pay | Admitting: Licensed Clinical Social Worker

## 2015-08-13 ENCOUNTER — Ambulatory Visit (HOSPITAL_COMMUNITY): Payer: Self-pay | Admitting: Licensed Clinical Social Worker

## 2015-08-20 ENCOUNTER — Ambulatory Visit (HOSPITAL_COMMUNITY): Payer: Self-pay | Admitting: Licensed Clinical Social Worker

## 2015-08-27 ENCOUNTER — Ambulatory Visit (HOSPITAL_COMMUNITY): Payer: Self-pay | Admitting: Licensed Clinical Social Worker

## 2015-09-03 ENCOUNTER — Ambulatory Visit (HOSPITAL_COMMUNITY): Payer: Self-pay | Admitting: Licensed Clinical Social Worker

## 2015-09-10 ENCOUNTER — Ambulatory Visit (HOSPITAL_COMMUNITY): Payer: Self-pay | Admitting: Licensed Clinical Social Worker

## 2015-10-07 ENCOUNTER — Encounter (HOSPITAL_COMMUNITY): Payer: Self-pay | Admitting: Psychiatry

## 2015-10-07 ENCOUNTER — Ambulatory Visit (INDEPENDENT_AMBULATORY_CARE_PROVIDER_SITE_OTHER): Payer: 59 | Admitting: Psychiatry

## 2015-10-07 VITALS — BP 128/70 | HR 72 | Ht 62.0 in | Wt 147.0 lb

## 2015-10-07 DIAGNOSIS — Z630 Problems in relationship with spouse or partner: Secondary | ICD-10-CM | POA: Diagnosis not present

## 2015-10-07 DIAGNOSIS — F341 Dysthymic disorder: Secondary | ICD-10-CM

## 2015-10-07 DIAGNOSIS — F411 Generalized anxiety disorder: Secondary | ICD-10-CM

## 2015-10-07 MED ORDER — VENLAFAXINE HCL ER 150 MG PO CP24: 300.0000 mg | ORAL_CAPSULE | Freq: Every day | ORAL | Status: DC

## 2015-10-07 MED ORDER — HYDROXYZINE HCL 10 MG PO TABS: 10.0000 mg | ORAL_TABLET | ORAL | Status: DC | PRN

## 2015-10-07 NOTE — Progress Notes (Signed)
Patient ID: JAISA DEFINO, female   DOB: [birth year], 60 y.o.   MRN: [id number] [facility] MD Progress Note  10/07/2015 10:29 AM LEIGHTYN CINA  MRN:  [id number] Subjective:  Mallory [name] is in to follow up on her GAD and marital conflict. She is taking her Effexor XR. She has seen Verne Spurr before.  She presents with a long history of marital conflicts. Says her husband is bipolar and becomes very difficult dealing with him. 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 She endorses low-grade depression that is also relevant to her concern with her husband. Anxiety wise she is tolerating medications she is not feeling extremely anxious but there are times that she gets anxious and she worries Depression when she is not unstable she is functioning and is not hopeless or suicidal Relationship concerns remain a main  aggravating factor. Modifying factor: other supportive friends and her job  Denies psychotic symptoms, mania or hypomania. Denies drug use Sleep, energy level or reasonable   Principal Problem: GAD Diagnosis:   Patient Active Problem List   Diagnosis Date Noted  . Menopause [Z78.0] 12/03/2014  . Marital conflict [Z63.0] 06/21/2014  . GAD (generalized anxiety disorder) [F41.1]   . GERD (gastroesophageal reflux disease) [K21.9]   . Peri-menopause [N95.1]   . Headache [R51]   . Generalized anxiety disorder [F41.1] 03/05/2013   Total Time spent with patient: 30 minutes   Past Medical History:  Past Medical History  Diagnosis Date  . GERD (gastroesophageal reflux disease)   . Peri-menopause   . Headache   . Hx of cardiovascular stress test     ETT-Myoview (03/2014):  EF 58%, no ischemia; normal  . GAD (generalized anxiety disorder)   . Anxiety     Past Surgical History  Procedure Laterality Date  . Cesarean section    . Dilitation & currettage/hystroscopy with essure     Family History:  Family History  Problem Relation Age of Onset  . Coronary artery  disease Maternal Grandfather   . Cancer Maternal Grandfather   . Coronary artery disease Maternal Grandmother   . Cancer Maternal Grandmother   . Coronary artery disease Paternal Grandfather   . Cancer Paternal Grandfather   . Coronary artery disease Paternal Grandmother   . Cancer Paternal Grandmother   . Diabetes Cousin   . Alcohol abuse Neg Hx   . Anxiety disorder Neg Hx   . Bipolar disorder Neg Hx   . Depression Neg Hx   . Drug abuse Neg Hx   . OCD Neg Hx   . Schizophrenia Neg Hx   . ADD / ADHD Neg Hx    Social History:  History  Alcohol Use No     History  Drug Use No    Social History   Social History  . Marital Status: Married    Spouse Name: N/A  . Number of Children: N/A  . Years of Education: N/A   Social History Main Topics  . Smoking status: Current Every Day Smoker -- 0.50 packs/day for 39 years    Types: Cigarettes  . Smokeless tobacco: Never Used  . Alcohol Use: No  . Drug Use: No  . Sexual Activity: No   Other Topics Concern  . None   Social History Narrative   Additional History:    Sleep: Poor  Appetite:  Fair   Assessment:   Musculoskeletal: Strength & Muscle Tone: within normal limits Gait & Station: normal Patient leans: N/A   Psychiatric  Specialty Exam: Physical Exam  ROS  Blood pressure 128/70, pulse 72, height  (1.575 m), weight 147 lb (66.679 kg), last menstrual period 11/11/2012, SpO2 95 %.Body mass index is 26.88 kg/(m^2).  General Appearance: Well Groomed  Patent attorney::  Good  Speech:  Clear and Coherent  Volume:  Normal  Mood:  Somewhat anxious  Affect:  Appropriate and Congruent  Thought Process:  Coherent and Goal Directed  Orientation:  Full (Time, Place, and Person)  Thought Content:  WDL  Suicidal Thoughts:  No  Homicidal Thoughts:  No  Memory:  NA  Judgement:  Intact  Insight:  Good and Present  Psychomotor Activity:  Normal  Concentration:  Good  Recall:  Good  Fund of Knowledge:Good  Language:  Good  Akathisia:  No  Handed:  Right  AIMS (if indicated):     Assets:  Communication Skills Desire for Improvement Financial Resources/Insurance Housing Leisure Time Physical Health Resilience Social Support Talents/Skills Transportation  ADL's:  Intact  Cognition: WNL  Sleep:        Current Medications: Current Outpatient Prescriptions  Medication Sig Dispense Refill  . cyclobenzaprine (FLEXERIL) 10 MG tablet Reported on 10/07/2015  0  . dexlansoprazole (DEXILANT) 60 MG capsule Take 60 mg by mouth daily.    Marland Kitchen estradiol (VIVELLE-DOT) 0.1 MG/24HR patch Place 1 patch onto the skin.    . fexofenadine (ALLEGRA) 180 MG tablet Take 180 mg by mouth daily.    . fluticasone (FLONASE) 50 MCG/ACT nasal spray Place 2 sprays into the nose.    . hydrOXYzine (ATARAX/VISTARIL) 10 MG tablet Take 1 tablet (10 mg total) by mouth as needed for anxiety. 30 tablet 3  . progesterone (PROMETRIUM) 100 MG capsule   1  . venlafaxine XR (EFFEXOR-XR) 150 MG 24 hr capsule Take 2 capsules (300 mg total) by mouth daily with breakfast. 60 capsule 3  . MINIVELLE 0.05 MG/24HR Place 1 patch onto the skin daily. Reported on 10/07/2015    . zonisamide (ZONEGRAN) 25 MG capsule Reported on 10/07/2015  1   No current facility-administered medications for this visit.    Lab Results: No results found for this or any previous visit (from the past 48 hour(s)).  Physical Findings: AIMS:  CIWA:   COWS:    Treatment Plan Summary: Medication management  Continue Effexor at a dose of . No reported side effects for anxiety and depression. She is not interested to increase the dose Continue Vistaril 10 mg when necessary for anxiety In regarding to marital discord I would recommend psychotherapy she was so started back with [place] [employer] try to keep himself busy even at home with other activities or hobbies She is not ready to quit smoking she does understands the risk and counseling done  Reviewed sleep  hygiene More than 50% spent in counseling and coordination of care including patient education  Follow-up in 3-4 months or earlier if needed   10:29 AM 10/07/2015

## 2015-10-20 ENCOUNTER — Ambulatory Visit (HOSPITAL_COMMUNITY): Payer: 59 | Admitting: Licensed Clinical Social Worker

## 2015-11-19 ENCOUNTER — Other Ambulatory Visit: Payer: Self-pay | Admitting: Radiology

## 2016-02-04 ENCOUNTER — Ambulatory Visit (HOSPITAL_COMMUNITY): Payer: Self-pay | Admitting: Psychiatry

## 2016-04-02 ENCOUNTER — Ambulatory Visit (INDEPENDENT_AMBULATORY_CARE_PROVIDER_SITE_OTHER): Payer: 59 | Admitting: Psychiatry

## 2016-04-02 ENCOUNTER — Encounter (HOSPITAL_COMMUNITY): Payer: Self-pay | Admitting: Psychiatry

## 2016-04-02 DIAGNOSIS — Z630 Problems in relationship with spouse or partner: Secondary | ICD-10-CM

## 2016-04-02 DIAGNOSIS — F341 Dysthymic disorder: Secondary | ICD-10-CM | POA: Diagnosis not present

## 2016-04-02 DIAGNOSIS — F411 Generalized anxiety disorder: Secondary | ICD-10-CM

## 2016-04-02 MED ORDER — VENLAFAXINE HCL ER 150 MG PO CP24: 300.0000 mg | ORAL_CAPSULE | Freq: Every day | ORAL | Status: DC

## 2016-04-02 MED ORDER — HYDROXYZINE HCL 10 MG PO TABS: 10.0000 mg | ORAL_TABLET | Freq: Two times a day (BID) | ORAL | Status: DC | PRN

## 2016-04-02 NOTE — Progress Notes (Signed)
Patient ID: Mallory DewCarol D Wilhelmsen, female   DOB: 1956-09-10, 60 y.o.   MRN: [id number] [facility]  04/02/2016 11:26 AM Mallory [name]  MRN:  [id number] Subjective:  Okey RegalCarol is in to follow up on her GAD and marital conflict. She is taking her Effexor XR. She has seen Verne SpurrNeil Mashburn before.  She presents with a long history of marital conflicts. Says her husband is bipolar and becomes very difficult dealing with him. 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 She feels he is very controlling he cannot give her a break where she cannot do any activities outside home and that upsets her she is thinking of ending the marriage but they have been together and it is difficult to make a decision  She lives medication helping but does not want to increase it. She takes Vistaril sometime more than 1 tablet because she is to take care of her mom and also take care of an elderly lady who is 60 years of age Anxiety wise she is tolerating medications . Anxiety relevant to her home situation.  Depression when she is not unstable she is functioning and is not hopeless or suicidal. She does get overwhelmed. Relationship concerns remain a main  aggravating factor. Modifying factor: other supportive friends and her job  Denies psychotic symptoms, mania or hypomania. Denies drug use Sleep, energy level or reasonable   Principal Problem: GAD Diagnosis:   Patient Active Problem List   Diagnosis Date Noted  . Menopause [Z78.0] 12/03/2014  . Marital conflict [Z63.0] 06/21/2014  . GAD (generalized anxiety disorder) [F41.1]   . GERD (gastroesophageal reflux disease) [K21.9]   . Peri-menopause [N95.1]   . Headache [R51]   . Generalized anxiety disorder [F41.1] 03/05/2013   Total Time spent with patient: 30 minutes   Past Medical History:  Past Medical History  Diagnosis Date  . GERD (gastroesophageal reflux disease)   . Peri-menopause   . Headache   . Hx of cardiovascular stress  test     ETT-Myoview (03/2014):  EF 58%, no ischemia; normal  . GAD (generalized anxiety disorder)   . Anxiety     Past Surgical History  Procedure Laterality Date  . Cesarean section    . Dilitation & currettage/hystroscopy with essure     Family History:  Family History  Problem Relation Age of Onset  . Coronary artery disease Maternal Grandfather   . Cancer Maternal Grandfather   . Coronary artery disease Maternal Grandmother   . Cancer Maternal Grandmother   . Coronary artery disease Paternal Grandfather   . Cancer Paternal Grandfather   . Coronary artery disease Paternal Grandmother   . Cancer Paternal Grandmother   . Diabetes Cousin   . Alcohol abuse Neg Hx   . Anxiety disorder Neg Hx   . Bipolar disorder Neg Hx   . Depression Neg Hx   . Drug abuse Neg Hx   . OCD Neg Hx   . Schizophrenia Neg Hx   . ADD / ADHD Neg Hx    Social History:  History  Alcohol Use No     History  Drug Use No    Social History   Social History  . Marital Status: Married    Spouse Name: N/A  . Number of Children: N/A  . Years of Education: N/A   Social History Main Topics  . Smoking status: Current Every Day Smoker -- 0.50 packs/day for 39 years    Types: Cigarettes  .  Smokeless tobacco: Never Used  . Alcohol Use: No  . Drug Use: No  . Sexual Activity: No   Other Topics Concern  . None   Social History Narrative   Additional History:    Sleep: Poor  Appetite:  Fair   Assessment:   Musculoskeletal: Strength & Muscle Tone: within normal limits Gait & Station: normal Patient leans: N/A   Psychiatric Specialty Exam: Physical Exam  ROS  Last menstrual period 11/11/2012.There is no weight on file to calculate BMI.  General Appearance: Well Groomed  Patent attorney::  Good  Speech:  Clear and Coherent  Volume:  Normal  Mood:  Somewhat dysphoric  Affect:  Appropriate and Congruent  Thought Process:  Coherent and Goal Directed  Orientation:  Full (Time, Place, and  Person)  Thought Content:  WDL  Suicidal Thoughts:  No  Homicidal Thoughts:  No  Memory:  NA  Judgement:  Intact  Insight:  Good and Present  Psychomotor Activity:  Normal  Concentration:  Good  Recall:  Good  Fund of Knowledge:Good  Language: Good  Akathisia:  No  Handed:  Right  AIMS (if indicated):     Assets:  Communication Skills Desire for Improvement Financial Resources/Insurance Housing Leisure Time Physical Health Resilience Social Support Talents/Skills Transportation  ADL's:  Intact  Cognition: WNL  Sleep:        Current Medications: Current Outpatient Prescriptions  Medication Sig Dispense Refill  . cyclobenzaprine (FLEXERIL) 10 MG tablet Reported on 10/07/2015  0  . dexlansoprazole (DEXILANT) 60 MG capsule Take 60 mg by mouth daily.    Marland Kitchen estradiol (VIVELLE-DOT) 0.1 MG/24HR patch Place 1 patch onto the skin.    . fexofenadine (ALLEGRA) 180 MG tablet Take 180 mg by mouth daily.    . fluticasone (FLONASE) 50 MCG/ACT nasal spray Place 2 sprays into the nose.    . hydrOXYzine (ATARAX/VISTARIL) 10 MG tablet Take 1 tablet (10 mg total) by mouth 2 (two) times daily as needed for anxiety. 45 tablet 3  . MINIVELLE 0.05 MG/24HR Place 1 patch onto the skin daily. Reported on 10/07/2015    . progesterone (PROMETRIUM) 100 MG capsule   1  . venlafaxine XR (EFFEXOR-XR) 150 MG 24 hr capsule Take 2 capsules (300 mg total) by mouth daily with breakfast. 60 capsule 3  . zonisamide (ZONEGRAN) 25 MG capsule Reported on 10/07/2015  1   No current facility-administered medications for this visit.    Lab Results: No results found for this or any previous visit (from the past 48 hour(s)).  Physical Findings: AIMS:  CIWA:   COWS:    Treatment Plan Summary: Medication management  Depression and anxiety: Continue Effexor at a dose of . No reported side effects. She is not interested to increase the dose Continue Vistaril 10 mg when necessary for anxiety but will  increase to take bid prn if needed. In regarding to marital discord I would recommend psychotherapy she was so started back with Maralyn Sago. Does not want to do marital counselling She'll try to keep himself busy even at home with other activities or hobbies   Reviewed sleep hygiene More than 50% spent in counseling and coordination of care including patient education  Follow-up in 3 months or earlier if needed Time spent: 25 minutes  11:26 AM 04/02/2016

## 2016-06-18 ENCOUNTER — Ambulatory Visit (HOSPITAL_COMMUNITY): Payer: Self-pay | Admitting: Psychiatry

## 2016-06-30 ENCOUNTER — Encounter (HOSPITAL_COMMUNITY): Payer: Self-pay | Admitting: Psychiatry

## 2016-06-30 ENCOUNTER — Ambulatory Visit (INDEPENDENT_AMBULATORY_CARE_PROVIDER_SITE_OTHER): Payer: 59 | Admitting: Psychiatry

## 2016-06-30 VITALS — BP 130/72 | HR 66 | Ht 62.0 in | Wt 133.0 lb

## 2016-06-30 DIAGNOSIS — Z813 Family history of other psychoactive substance abuse and dependence: Secondary | ICD-10-CM

## 2016-06-30 DIAGNOSIS — Z811 Family history of alcohol abuse and dependence: Secondary | ICD-10-CM

## 2016-06-30 DIAGNOSIS — Z833 Family history of diabetes mellitus: Secondary | ICD-10-CM

## 2016-06-30 DIAGNOSIS — F341 Dysthymic disorder: Secondary | ICD-10-CM

## 2016-06-30 DIAGNOSIS — Z630 Problems in relationship with spouse or partner: Secondary | ICD-10-CM

## 2016-06-30 DIAGNOSIS — Z809 Family history of malignant neoplasm, unspecified: Secondary | ICD-10-CM

## 2016-06-30 DIAGNOSIS — F411 Generalized anxiety disorder: Secondary | ICD-10-CM | POA: Diagnosis not present

## 2016-06-30 DIAGNOSIS — Z818 Family history of other mental and behavioral disorders: Secondary | ICD-10-CM

## 2016-06-30 DIAGNOSIS — F17210 Nicotine dependence, cigarettes, uncomplicated: Secondary | ICD-10-CM

## 2016-06-30 MED ORDER — HYDROXYZINE HCL 10 MG PO TABS: 10.0000 mg | ORAL_TABLET | Freq: Two times a day (BID) | ORAL | 2 refills | Status: DC | PRN

## 2016-06-30 MED ORDER — VENLAFAXINE HCL ER 150 MG PO CP24: 300.0000 mg | ORAL_CAPSULE | Freq: Every day | ORAL | 2 refills | Status: DC

## 2016-06-30 NOTE — Progress Notes (Signed)
Patient ID: Mallory [name], female   DOB: December 11, 1955, 60 y.o.   MRN: [id number] [facility] MD Progress Note  06/30/2016 1:58 PM Mallory [name]  MRN:  [id number] Subjective:  Mallory [name] is in to follow up on her GAD and marital conflict. She is taking her Effexor XR. She has seen Verne Spurr before.  She presented initially  with a long history of marital conflicts. Says her husband is bipolar and becomes very difficult dealing with him. 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 She feels he is very controlling he cannot give her a break where she cannot do any activities outside home and that upsets her she is thinking of ending the marriage but they have been together and it is difficult to make a decision  Last visit we added Vistaril 10 mg twice a that helps her calm down. Currently she stressed out because her husband has been diagnosed with cancer. Her mother has been diagnosed with Alzheimer's. She gets overwhelmed her apparent family members want her to spend more time with her husband but she understands she is to take some time away to keep herself connected with life.  Relationship concerns remain a main  aggravating factor. Husband sick.  Modifying factor: other supportive friends and her job  Denies psychotic symptoms, mania or hypomania. Denies drug use Sleep, energy level or reasonable   Principal Problem: GAD Diagnosis:   Patient Active Problem List   Diagnosis Date Noted  . Menopause [Z78.0] 12/03/2014  . Marital conflict [Z63.0] 06/21/2014  . GAD (generalized anxiety disorder) [F41.1]   . GERD (gastroesophageal reflux disease) [K21.9]   . Peri-menopause [N95.1]   . Headache [R51]   . Generalized anxiety disorder [F41.1] 03/05/2013   Total Time spent with patient: 30 minutes   Past Medical History:  Past Medical History:  Diagnosis Date  . Anxiety   . GAD (generalized anxiety disorder)   . GERD (gastroesophageal reflux disease)   . Headache   .  Hx of cardiovascular stress test    ETT-Myoview (03/2014):  EF 58%, no ischemia; normal  . Peri-menopause     Past Surgical History:  Procedure Laterality Date  . CESAREAN SECTION    . DILITATION & CURRETTAGE/HYSTROSCOPY WITH ESSURE     Family History:  Family History  Problem Relation Age of Onset  . Coronary artery disease Maternal Grandfather   . Cancer Maternal Grandfather   . Coronary artery disease Maternal Grandmother   . Cancer Maternal Grandmother   . Coronary artery disease Paternal Grandfather   . Cancer Paternal Grandfather   . Coronary artery disease Paternal Grandmother   . Cancer Paternal Grandmother   . Diabetes Cousin   . Alcohol abuse Neg Hx   . Anxiety disorder Neg Hx   . Bipolar disorder Neg Hx   . Depression Neg Hx   . Drug abuse Neg Hx   . OCD Neg Hx   . Schizophrenia Neg Hx   . ADD / ADHD Neg Hx    Social History:  History  Alcohol Use No     History  Drug Use No    Social History   Social History  . Marital status: Married    Spouse name: N/A  . Number of children: N/A  . Years of education: N/A   Social History Main Topics  . Smoking status: Current Every Day Smoker    Packs/day: 0.50    Years: 39.00    Types: Cigarettes  . Smokeless  tobacco: Never Used  . Alcohol use No  . Drug use: No  . Sexual activity: No   Other Topics Concern  . None   Social History Narrative  . None   Additional History:    Sleep: Poor  Appetite:  Fair   Assessment:   Musculoskeletal: Strength & Muscle Tone: within normal limits Gait & Station: normal Patient leans: N/A   Psychiatric Specialty Exam: Physical Exam  Review of Systems  Cardiovascular: Negative for chest pain and palpitations.  Psychiatric/Behavioral: Positive for depression.    Blood pressure 130/72, pulse 66, height 5\' 2"  (1.575 m), weight 133 lb (60.3 kg), last menstrual period 11/11/2012, SpO2 98 %.Body mass index is 24.33 kg/m.  General Appearance: Well Groomed  Proofreaderye  Contact::  Good  Speech:  Clear and Coherent  Volume:  Normal  Mood: gets overwhelm but not hopeless  Affect:  Appropriate and Congruent  Thought Process:  Coherent and Goal Directed  Orientation:  Full (Time, Place, and Person)  Thought Content:  WDL  Suicidal Thoughts:  No  Homicidal Thoughts:  No  Memory:  NA  Judgement:  Intact  Insight:  Good and Present  Psychomotor Activity:  Normal  Concentration:  Good  Recall:  Good  Fund of Knowledge:Good  Language: Good  Akathisia:  No  Handed:  Right  AIMS (if indicated):     Assets:  Communication Skills Desire for Improvement Financial Resources/Insurance Housing Leisure Time Physical Health Resilience Social Support Talents/Skills Transportation  ADL's:  Intact  Cognition: WNL  Sleep:        Current Medications: Current Outpatient Prescriptions  Medication Sig Dispense Refill  . cyclobenzaprine (FLEXERIL) 10 MG tablet Reported on 10/07/2015  0  . dexlansoprazole (DEXILANT) 60 MG capsule Take 60 mg by mouth daily.    Marland Kitchen. estradiol (VIVELLE-DOT) 0.1 MG/24HR patch Place 1 patch onto the skin.    . fexofenadine (ALLEGRA) 180 MG tablet Take 180 mg by mouth daily.    . fluticasone (FLONASE) 50 MCG/ACT nasal spray Place 2 sprays into the nose.    . hydrOXYzine (ATARAX/VISTARIL) 10 MG tablet Take 1 tablet (10 mg total) by mouth 2 (two) times daily as needed for anxiety. 45 tablet 2  . MINIVELLE 0.05 MG/24HR Place 1 patch onto the skin daily. Reported on 10/07/2015    . progesterone (PROMETRIUM) 100 MG capsule   1  . venlafaxine XR (EFFEXOR-XR) 150 MG 24 hr capsule Take 2 capsules (300 mg total) by mouth daily with breakfast. 60 capsule 2  . zonisamide (ZONEGRAN) 25 MG capsule Reported on 10/07/2015  1   No current facility-administered medications for this visit.     Lab Results: No results found for this or any previous visit (from the past 48 hour(s)).  Physical Findings: AIMS:  CIWA:   COWS:    Treatment Plan  Summary: Medication management  Depression and anxiety: Continue Effexor at a dose of 300milligram. No reported side effects. She is not interested to increase the dose Continue Vistaril 10 mg when necessary for anxiety she feels bid dose helps.   In regarding to marital discord I would recommend continue  psychotherapy she was so started back with Maralyn SagoSarah.  Coping skills and how to deal with sickness in family and mom.   Reviewed sleep hygiene More than 50% spent in counseling and coordination of care including patient education  Follow-up in 2-3 months or earlier if needed Time spent: 25 minutes  1:58 PM 06/30/2016

## 2016-08-26 ENCOUNTER — Encounter (HOSPITAL_COMMUNITY): Payer: Self-pay | Admitting: Psychiatry

## 2016-08-26 ENCOUNTER — Ambulatory Visit (INDEPENDENT_AMBULATORY_CARE_PROVIDER_SITE_OTHER): Payer: 59 | Admitting: Psychiatry

## 2016-08-26 VITALS — BP 126/72 | HR 70 | Resp 16 | Ht 62.0 in | Wt 136.0 lb

## 2016-08-26 DIAGNOSIS — F341 Dysthymic disorder: Secondary | ICD-10-CM

## 2016-08-26 DIAGNOSIS — Z9889 Other specified postprocedural states: Secondary | ICD-10-CM

## 2016-08-26 DIAGNOSIS — F411 Generalized anxiety disorder: Secondary | ICD-10-CM

## 2016-08-26 DIAGNOSIS — Z808 Family history of malignant neoplasm of other organs or systems: Secondary | ICD-10-CM

## 2016-08-26 DIAGNOSIS — Z8489 Family history of other specified conditions: Secondary | ICD-10-CM

## 2016-08-26 DIAGNOSIS — Z630 Problems in relationship with spouse or partner: Secondary | ICD-10-CM | POA: Diagnosis not present

## 2016-08-26 DIAGNOSIS — F17210 Nicotine dependence, cigarettes, uncomplicated: Secondary | ICD-10-CM

## 2016-08-26 MED ORDER — BUPROPION HCL ER (SR) 100 MG PO TB12: 100.0000 mg | ORAL_TABLET | Freq: Every day | ORAL | 1 refills | Status: DC

## 2016-08-26 MED ORDER — VENLAFAXINE HCL ER 150 MG PO CP24: 300.0000 mg | ORAL_CAPSULE | Freq: Every day | ORAL | 2 refills | Status: DC

## 2016-08-26 MED ORDER — HYDROXYZINE HCL 10 MG PO TABS: 10.0000 mg | ORAL_TABLET | Freq: Two times a day (BID) | ORAL | 2 refills | Status: DC | PRN

## 2016-08-26 NOTE — Progress Notes (Signed)
Patient ID: Mallory DewCarol D Lyford, female   DOB: [birth year], 60 y.o.   MRN: [id number] [facility]  08/26/2016 10:32 AM Mallory [name]  MRN:  [id number] Subjective:  Mallory [name] is in to follow up on her GAD and marital conflict. She is taking her Effexor XR. She has seen Verne SpurrNeil Mashburn before.  She presented initially  with a long history of marital conflicts. Says her husband is bipolar and becomes very difficult dealing with him. 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 She feels he is very controlling he cannot give her a break where she cannot do any activities outside home and that upsets her she is thinking of ending the marriage but they have been together and it is difficult to make a decision  Depression is worse she is having conflicts with her daughter and husband order wants her to spend more with her dad and not talk to anybody else she already is caregiving and it becomes upsetting. Anxiety is also worsened. She takes Vistaril when necessary  Modifying factors some supportive friends. No psychotic symptoms denied drug use   Principal Problem: GAD Diagnosis:   Patient Active Problem List   Diagnosis Date Noted  . Menopause [Z78.0] 12/03/2014  . Marital conflict [Z63.0] 06/21/2014  . GAD (generalized anxiety disorder) [F41.1]   . GERD (gastroesophageal reflux disease) [K21.9]   . Peri-menopause [N95.1]   . Headache [R51]   . Generalized anxiety disorder [F41.1] 03/05/2013   Total Time spent with patient: 30 minutes   Past Medical History:  Past Medical History:  Diagnosis Date  . Anxiety   . GAD (generalized anxiety disorder)   . GERD (gastroesophageal reflux disease)   . Headache   . Hx of cardiovascular stress test    ETT-Myoview (03/2014):  EF 58%, no ischemia; normal  . Peri-menopause     Past Surgical History:  Procedure Laterality Date  . CESAREAN SECTION    . DILITATION & CURRETTAGE/HYSTROSCOPY WITH ESSURE     Family History:   Family History  Problem Relation Age of Onset  . Coronary artery disease Maternal Grandfather   . Cancer Maternal Grandfather   . Coronary artery disease Maternal Grandmother   . Cancer Maternal Grandmother   . Coronary artery disease Paternal Grandfather   . Cancer Paternal Grandfather   . Coronary artery disease Paternal Grandmother   . Cancer Paternal Grandmother   . Diabetes Cousin   . Alcohol abuse Neg Hx   . Anxiety disorder Neg Hx   . Bipolar disorder Neg Hx   . Depression Neg Hx   . Drug abuse Neg Hx   . OCD Neg Hx   . Schizophrenia Neg Hx   . ADD / ADHD Neg Hx    Social History:  History  Alcohol Use No     History  Drug Use No    Social History   Social History  . Marital status: Married    Spouse name: N/A  . Number of children: N/A  . Years of education: N/A   Social History Main Topics  . Smoking status: Current Every Day Smoker    Packs/day: 0.50    Years: 39.00    Types: Cigarettes  . Smokeless tobacco: Never Used  . Alcohol use No  . Drug use: No  . Sexual activity: No   Other Topics Concern  . None   Social History Narrative  . None     Psychiatric Specialty Exam: Physical Exam  Review of Systems  Gastrointestinal: Negative for nausea.  Psychiatric/Behavioral: Positive for depression.    Blood pressure 126/72, pulse 70, resp. rate 16, height 5\' 2"  (1.575 m), weight 136 lb (61.7 kg), last menstrual period 11/11/2012, SpO2 94 %.Body mass index is 24.87 kg/m.  General Appearance: Well Groomed  Patent attorneyye Contact::  Good  Speech:  Clear and Coherent  Volume:  Normal  Mood: depressed  Affect:  Appropriate and Congruent  Thought Process:  Coherent and Goal Directed  Orientation:  Full (Time, Place, and Person)  Thought Content:  WDL  Suicidal Thoughts:  No  Homicidal Thoughts:  No  Memory:  NA  Judgement:  Intact  Insight:  Good and Present  Psychomotor Activity:  Normal  Concentration:  Good  Recall:  Good  Fund of Knowledge:Good   Language: Good  Akathisia:  No  Handed:  Right  AIMS (if indicated):     Assets:  Communication Skills Desire for Improvement Financial Resources/Insurance Housing Leisure Time Physical Health Resilience Social Support Talents/Skills Transportation  ADL's:  Intact  Cognition: WNL  Sleep:        Current Medications: Current Outpatient Prescriptions  Medication Sig Dispense Refill  . buPROPion (WELLBUTRIN SR) 100 MG 12 hr tablet Take 1 tablet (100 mg total) by mouth daily. 30 tablet 1  . cyclobenzaprine (FLEXERIL) 10 MG tablet Reported on 10/07/2015  0  . dexlansoprazole (DEXILANT) 60 MG capsule Take 60 mg by mouth daily.    Marland Kitchen. estradiol (VIVELLE-DOT) 0.1 MG/24HR patch Place 1 patch onto the skin.    . fexofenadine (ALLEGRA) 180 MG tablet Take 180 mg by mouth daily.    . fluticasone (FLONASE) 50 MCG/ACT nasal spray Place 2 sprays into the nose.    . hydrOXYzine (ATARAX/VISTARIL) 10 MG tablet Take 1 tablet (10 mg total) by mouth 2 (two) times daily as needed for anxiety. 45 tablet 2  . MINIVELLE 0.05 MG/24HR Place 1 patch onto the skin daily. Reported on 10/07/2015    . progesterone (PROMETRIUM) 100 MG capsule   1  . venlafaxine XR (EFFEXOR-XR) 150 MG 24 hr capsule Take 2 capsules (300 mg total) by mouth daily with breakfast. 60 capsule 2  . zonisamide (ZONEGRAN) 25 MG capsule Reported on 10/07/2015  1   No current facility-administered medications for this visit.     Lab Results: No results found for this or any previous visit (from the past 48 hour(s)).  Physical Findings: AIMS:  CIWA:   COWS:    Treatment Plan Summary: Medication management  MDD; worsened. Add wellbutrin to effexor. 100mg   GAD: continue effexor and vistaril Relationship : conflicts with daughter and husband is sick Reviewed coping skills wanted medication education and recommend psychotherapy  Prescriptions sent.  FU 4 weeks   10:32 AM 08/26/2016

## 2016-10-19 ENCOUNTER — Ambulatory Visit (HOSPITAL_COMMUNITY): Payer: Self-pay | Admitting: Psychiatry

## 2016-12-30 ENCOUNTER — Ambulatory Visit (HOSPITAL_COMMUNITY): Payer: Self-pay | Admitting: Psychiatry

## 2017-01-06 ENCOUNTER — Ambulatory Visit (INDEPENDENT_AMBULATORY_CARE_PROVIDER_SITE_OTHER): Payer: 59 | Admitting: Psychiatry

## 2017-01-06 ENCOUNTER — Encounter (HOSPITAL_COMMUNITY): Payer: Self-pay | Admitting: Psychiatry

## 2017-01-06 ENCOUNTER — Ambulatory Visit (HOSPITAL_COMMUNITY): Payer: Self-pay | Admitting: Psychiatry

## 2017-01-06 VITALS — BP 124/72 | HR 96 | Resp 16 | Ht 62.0 in | Wt 139.0 lb

## 2017-01-06 DIAGNOSIS — Z630 Problems in relationship with spouse or partner: Secondary | ICD-10-CM

## 2017-01-06 DIAGNOSIS — F17210 Nicotine dependence, cigarettes, uncomplicated: Secondary | ICD-10-CM | POA: Diagnosis not present

## 2017-01-06 DIAGNOSIS — F341 Dysthymic disorder: Secondary | ICD-10-CM

## 2017-01-06 DIAGNOSIS — F411 Generalized anxiety disorder: Secondary | ICD-10-CM | POA: Diagnosis not present

## 2017-01-06 DIAGNOSIS — Z79899 Other long term (current) drug therapy: Secondary | ICD-10-CM

## 2017-01-06 MED ORDER — HYDROXYZINE HCL 10 MG PO TABS: 10.0000 mg | ORAL_TABLET | Freq: Two times a day (BID) | ORAL | 2 refills | Status: DC | PRN

## 2017-01-06 MED ORDER — BUPROPION HCL ER (SR) 100 MG PO TB12: 100.0000 mg | ORAL_TABLET | Freq: Every day | ORAL | 1 refills | Status: DC

## 2017-01-06 MED ORDER — VENLAFAXINE HCL ER 150 MG PO CP24: 300.0000 mg | ORAL_CAPSULE | Freq: Every day | ORAL | 2 refills | Status: DC

## 2017-01-06 NOTE — Progress Notes (Signed)
Patient ID: Mallory [name], female   DOB: March 21, 1956, 61 y.o.   MRN: [id number] [facility] MD Progress Note  01/06/2017 3:05 PM Mallory [name]  MRN:  [id number] Subjective:  Mallory [name] is in to follow up on her GAD and marital conflict. She is taking her Effexor XR. She has seen Verne Spurr before.   She presented initially  with a long history of marital conflicts. Says her husband is bipolar and becomes very difficult dealing with him.  Patient's husband has gone through chemotherapy for prostate cancer he still remains negative and sarcastic that affects her relationship. Patient was not able to pick up Wellbutrin that was started last time for depression so we talked about pending about this time. Otherwise she is tolerating Effexor but still remains dysthymic trust to have some time to herself but overall husband and relationship adds to her stress and depression Anxiety: fluctuates with stress . Vistaril helps  Modifying factors : friends   Principal Problem: GAD Diagnosis:   Patient Active Problem List   Diagnosis Date Noted  . Menopause [Z78.0] 12/03/2014  . Marital conflict [Z63.0] 06/21/2014  . GAD (generalized anxiety disorder) [F41.1]   . GERD (gastroesophageal reflux disease) [K21.9]   . Peri-menopause [N95.1]   . Headache [R51]   . Generalized anxiety disorder [F41.1] 03/05/2013   Total Time spent with patient: 30 minutes   Past Medical History:  Past Medical History:  Diagnosis Date  . Anxiety   . GAD (generalized anxiety disorder)   . GERD (gastroesophageal reflux disease)   . Headache   . Hx of cardiovascular stress test    ETT-Myoview (03/2014):  EF 58%, no ischemia; normal  . Peri-menopause     Past Surgical History:  Procedure Laterality Date  . CESAREAN SECTION    . DILITATION & CURRETTAGE/HYSTROSCOPY WITH ESSURE     Family History:  Family History  Problem Relation Age of Onset  . Coronary artery disease Maternal Grandfather   . Cancer Maternal Grandfather   .  Coronary artery disease Maternal Grandmother   . Cancer Maternal Grandmother   . Coronary artery disease Paternal Grandfather   . Cancer Paternal Grandfather   . Coronary artery disease Paternal Grandmother   . Cancer Paternal Grandmother   . Diabetes Cousin   . Alcohol abuse Neg Hx   . Anxiety disorder Neg Hx   . Bipolar disorder Neg Hx   . Depression Neg Hx   . Drug abuse Neg Hx   . OCD Neg Hx   . Schizophrenia Neg Hx   . ADD / ADHD Neg Hx    Social History:  History  Alcohol Use No     History  Drug Use No    Social History   Social History  . Marital status: Married    Spouse name: N/A  . Number of children: N/A  . Years of education: N/A   Social History Main Topics  . Smoking status: Current Every Day Smoker    Packs/day: 0.50    Years: 39.00    Types: Cigarettes  . Smokeless tobacco: Never Used  . Alcohol use No  . Drug use: No  . Sexual activity: No   Other Topics Concern  . None   Social History Narrative  . None     Psychiatric Specialty Exam: Physical Exam  Review of Systems  Cardiovascular: Negative for chest pain.  Gastrointestinal: Negative for nausea.  Skin: Negative for rash.  Psychiatric/Behavioral: Positive for depression.    Blood pressure  124/72, pulse 96, resp. rate 16, height  (1.575 m), weight 139 lb (63 kg), last menstrual period 11/11/2012, SpO2 96 %.Body mass index is 25.42 kg/m.  General Appearance: Well Groomed  Patent attorney::  Good  Speech:  Clear and Coherent  Volume:  Normal  Mood: dysthymic  Affect:  congruent  Thought Process:  Coherent and Goal Directed  Orientation:  Full (Time, Place, and Person)  Thought Content:  WDL  Suicidal Thoughts:  No  Homicidal Thoughts:  No  Memory:  NA  Judgement:  Intact  Insight:  Good and Present  Psychomotor Activity:  Normal  Concentration:  Good  Recall:  Good  Fund of Knowledge:Good  Language: Good  Akathisia:  No  Handed:  Right  AIMS (if indicated):     Assets:   Communication Skills Desire for Improvement Financial Resources/Insurance Housing Leisure Time Physical Health Resilience Social Support Talents/Skills Transportation  ADL's:  Intact  Cognition: WNL  Sleep:        Current Medications: Current Outpatient Prescriptions  Medication Sig Dispense Refill  . buPROPion (WELLBUTRIN SR) 100 MG 12 hr tablet Take 1 tablet (100 mg total) by mouth daily. 30 tablet 1  . cyclobenzaprine (FLEXERIL) 10 MG tablet Reported on 10/07/2015  0  . dexlansoprazole (DEXILANT) 60 MG capsule Take 60 mg by mouth daily.    Marland Kitchen estradiol (VIVELLE-DOT) 0.1 MG/24HR patch Place 1 patch onto the skin.    . fexofenadine (ALLEGRA) 180 MG tablet Take 180 mg by mouth daily.    . fluticasone (FLONASE) 50 MCG/ACT nasal spray Place 2 sprays into the nose.    . hydrOXYzine (ATARAX/VISTARIL) 10 MG tablet Take 1 tablet (10 mg total) by mouth 2 (two) times daily as needed for anxiety. 45 tablet 2  . MINIVELLE 0.05 MG/24HR Place 1 patch onto the skin daily. Reported on 10/07/2015    . progesterone (PROMETRIUM) 100 MG capsule   1  . venlafaxine XR (EFFEXOR-XR) 150 MG 24 hr capsule Take 2 capsules (300 mg total) by mouth daily with breakfast. 60 capsule 2  . zonisamide (ZONEGRAN) 25 MG capsule Reported on 10/07/2015  1   No current facility-administered medications for this visit.     Lab Results: No results found for this or any previous visit (from the past 48 hour(s)).  Physical Findings: AIMS:  CIWA:   COWS:    Treatment Plan Summary: Medication management  MDD; somewhat worse. Will print wellbutrin this time to add so there is no confusion. Continue effexor.  GAD: fluctuates. Continue effexor and vistaril Relationship : conflict upset her. She tries to walk away to avoid exacerbation.   Prescription sent in. Review side effects and concerns were addressed follow-up in2 months or earlier if needed   3:05 PM 01/06/2017

## 2017-02-22 ENCOUNTER — Ambulatory Visit (HOSPITAL_COMMUNITY): Payer: Self-pay | Admitting: Psychiatry

## 2017-03-08 ENCOUNTER — Telehealth: Payer: Self-pay

## 2017-03-08 NOTE — Telephone Encounter (Signed)
FAXED NOTES TO [facility] 

## 2017-03-11 ENCOUNTER — Ambulatory Visit (INDEPENDENT_AMBULATORY_CARE_PROVIDER_SITE_OTHER): Payer: 59 | Admitting: Cardiovascular Disease

## 2017-03-11 ENCOUNTER — Encounter: Payer: Self-pay | Admitting: Cardiovascular Disease

## 2017-03-11 VITALS — BP 122/64 | HR 68 | Ht 63.0 in | Wt 136.0 lb

## 2017-03-11 DIAGNOSIS — R55 Syncope and collapse: Secondary | ICD-10-CM | POA: Insufficient documentation

## 2017-03-11 NOTE — Assessment & Plan Note (Signed)
[name] was referred by a nurse practitioner at a local urgent care associated with [facility] for 2 recent episodes of syncope that occurred 2 weeks ago while at a funeral. She has no cardiac risk factors other than a 20 year history of tobacco abuse currently smoking one half pack per day. She's never had a heart attack or stroke. She denies chest pain or shortness of breath. She had 2 recent syncopal episodes without associated seizure activity while attending a funeral.This did not sound vagal.

## 2017-03-11 NOTE — Addendum Note (Signed)
Addended by: Yeimy Brabant L on: 03/11/2017 04:18 PM   Modules accepted: Orders  

## 2017-03-11 NOTE — Progress Notes (Signed)
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 She was referred by her PCP for cardiovascular evaluation because of 2 recent episodes of syncope while attending a funeral 2 weeks ago. Her risk factors include 20-pack-years of tobacco abuse currently smoking one half pack per day. Otherwise, she has no cardiac risk factors. She's never had a heart attack or stroke and denies chest pain or shortness of breath. She was at a funeral 2 weeks ago and had 2 episodes of syncope in fairly rapid succession. He was high although she said she was hydrated. There is no nausea or other vagal associated symptoms.   Current Outpatient Prescriptions  Medication Sig Dispense Refill  . buPROPion (WELLBUTRIN SR) 100 MG 12 hr tablet Take 1 tablet (100 mg total) by mouth daily. 30 tablet 1  . cyclobenzaprine (FLEXERIL) 10 MG tablet Reported on 10/07/2015  0  . fexofenadine (ALLEGRA) 180 MG tablet Take 180 mg by mouth daily.    . fluticasone (FLONASE) 50 MCG/ACT nasal spray Place 2 sprays into the nose.    . hydrOXYzine (ATARAX/VISTARIL) 10 MG tablet Take 1 tablet (10 mg total) by mouth 2 (two) times daily as needed for anxiety. 45 tablet 2  . MINIVELLE 0.05 MG/24HR Place 1 patch onto the skin daily. Reported on 10/07/2015    . progesterone (PROMETRIUM) 100 MG capsule   1  . venlafaxine XR (EFFEXOR-XR) 150 MG 24 hr capsule Take 2 capsules (300 mg total) by mouth daily with breakfast. 60 capsule 2   No current facility-administered medications for this visit.     Allergies  Allergen Reactions  . Banana Itching  . Latex     Social History   Social History  . Marital status: Married    Spouse name: N/A  . Number of children: N/A  . Years of  education: N/A   Occupational History  . Not on file.   Social History Main Topics  . Smoking status: Current Every Day Smoker    Packs/day: 0.50    Years: 39.00    Types: Cigarettes  . Smokeless tobacco: Never Used  . Alcohol use No  . Drug use: No  . Sexual activity: No   Other Topics Concern  . Not on file   Social History Narrative  . No narrative on file     Review of Systems: General: negative for chills, fever, night sweats or weight changes.  Cardiovascular: negative for chest pain, dyspnea on exertion, edema, orthopnea, palpitations, paroxysmal nocturnal dyspnea or shortness of breath Dermatological: negative for rash Respiratory: negative for cough or wheezing Urologic: negative for hematuria Abdominal: negative for nausea, vomiting, diarrhea, bright red blood per rectum, melena, or hematemesis Neurologic: negative for visual changes, syncope, or dizziness All other systems reviewed and are otherwise negative except as noted above.    Blood pressure 122/64, pulse 68, height 5\' 3"  (1.6 m), weight 136 lb (61.7 kg), last menstrual period 11/11/2012.  General appearance: alert and no distress Neck: no adenopathy, no carotid bruit, no JVD, supple, symmetrical, trachea midline and thyroid not enlarged, symmetric, no tenderness/mass/nodules Lungs: clear to auscultation bilaterally Heart: regular rate and rhythm, S1, S2 normal, no murmur, click, rub or gallop Extremities: extremities normal, atraumatic, no cyanosis  or edema  EKG Sinus rhythm at 68 without ST or T-wave changes. There were septal Q waves .I personally reviewed this EKG.  ASSESSMENT AND PLAN:   Syncope Mallory [name] was referred by a nurse practitioner at a local urgent care associated with [facility] for 2 recent episodes of syncope that occurred 2 weeks ago while at a funeral. She has no cardiac risk factors other than a 20 year history of tobacco abuse currently smoking one half pack per day. She's  never had a heart attack or stroke. She denies chest pain or shortness of breath. She had 2 recent syncopal episodes without associated seizure activity while attending a funeral.This did not sound vagal.      Runell Gess MD [facility], [facility] 03/11/2017 3:43 PM

## 2017-03-11 NOTE — Patient Instructions (Signed)
Medication Instructions: Your physician recommends that you continue on your current medications as directed. Please refer to the Current Medication list given to you today.  Testing/Procedures: Your physician has requested that you have an echocardiogram. Echocardiography is a painless test that uses sound waves to create images of your heart. It provides your doctor with information about the size and shape of your heart and how well your heart's chambers and valves are working. This procedure takes approximately one hour. There are no restrictions for this procedure.  Your physician has recommended that you wear a 30 day event monitor. Event monitors are medical devices that record the heart's electrical activity. Doctors most often us these monitors to diagnose arrhythmias. Arrhythmias are problems with the speed or rhythm of the heartbeat. The monitor is a small, portable device. You can wear one while you do your normal daily activities. This is usually used to diagnose what is causing palpitations/syncope (passing out).  Follow-Up: Your physician recommends that you schedule a follow-up appointment after testing with Dr. Allyson SabalBerry.

## 2017-03-23 ENCOUNTER — Ambulatory Visit (INDEPENDENT_AMBULATORY_CARE_PROVIDER_SITE_OTHER): Payer: 59

## 2017-03-23 ENCOUNTER — Other Ambulatory Visit: Payer: Self-pay

## 2017-03-23 ENCOUNTER — Ambulatory Visit (HOSPITAL_COMMUNITY): Payer: 59 | Attending: Cardiovascular Disease

## 2017-03-23 DIAGNOSIS — F17210 Nicotine dependence, cigarettes, uncomplicated: Secondary | ICD-10-CM | POA: Diagnosis not present

## 2017-03-23 DIAGNOSIS — R55 Syncope and collapse: Secondary | ICD-10-CM

## 2017-03-23 DIAGNOSIS — Z8249 Family history of ischemic heart disease and other diseases of the circulatory system: Secondary | ICD-10-CM | POA: Diagnosis not present

## 2017-03-23 DIAGNOSIS — E785 Hyperlipidemia, unspecified: Secondary | ICD-10-CM | POA: Diagnosis not present

## 2017-04-08 ENCOUNTER — Ambulatory Visit: Payer: Self-pay | Admitting: Cardiology

## 2017-04-19 ENCOUNTER — Ambulatory Visit (INDEPENDENT_AMBULATORY_CARE_PROVIDER_SITE_OTHER): Payer: 59 | Admitting: Cardiovascular Disease

## 2017-04-19 ENCOUNTER — Encounter: Payer: Self-pay | Admitting: Cardiovascular Disease

## 2017-04-19 VITALS — BP 144/91 | HR 70 | Ht 63.0 in | Wt 129.8 lb

## 2017-04-19 DIAGNOSIS — R55 Syncope and collapse: Secondary | ICD-10-CM

## 2017-04-19 NOTE — Progress Notes (Signed)
[name] returns today for follow-up of her noninvasive test. Her 2-D echo was essentially normal and her vent monitor which she is still wearing shows sinus rhythm/sinus tachycardia without any other take tachycardia or bradycardia arrhythmias. She's had no recurrent episodes of syncope. I will see her back in 3 months

## 2017-04-19 NOTE — Assessment & Plan Note (Signed)
Mallory [name] returns today for follow-up of her noninvasive test. Her 2-D echo was essentially normal and her vent monitor which she is still wearing shows sinus rhythm/sinus tachycardia without any other take tachycardia or bradycardia arrhythmias. She's had no recurrent episodes of syncope. I will see her back in 3 months 

## 2017-04-19 NOTE — Patient Instructions (Signed)
Medication Instructions: Your physician recommends that you continue on your current medications as directed. Please refer to the Current Medication list given to you today.   Follow-Up: Your physician recommends that you schedule a follow-up appointment in: 3 months with Dr. Berry.  If you need a refill on your cardiac medications before your next appointment, please call your pharmacy.  

## 2017-05-13 ENCOUNTER — Ambulatory Visit (HOSPITAL_COMMUNITY): Payer: Self-pay | Admitting: Licensed Clinical Social Worker

## 2017-05-20 ENCOUNTER — Ambulatory Visit (INDEPENDENT_AMBULATORY_CARE_PROVIDER_SITE_OTHER): Payer: 59 | Admitting: Licensed Clinical Social Worker

## 2017-05-20 DIAGNOSIS — F411 Generalized anxiety disorder: Secondary | ICD-10-CM | POA: Diagnosis not present

## 2017-05-20 DIAGNOSIS — F341 Dysthymic disorder: Secondary | ICD-10-CM | POA: Diagnosis not present

## 2017-05-20 DIAGNOSIS — Z630 Problems in relationship with spouse or partner: Secondary | ICD-10-CM | POA: Diagnosis not present

## 2017-05-23 NOTE — Progress Notes (Signed)
   THERAPIST PROGRESS NOTE  Session Time: 10:10-11:01am  Participation Level: Active  Behavioral Response: CasualAlert  Anxious, Depressed,Tearful   Type of Therapy: Individual Therapy  Treatment Goals addressed: Will need to develop at next session  Interventions: Assessment   Suicidal/Homicidal: Denied both  Therapist Interventions: Inquired about reasons for returning for therapy.  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  Gathered information about patient's mood and functioning as she has been experiencing several significant life stressors.  Assessed patient's support system.  Discussed how therapy can be one place where she is guaranteed privacy and a nonjudgmental environment.  Summary: Reported that since she last saw this therapist approximately two years ago she has "shut down even more." Described how living with her husband has become intolerable.  After going through the diagnostic criteria for Narcissistic Personality Disorder she concluded that her husband has many of the features.  She said "It's all about him.  My beliefs and values don't matter." Expressed intentions of leaving the marriage sometime next year.   Patient has also had concerns about her health.  After a couple episodes of passing out in June she met with a heart specialist, GI doctor, and neurologist.  Found out her heart is fine.  Had an ultrasound with neurologist.  Got a message today that an abnormality was found.  Has yet to call back but plans to later today.   One last major source of stress is the fact that she has been charged with embezzlement.  She fell for a scam and sent someone some money, only the money wasn't hers.  She wrote some checks using her mom's account.  As a result of these circumstances her relationships with her mother and brother are strained.       Plan: Scheduled to return 9/24.  Will develop new treatment plan at that  time.  Diagnosis:  Persistent Depressive Disorder                     Generalized Anxiety Disorder     Armandina Stammer 05/20/2017

## 2017-05-24 ENCOUNTER — Ambulatory Visit (INDEPENDENT_AMBULATORY_CARE_PROVIDER_SITE_OTHER): Payer: 59 | Admitting: Psychiatry

## 2017-05-24 ENCOUNTER — Encounter (HOSPITAL_COMMUNITY): Payer: Self-pay | Admitting: Psychiatry

## 2017-05-24 VITALS — BP 136/84 | HR 90 | Resp 16 | Ht 63.0 in | Wt 131.0 lb

## 2017-05-24 DIAGNOSIS — F17210 Nicotine dependence, cigarettes, uncomplicated: Secondary | ICD-10-CM

## 2017-05-24 DIAGNOSIS — Z653 Problems related to other legal circumstances: Secondary | ICD-10-CM | POA: Diagnosis not present

## 2017-05-24 DIAGNOSIS — F411 Generalized anxiety disorder: Secondary | ICD-10-CM | POA: Diagnosis not present

## 2017-05-24 DIAGNOSIS — F341 Dysthymic disorder: Secondary | ICD-10-CM | POA: Diagnosis not present

## 2017-05-24 DIAGNOSIS — Z630 Problems in relationship with spouse or partner: Secondary | ICD-10-CM | POA: Diagnosis not present

## 2017-05-24 MED ORDER — VENLAFAXINE HCL ER 150 MG PO CP24: 300.0000 mg | ORAL_CAPSULE | Freq: Every day | ORAL | 1 refills | Status: DC

## 2017-05-24 MED ORDER — BUPROPION HCL ER (SR) 100 MG PO TB12: 100.0000 mg | ORAL_TABLET | Freq: Every day | ORAL | 1 refills | Status: DC

## 2017-05-24 NOTE — Progress Notes (Signed)
Patient ID: Mallory DewCarol D Budhu, female   DOB: 1956-03-31, 61 y.o.   MRN: [id number] [facility]  05/24/2017 4:50 PM Mallory [name]  MRN:  [id number] Subjective:  Mallory [name] is in to follow up on her GAD and marital conflict.  Diagnosed with depression and anxiety   She presented initially  with a long history of marital conflicts. Says her husband is bipolar and becomes very difficult dealing with him.  Patient husband has gone thru chemotherapy and is very argumentive and controlling that upset her  Recent stress being that she has been charged with embezzlement. She fell for a scam and sent someone some money, only the money wasn't hers. She wrote some checks using her mom's account.  As a result of these circumstances her relationships with her mother and brother are strained.  She hasnt told husband it was a scam. He keeps on nagging and putting her down Apparently she hasnt been taking wellbutrin but says it did help in the past. For some reason she didn't get refill and wants to get back  Support system is low due to current stress and she has a Clinical research associatelawyer   Anxiety:increaesed Depression: worse  Modifying factors : friends   Principal Problem: GAD Diagnosis:   Patient Active Problem List   Diagnosis Date Noted  . Syncope [R55] 03/11/2017  . Menopause [Z78.0] 12/03/2014  . Marital conflict [Z63.0] 06/21/2014  . GAD (generalized anxiety disorder) [F41.1]   . GERD (gastroesophageal reflux disease) [K21.9]   . Peri-menopause [N95.1]   . Headache [R51]   . Generalized anxiety disorder [F41.1] 03/05/2013   Total Time spent with patient: 30 minutes   Past Medical History:  Past Medical History:  Diagnosis Date  . Anxiety   . GAD (generalized anxiety disorder)   . GERD (gastroesophageal reflux disease)   . Headache   . Hx of cardiovascular stress test    ETT-Myoview (03/2014):  EF 58%, no ischemia; normal  . Peri-menopause     Past Surgical History:  Procedure Laterality  Date  . CESAREAN SECTION    . DILITATION & CURRETTAGE/HYSTROSCOPY WITH ESSURE     Family History:  Family History  Problem Relation Age of Onset  . Coronary artery disease Maternal Grandfather   . Cancer Maternal Grandfather   . Coronary artery disease Maternal Grandmother   . Cancer Maternal Grandmother   . Coronary artery disease Paternal Grandfather   . Cancer Paternal Grandfather   . Coronary artery disease Paternal Grandmother   . Cancer Paternal Grandmother   . Diabetes Cousin   . Alcohol abuse Neg Hx   . Anxiety disorder Neg Hx   . Bipolar disorder Neg Hx   . Depression Neg Hx   . Drug abuse Neg Hx   . OCD Neg Hx   . Schizophrenia Neg Hx   . ADD / ADHD Neg Hx    Social History:  History  Alcohol Use No     History  Drug Use No    Social History   Social History  . Marital status: Married    Spouse name: N/A  . Number of children: N/A  . Years of education: N/A   Social History Main Topics  . Smoking status: Current Every Day Smoker    Packs/day: 0.50    Years: 39.00    Types: Cigarettes  . Smokeless tobacco: Never Used  . Alcohol use No  . Drug use: No  . Sexual activity: No   Other Topics Concern  .  None   Social History Narrative  . None     Psychiatric Specialty Exam: Physical Exam  Review of Systems  Cardiovascular: Negative for chest pain.  Gastrointestinal: Negative for nausea.  Skin: Negative for rash.  Psychiatric/Behavioral: Positive for depression.    Blood pressure 136/84, pulse 90, resp. rate 16, height  (1.6 m), weight 131 lb (59.4 kg), last menstrual period 11/11/2012, SpO2 97 %.Body mass index is 23.21 kg/m.  General Appearance: Well Groomed  Patent attorney::  Good  Speech:  Clear and Coherent  Volume:  Normal  Mood: dysthymic  Affect:  congruent  Thought Process:  Coherent and Goal Directed  Orientation:  Full (Time, Place, and Person)  Thought Content:  WDL  Suicidal Thoughts:  No  Homicidal Thoughts:  No   Memory:  NA  Judgement:  Intact  Insight:  Good and Present  Psychomotor Activity:  Normal  Concentration:  Good  Recall:  Good  Fund of Knowledge:Good  Language: Good  Akathisia:  No  Handed:  Right  AIMS (if indicated):     Assets:  Communication Skills Desire for Improvement Financial Resources/Insurance Housing Leisure Time Physical Health Resilience Social Support Talents/Skills Transportation  ADL's:  Intact  Cognition: WNL  Sleep:        Current Medications: Current Outpatient Prescriptions  Medication Sig Dispense Refill  . buPROPion (WELLBUTRIN SR) 100 MG 12 hr tablet Take 1 tablet (100 mg total) by mouth daily. 30 tablet 1  . cyclobenzaprine (FLEXERIL) 10 MG tablet Reported on 10/07/2015  0  . fexofenadine (ALLEGRA) 180 MG tablet Take 180 mg by mouth daily.    . fluticasone (FLONASE) 50 MCG/ACT nasal spray Place 2 sprays into the nose.    . hydrOXYzine (ATARAX/VISTARIL) 10 MG tablet Take 1 tablet (10 mg total) by mouth 2 (two) times daily as needed for anxiety. 45 tablet 2  . MINIVELLE 0.05 MG/24HR Place 1 patch onto the skin daily. Reported on 10/07/2015    . progesterone (PROMETRIUM) 100 MG capsule   1  . venlafaxine XR (EFFEXOR-XR) 150 MG 24 hr capsule Take 2 capsules (300 mg total) by mouth daily with breakfast. 60 capsule 1   No current facility-administered medications for this visit.     Lab Results: No results found for this or any previous visit (from the past 48 hour(s)).  Physical Findings: AIMS:  CIWA:   COWS:    Treatment Plan Summary: Medication management  MDD; worse. Will print wellbutrin. It has helped her but she needs therapy to deal with current stress as well  Continue effexor.  GAD: worse. Continue effexor, schedule therapy regular  Relationship :conflicts excacerbates her depression. She is having difficulty to have "ME time" as he is always nagging. Encouraged to have some me time to distract from worries.  Prescription  printed. Provided supportive therapy.  Denies prior manic symptoms or psychosis. Said she planned this thing with that person with expecting money and not was impulsive.  FU 4 weeks or earlier if needed   4:50 PM 05/24/2017

## 2017-06-06 ENCOUNTER — Ambulatory Visit (INDEPENDENT_AMBULATORY_CARE_PROVIDER_SITE_OTHER): Payer: 59 | Admitting: Licensed Clinical Social Worker

## 2017-06-06 DIAGNOSIS — F411 Generalized anxiety disorder: Secondary | ICD-10-CM

## 2017-06-06 DIAGNOSIS — Z630 Problems in relationship with spouse or partner: Secondary | ICD-10-CM

## 2017-06-06 DIAGNOSIS — F341 Dysthymic disorder: Secondary | ICD-10-CM | POA: Diagnosis not present

## 2017-06-06 NOTE — Progress Notes (Signed)
   THERAPIST PROGRESS NOTE  Session Time: 11:10-12:00pm  Participation Level: Active  Behavioral Response: CasualAlert  Anxious  Type of Therapy: Individual Therapy  Treatment Goals addressed: Reduce anxiety/worry, increase expression of thoughts and feelings in safe environment of therapy  Interventions: Treatment planning   Suicidal/Homicidal: Denied both  Therapist Interventions:  Collaborated with patient to develop her treatment plan.  Identified strengths, preferences, and both long term and short term goals.  Summary:   Talked about how one of the main things she would like to get out of therapy is simply to have one place where she can feel comfortable expressing her thoughts and feelings without having to worry about being criticized.  Noted that she also wants support and guidance as she pursues making some big changes in her life.  She plans to leave her husband but does not know yet how she is going to make that a reality.  She noted that she has lost much of her supports.  She has been avoiding friends and going to church because she feels embarrassed about her legal problems.  She also doesn't want to be grilled by her husband which is what typically happens when she does spend time with others.       Plan: Will return in 2-3 weeks.    Diagnosis:  Persistent Depressive Disorder                     Generalized Anxiety Disorder     Darrin Luis 06/06/2017

## 2017-06-29 ENCOUNTER — Ambulatory Visit (INDEPENDENT_AMBULATORY_CARE_PROVIDER_SITE_OTHER): Payer: 59 | Admitting: Licensed Clinical Social Worker

## 2017-06-29 DIAGNOSIS — F411 Generalized anxiety disorder: Secondary | ICD-10-CM | POA: Diagnosis not present

## 2017-06-29 DIAGNOSIS — F341 Dysthymic disorder: Secondary | ICD-10-CM | POA: Diagnosis not present

## 2017-06-29 DIAGNOSIS — Z630 Problems in relationship with spouse or partner: Secondary | ICD-10-CM

## 2017-06-30 NOTE — Progress Notes (Signed)
   THERAPIST PROGRESS NOTE  Session Time: 1:10pm-2:10pm  Participation Level: Active  Behavioral Response: CasualAlert  Anxious  Type of Therapy: Individual Therapy  Treatment Goals addressed: Reduce anxiety/worry, increase expression of thoughts and feelings in safe environment of therapy  Interventions: Mindfulness, Strengths based therapy   Suicidal/Homicidal: Denied both  Therapist Interventions:  Patient asked therapist for an explanation of why her husband lies about experiences he has had.  Discussed the idea that she can develop all kinds of theories about why her husband is the way he is but there is no way to know the absolute truth.  Expressed concern about her getting too caught up in ruminations.  Encouraged her to work on letting go of these unhelpful thinking patterns and focus on the things that are within her control.   Explored how over the course of her marriage she has lost herself because she developed a habit of going along with whatever she believed he wanted.  Validated excitement and fear regarding her intentions to leave her marriage when she has the resources to do so.      Summary:   Indicated she has spent a lot of time trying to understand why her husband says and does different things.  She keeps coming back to the same questions again and again.  Was able to acknowledge all this thinking hasn't resulted in any resolution.   Talked about how in her early years of parenting their daughter she had aspirations of having an open and honest relationship with her.  She would set limitations but her husband would often undermine her decisions so she came out looking like the "bad guy" .  Eventually she decided speaking up was not worth the criticism she was bound to hear.     Plan: Will return in 2-3 weeks.    Diagnosis:  Persistent Depressive Disorder                     Generalized Anxiety Disorder     Darrin LuisSolomon, Sarah A, LCSW 06/29/2017

## 2017-07-20 ENCOUNTER — Ambulatory Visit (HOSPITAL_COMMUNITY): Payer: Self-pay | Admitting: Psychiatry

## 2017-07-20 ENCOUNTER — Ambulatory Visit: Payer: 59 | Admitting: Cardiovascular Disease

## 2017-07-20 ENCOUNTER — Encounter: Payer: Self-pay | Admitting: Cardiovascular Disease

## 2017-07-20 DIAGNOSIS — R55 Syncope and collapse: Secondary | ICD-10-CM

## 2017-07-20 NOTE — Patient Instructions (Signed)
Your physician recommends that you schedule a follow-up appointment in: AS NEEDED  

## 2017-07-20 NOTE — Assessment & Plan Note (Signed)
[name] returns today for follow-up of syncope which occurred this past summer. She had a 2-D echo which was normal and an event monitor that showed sinus rhythm, sinus tachycardia and sinus bradycardia but no other arrhythmias that would've contributed to this. She's had no further episodes of syncope. I will see her back on an as-needed basis.

## 2017-07-20 NOTE — Progress Notes (Signed)
07/20/2017 Mallory [name]   [birth year]  [id number]  Primary Physician Dow AdolphStevens, Lisa Lajuana, PA Primary Cardiologist: Runell GessJonathan J Chianna Spirito MD Nicholes CalamityFACP, FACC, FAHA, [place]  HPI:  Mallory [name] is a 61 y.o. 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 She was referred by her PCP for cardiovascular evaluation because of 2 episodes of syncope while attending a funeral this past summer. I last saw her in the office 04/19/17 Her risk factors include 20-pack-years of tobacco abuse currently smoking one half pack per day. Otherwise, she has no cardiac risk factors. She's never had a heart attack or stroke and denies chest pain or shortness of breath. She was at a funeral and had 2 episodes of syncope in fairly rapid succession. He was hot out although she said she was hydrated. There is no nausea or other vagal associated symptoms. She had a 2-D echo which was essentially normal and an event monitor that was unrevealing. Since I saw her 3 months ago she's had no recurrent episodes. She denies chest pain or shortness of breath.     Current Meds  Medication Sig  . buPROPion (WELLBUTRIN SR) 100 MG 12 hr tablet Take 1 tablet (100 mg total) by mouth daily.  . cyclobenzaprine (FLEXERIL) 10 MG tablet Reported on 10/07/2015  . fexofenadine (ALLEGRA) 180 MG tablet Take 180 mg by mouth daily.  . fluticasone (FLONASE) 50 MCG/ACT nasal spray Place 2 sprays into the nose.  . hydrOXYzine (ATARAX/VISTARIL) 10 MG tablet Take 1 tablet (10 mg total) by mouth 2 (two) times daily as needed for anxiety.  Marland Kitchen. MINIVELLE 0.05 MG/24HR Place 1 patch onto the skin daily. Reported on 10/07/2015  . progesterone (PROMETRIUM) 100 MG capsule   . venlafaxine XR (EFFEXOR-XR) 150 MG 24 hr capsule Take 2 capsules (300 mg total) by mouth daily with breakfast.     Allergies  Allergen Reactions  . Banana Itching  . Latex     Social History   Socioeconomic History    . Marital status: Married    Spouse name: Not on file  . Number of children: Not on file  . Years of education: Not on file  . Highest education level: Not on file  Social Needs  . Financial resource strain: Not on file  . Food insecurity - worry: Not on file  . Food insecurity - inability: Not on file  . Transportation needs - medical: Not on file  . Transportation needs - non-medical: Not on file  Occupational History  . Not on file  Tobacco Use  . Smoking status: Current Every Day Smoker    Packs/day: 0.50    Years: 39.00    Pack years: 19.50    Types: Cigarettes  . Smokeless tobacco: Never Used  Substance and Sexual Activity  . Alcohol use: No    Alcohol/week: 0.0 oz  . Drug use: No  . Sexual activity: No    Partners: Male  Other Topics Concern  . Not on file  Social History Narrative  . Not on file     Review of Systems: General: negative for chills, fever, night sweats or weight changes.  Cardiovascular: negative for chest pain, dyspnea on exertion, edema, orthopnea, palpitations, paroxysmal nocturnal dyspnea or shortness of breath Dermatological: negative for rash Respiratory: negative for cough or wheezing Urologic: negative for hematuria Abdominal: negative for nausea, vomiting, diarrhea, bright red blood per rectum, melena, or hematemesis Neurologic:  negative for visual changes, syncope, or dizziness All other systems reviewed and are otherwise negative except as noted above.    Blood pressure 120/62, pulse 72, height 5\' 3"  (1.6 m), weight 135 lb (61.2 kg), last menstrual period 11/11/2012.  General appearance: alert and no distress Neck: no adenopathy, no carotid bruit, no JVD, supple, symmetrical, trachea midline and thyroid not enlarged, symmetric, no tenderness/mass/nodules Lungs: clear to auscultation bilaterally Heart: regular rate and rhythm, S1, S2 normal, no murmur, click, rub or gallop Extremities: extremities normal, atraumatic, no cyanosis or  edema Pulses: 2+ and symmetric Skin: Skin color, texture, turgor normal. No rashes or lesions Neurologic: Alert and oriented X 3, normal strength and tone. Normal symmetric reflexes. Normal coordination and gait  EKG not performed today  ASSESSMENT AND PLAN:   Syncope Ms. Fayrene FearingJames returns today for follow-up of syncope which occurred this past summer. She had a 2-D echo which was normal and an event monitor that showed sinus rhythm, sinus tachycardia and sinus bradycardia but no other arrhythmias that would've contributed to this. She's had no further episodes of syncope. I will see her back on an as-needed basis.      Runell GessJonathan J. Lynix Bonine MD FACP,FACC,FAHA, [facility] 07/20/2017 11:34 AM

## 2017-07-21 ENCOUNTER — Ambulatory Visit (INDEPENDENT_AMBULATORY_CARE_PROVIDER_SITE_OTHER): Payer: 59 | Admitting: Psychiatry

## 2017-07-21 ENCOUNTER — Encounter (HOSPITAL_COMMUNITY): Payer: Self-pay | Admitting: Psychiatry

## 2017-07-21 ENCOUNTER — Other Ambulatory Visit: Payer: Self-pay

## 2017-07-21 VITALS — BP 124/72 | HR 95 | Resp 18 | Ht 63.0 in | Wt 134.0 lb

## 2017-07-21 DIAGNOSIS — F411 Generalized anxiety disorder: Secondary | ICD-10-CM

## 2017-07-21 DIAGNOSIS — Z630 Problems in relationship with spouse or partner: Secondary | ICD-10-CM | POA: Diagnosis not present

## 2017-07-21 DIAGNOSIS — F341 Dysthymic disorder: Secondary | ICD-10-CM

## 2017-07-21 DIAGNOSIS — F17210 Nicotine dependence, cigarettes, uncomplicated: Secondary | ICD-10-CM

## 2017-07-21 MED ORDER — BUPROPION HCL ER (SR) 100 MG PO TB12: 100.0000 mg | ORAL_TABLET | Freq: Every day | ORAL | 1 refills | Status: DC

## 2017-07-21 MED ORDER — VENLAFAXINE HCL ER 150 MG PO CP24: 300.0000 mg | ORAL_CAPSULE | Freq: Every day | ORAL | 1 refills | Status: DC

## 2017-07-21 NOTE — Progress Notes (Signed)
Patient ID: Mallory DewCarol D Viera, female   DOB: November 13, 1955, 61 y.o.   MRN: [id number] [facility]  07/21/2017 4:10 PM Mallory [name]  MRN:  [id number] Subjective:  Mallory [name] is in to follow up on her GAD and marital conflict.  Diagnosed with depression and anxiety   She presented initially  with a long history of marital conflicts. Says her husband is bipolar and becomes very difficult dealing with him. He has gone thru chemo Some embezzlement case in past with scam has effected her financially She is recovering and anxiety better since now taking meds regularly and doing therapy   Support system is low due to current stress and she has a Clinical research associatelawyer   Anxiety: improved Depression: not worse  Modifying factors : venting with friends help    Principal Problem: GAD Diagnosis:   Patient Active Problem List   Diagnosis Date Noted  . Syncope [R55] 03/11/2017  . Menopause [Z78.0] 12/03/2014  . Marital conflict [Z63.0] 06/21/2014  . GAD (generalized anxiety disorder) [F41.1]   . GERD (gastroesophageal reflux disease) [K21.9]   . Peri-menopause [N95.1]   . Headache [R51]   . Generalized anxiety disorder [F41.1] 03/05/2013   Total Time spent with patient: 30 minutes   Past Medical History:  Past Medical History:  Diagnosis Date  . Anxiety   . GAD (generalized anxiety disorder)   . GERD (gastroesophageal reflux disease)   . Headache   . Hx of cardiovascular stress test    ETT-Myoview (03/2014):  EF 58%, no ischemia; normal  . Peri-menopause     Past Surgical History:  Procedure Laterality Date  . CESAREAN SECTION    . DILITATION & CURRETTAGE/HYSTROSCOPY WITH ESSURE     Family History:  Family History  Problem Relation Age of Onset  . Coronary artery disease Maternal Grandfather   . Cancer Maternal Grandfather   . Coronary artery disease Maternal Grandmother   . Cancer Maternal Grandmother   . Coronary artery disease Paternal Grandfather   . Cancer Paternal Grandfather   .  Coronary artery disease Paternal Grandmother   . Cancer Paternal Grandmother   . Diabetes Cousin   . Alcohol abuse Neg Hx   . Anxiety disorder Neg Hx   . Bipolar disorder Neg Hx   . Depression Neg Hx   . Drug abuse Neg Hx   . OCD Neg Hx   . Schizophrenia Neg Hx   . ADD / ADHD Neg Hx    Social History:  Social History   Substance and Sexual Activity  Alcohol Use No  . Alcohol/week: 0.0 oz     Social History   Substance and Sexual Activity  Drug Use No    Social History   Socioeconomic History  . Marital status: Married    Spouse name: None  . Number of children: None  . Years of education: None  . Highest education level: None  Social Needs  . Financial resource strain: None  . Food insecurity - worry: None  . Food insecurity - inability: None  . Transportation needs - medical: None  . Transportation needs - non-medical: None  Occupational History  . None  Tobacco Use  . Smoking status: Current Every Day Smoker    Packs/day: 0.50    Years: 39.00    Pack years: 19.50    Types: Cigarettes  . Smokeless tobacco: Never Used  Substance and Sexual Activity  . Alcohol use: No    Alcohol/week: 0.0 oz  . Drug use: No  .  Sexual activity: No    Partners: Male  Other Topics Concern  . None  Social History Narrative  . None     Psychiatric Specialty Exam: Physical Exam  Review of Systems  Cardiovascular: Negative for palpitations.  Gastrointestinal: Negative for nausea.  Skin: Negative for rash.    Blood pressure 124/72, pulse 95, resp. rate 18, height 5\' 3"  (1.6 m), weight 134 lb (60.8 kg), last menstrual period 11/11/2012, SpO2 96 %.Body mass index is 23.74 kg/m.  General Appearance: Well Groomed  Eye Contact::  Good  Speech:  Clear and Coherent  Volume:  Normal  Mood: better. Less dysphoric  Affect:  Congruent more reactive  Thought Process:  Coherent and Goal Directed  Orientation:  Full (Time, Place, and Person)  Thought Content:  WDL  Suicidal  Thoughts:  No  Homicidal Thoughts:  No  Memory:  NA  Judgement:  Intact  Insight:  Good and Present  Psychomotor Activity:  Normal  Concentration:  Good  Recall:  Good  Fund of Knowledge:Good  Language: Good  Akathisia:  No  Handed:  Right  AIMS (if indicated):     Assets:  Communication Skills Desire for Improvement Financial Resources/Insurance Housing Leisure Time Physical Health Resilience Social Support Talents/Skills Transportation  ADL's:  Intact  Cognition: WNL  Sleep:        Current Medications: Current Outpatient Medications  Medication Sig Dispense Refill  . buPROPion (WELLBUTRIN SR) 100 MG 12 hr tablet Take 1 tablet (100 mg total) daily by mouth. 30 tablet 1  . cyclobenzaprine (FLEXERIL) 10 MG tablet Reported on 10/07/2015  0  . fexofenadine (ALLEGRA) 180 MG tablet Take 180 mg by mouth daily.    . fluticasone (FLONASE) 50 MCG/ACT nasal spray Place 2 sprays into the nose.    . hydrOXYzine (ATARAX/VISTARIL) 10 MG tablet Take 1 tablet (10 mg total) by mouth 2 (two) times daily as needed for anxiety. 45 tablet 2  . MINIVELLE 0.05 MG/24HR Place 1 patch onto the skin daily. Reported on 10/07/2015    . progesterone (PROMETRIUM) 100 MG capsule   1  . venlafaxine XR (EFFEXOR-XR) 150 MG 24 hr capsule Take 2 capsules (300 mg total) daily with breakfast by mouth. 60 capsule 1   No current facility-administered medications for this visit.     Lab Results: No results found for this or any previous visit (from the past 48 hour(s)).  Physical Findings: AIMS:  CIWA:   COWS:    Treatment Plan Summary: Medication management  MDD; improved since back on effexo and wellbutrin. No side effects  GAD: better. Continue effexor  Relationship :conflicts effect mood. Working in therapy and avoiding conflicts  provided supportive therapy Questions addressed. FU 2 months. Continue therapy  4:10 PM 07/21/2017

## 2017-07-22 ENCOUNTER — Ambulatory Visit (INDEPENDENT_AMBULATORY_CARE_PROVIDER_SITE_OTHER): Payer: 59 | Admitting: Licensed Clinical Social Worker

## 2017-07-22 DIAGNOSIS — F411 Generalized anxiety disorder: Secondary | ICD-10-CM | POA: Diagnosis not present

## 2017-07-22 DIAGNOSIS — F341 Dysthymic disorder: Secondary | ICD-10-CM

## 2017-07-22 NOTE — Progress Notes (Signed)
   THERAPIST PROGRESS NOTE  Session Time: 10:02am-11:00am  Participation Level: Active  Behavioral Response: CasualAlert  Tearful  Type of Therapy: Individual Therapy  Treatment Goals addressed: Reduce anxiety/worry, increase expression of thoughts and feelings in safe environment of therapy  Interventions: CBT   Suicidal/Homicidal: Denied both  Therapist Interventions:  Discussed how patient has come to realize she numbs herself from emotions as a coping strategy.  Acknowledges this helps her to feel less distress in the moment, but it bothers her that it results in an attitude of not caring.  Challenged her to stop labeling herself as "cold hearted" and instead acknowledge the purpose of her behavior.  Emphasized the distinction between labeling behavior versus labeling the person. Pointed out how patient has been able to identify values and put them into practice when it comes to caring for her mom.       Summary:   Reported her mom's memory has worsened.  She is making an effort to call and visit her mom more often.  Talked about how she chooses to avoid interacting with the rest of her family. When asked about her level of anxiety over the past few weeks she said "I'm doing OK.  I still have moments."   Indicated she is finding some enjoyment in activities like reading.       Plan: Return in 2-3 weeks.    Diagnosis:  Persistent Depressive Disorder                     Generalized Anxiety Disorder     Darrin LuisSolomon, Sarah A, LCSW 07/22/2017

## 2017-08-17 ENCOUNTER — Ambulatory Visit (HOSPITAL_COMMUNITY): Payer: 59 | Admitting: Licensed Clinical Social Worker

## 2017-08-17 DIAGNOSIS — F411 Generalized anxiety disorder: Secondary | ICD-10-CM | POA: Diagnosis not present

## 2017-08-17 DIAGNOSIS — F341 Dysthymic disorder: Secondary | ICD-10-CM | POA: Diagnosis not present

## 2017-08-17 NOTE — Progress Notes (Signed)
   THERAPIST PROGRESS NOTE  Session Time: 11:10am-12:01pm  Participation Level: Active  Behavioral Response: CasualAlert  Anxious  Type of Therapy: Individual Therapy  Treatment Goals addressed: Reduce anxiety/worry, increase expression of thoughts and feelings in safe environment of therapy  Interventions: Mindfulness, Assessment, CBT, Solution focused  Suicidal/Homicidal: Denied both  Therapist Interventions:  Discussed how individuals have a choice about whether or not to hold onto a negative perspective. Talked about how patient knows what she wants and has an idea of how she can get there, but the barrier of lack of finances prevents her from moving forward.  Discussed how she can work on removing that barrier.   Reminded patient of how practicing mindfulness can help you get out of ruminative thinking.  Encouraged her to download a couple of mindfulness apps and do an exercise whenever she catches herself dwelling on the past or worrying about the future.     Summary:   Reported ruminating on a frequent basis especially at bedtime.  Also noted she is having headaches daily.  Takes Excedrin Migraine 1-2 times a day.  Indicated she is open to implementing mindfulness more regularly.   Reported she plans to work on getting a job in the near future so she can start setting aside money in a new bank account.      Expressed intentions of taking walks more regularly.  Reflected on how this has been an effective coping strategy for a long time.  Therapist encouraged her to choose a consistent time each day to walk.       Plan: Return in 2-3 weeks.    Diagnosis:  Persistent Depressive Disorder                     Generalized Anxiety Disorder     Darrin LuisSolomon, Sarah A, LCSW 08/17/2017

## 2017-09-28 ENCOUNTER — Ambulatory Visit (HOSPITAL_COMMUNITY): Payer: 59 | Admitting: Psychiatry

## 2017-10-05 ENCOUNTER — Ambulatory Visit (HOSPITAL_COMMUNITY): Payer: 59 | Admitting: Psychiatry

## 2017-10-12 ENCOUNTER — Ambulatory Visit (HOSPITAL_COMMUNITY): Payer: 59 | Admitting: Licensed Clinical Social Worker

## 2017-10-12 DIAGNOSIS — Z630 Problems in relationship with spouse or partner: Secondary | ICD-10-CM | POA: Diagnosis not present

## 2017-10-12 DIAGNOSIS — F411 Generalized anxiety disorder: Secondary | ICD-10-CM | POA: Diagnosis not present

## 2017-10-12 DIAGNOSIS — F341 Dysthymic disorder: Secondary | ICD-10-CM

## 2017-10-13 NOTE — Progress Notes (Signed)
   THERAPIST PROGRESS NOTE  Session Time: 2:10pm-3:05pm  Participation Level: Active  Behavioral Response: CasualAlert  Tearful  Angry  Type of Therapy: Individual Therapy  Treatment Goals addressed: Reduce anxiety/worry, increase expression of thoughts and feelings in safe environment of therapy  Interventions: CBT, Encouraging Thoughts and Feelings of Self-Worth   Suicidal/Homicidal: Denied both  Therapist Interventions:  Patient explained how she has decided to let her husband have her Social Security income go into his account and his request because she doesn't want to have to listen to him complain about money anymore.  Therapist pointed out the irrationality in her thinking.  Stressed that she has rights to the money she has earned.       Summary:   Continues to spend a lot of time ruminating on how much she dislikes her husband's behavior.  Reported she tries to implement mindfulness as a way of coping, but it hasn't proven to be helpful.  Admitted to having more "bad days" than "good."  She did a lot of this ruminating during today's session, so therapist had to interrupt to redirect her.   Her thought process seems to be distorted.  Not insisting that her Soc Sec go into her own account is counterproductive to her achieving her goals of building up funds so she can leave the relationship.  Says she plans to get a part-time job and have that income go into her own account.  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  Expressed having confidence she can start her new life by the end of this year.            Plan: Return in approximately 3 weeks. Will need to review Treatment Plan at next session.   Diagnosis:  Persistent Depressive Disorder                     Generalized Anxiety Disorder     Darrin LuisSolomon, Sarah A, LCSW 10/12/2017

## 2017-10-19 ENCOUNTER — Encounter (HOSPITAL_COMMUNITY): Payer: Self-pay | Admitting: Psychiatry

## 2017-10-19 ENCOUNTER — Ambulatory Visit (INDEPENDENT_AMBULATORY_CARE_PROVIDER_SITE_OTHER): Payer: 59 | Admitting: Psychiatry

## 2017-10-19 DIAGNOSIS — Z630 Problems in relationship with spouse or partner: Secondary | ICD-10-CM

## 2017-10-19 DIAGNOSIS — F17210 Nicotine dependence, cigarettes, uncomplicated: Secondary | ICD-10-CM | POA: Diagnosis not present

## 2017-10-19 DIAGNOSIS — F341 Dysthymic disorder: Secondary | ICD-10-CM | POA: Diagnosis not present

## 2017-10-19 DIAGNOSIS — F411 Generalized anxiety disorder: Secondary | ICD-10-CM | POA: Diagnosis not present

## 2017-10-19 MED ORDER — VENLAFAXINE HCL ER 150 MG PO CP24: 300.0000 mg | ORAL_CAPSULE | Freq: Every day | ORAL | 1 refills | Status: DC

## 2017-10-19 MED ORDER — BUPROPION HCL ER (SR) 100 MG PO TB12: 100.0000 mg | ORAL_TABLET | Freq: Every day | ORAL | 1 refills | Status: DC

## 2017-10-19 NOTE — Progress Notes (Signed)
Patient ID: Mallory [name], female   DOB: 1956-01-20, 62 y.o.   MR[facility]: [id number] [facility]  10/19/2017 2:10 PM Mallory [name]  MRN:  [id number] Subjective:  Mallory [name] is in to follow up on her GAD and marital conflict.  Diagnosed with depression and anxiety   She presented initially  with a long history of marital conflicts. Says her husband is bipolar and becomes very difficult dealing with him. He has gone thru chemo Some embezzlement case in past with scam has effected her financially  Legal case and marital conflicts keep her down meds help some and thearpy  Worried about relationship as he is controlling  no side effects  Support system is low due to current stress and she has a Clinical research associatelawyer   Anxiety: related to legal case Depression: fluctuates . Not suicidal Modifying factors : venting with friends help    Principal Problem: GAD Diagnosis:   Patient Active Problem List   Diagnosis Date Noted  . Syncope [R55] 03/11/2017  . Menopause [Z78.0] 12/03/2014  . Marital conflict [Z63.0] 06/21/2014  . GAD (generalized anxiety disorder) [F41.1]   . GERD (gastroesophageal reflux disease) [K21.9]   . Peri-menopause [N95.1]   . Headache [R51]   . Generalized anxiety disorder [F41.1] 03/05/2013   Total Time spent with patient: 30 minutes   Past Medical History:  Past Medical History:  Diagnosis Date  . Anxiety   . GAD (generalized anxiety disorder)   . GERD (gastroesophageal reflux disease)   . Headache   . Hx of cardiovascular stress test    ETT-Myoview (03/2014):  EF 58%, no ischemia; normal  . Peri-menopause     Past Surgical History:  Procedure Laterality Date  . CESAREAN SECTION    . DILITATION & CURRETTAGE/HYSTROSCOPY WITH ESSURE     Family History:  Family History  Problem Relation Age of Onset  . Coronary artery disease Maternal Grandfather   . Cancer Maternal Grandfather   . Coronary artery disease Maternal Grandmother   . Cancer Maternal Grandmother   .  Coronary artery disease Paternal Grandfather   . Cancer Paternal Grandfather   . Coronary artery disease Paternal Grandmother   . Cancer Paternal Grandmother   . Diabetes Cousin   . Alcohol abuse Neg Hx   . Anxiety disorder Neg Hx   . Bipolar disorder Neg Hx   . Depression Neg Hx   . Drug abuse Neg Hx   . OCD Neg Hx   . Schizophrenia Neg Hx   . ADD / ADHD Neg Hx    Social History:  Social History   Substance and Sexual Activity  Alcohol Use No  . Alcohol/week: 0.0 oz     Social History   Substance and Sexual Activity  Drug Use No    Social History   Socioeconomic History  . Marital status: Married    Spouse name: None  . Number of children: None  . Years of education: None  . Highest education level: None  Social Needs  . Financial resource strain: None  . Food insecurity - worry: None  . Food insecurity - inability: None  . Transportation needs - medical: None  . Transportation needs - non-medical: None  Occupational History  . None  Tobacco Use  . Smoking status: Current Every Day Smoker    Packs/day: 0.50    Years: 39.00    Pack years: 19.50    Types: Cigarettes  . Smokeless tobacco: Never Used  Substance and Sexual Activity  .  Alcohol use: No    Alcohol/week: 0.0 oz  . Drug use: No  . Sexual activity: No    Partners: Male  Other Topics Concern  . None  Social History Narrative  . None     Psychiatric Specialty Exam: Physical Exam  Review of Systems  Cardiovascular: Negative for chest pain.  Gastrointestinal: Negative for nausea.  Skin: Negative for rash.    Last menstrual period 11/11/2012.There is no height or weight on file to calculate BMI.  General Appearance: Well Groomed  Patent attorney::  Good  Speech:  Clear and Coherent  Volume:  Normal  Mood: subdued  Affect:  congruent  Thought Process:  Coherent and Goal Directed  Orientation:  Full (Time, Place, and Person)  Thought Content:  WDL  Suicidal Thoughts:  No  Homicidal Thoughts:   No  Memory:  NA  Judgement:  Intact  Insight:  Good and Present  Psychomotor Activity:  Normal  Concentration:  Good  Recall:  Good  Fund of Knowledge:Good  Language: Good  Akathisia:  No  Handed:  Right  AIMS (if indicated):     Assets:  Communication Skills Desire for Improvement Financial Resources/Insurance Housing Leisure Time Physical Health Resilience Social Support Talents/Skills Transportation  ADL's:  Intact  Cognition: WNL  Sleep:        Current Medications: Current Outpatient Medications  Medication Sig Dispense Refill  . buPROPion (WELLBUTRIN SR) 100 MG 12 hr tablet Take 1 tablet (100 mg total) by mouth daily. 30 tablet 1  . cyclobenzaprine (FLEXERIL) 10 MG tablet Reported on 10/07/2015  0  . fexofenadine (ALLEGRA) 180 MG tablet Take 180 mg by mouth daily.    . fluticasone (FLONASE) 50 MCG/ACT nasal spray Place 2 sprays into the nose.    . hydrOXYzine (ATARAX/VISTARIL) 10 MG tablet Take 1 tablet (10 mg total) by mouth 2 (two) times daily as needed for anxiety. 45 tablet 2  . MINIVELLE 0.05 MG/24HR Place 1 patch onto the skin daily. Reported on 10/07/2015    . progesterone (PROMETRIUM) 100 MG capsule   1  . venlafaxine XR (EFFEXOR-XR) 150 MG 24 hr capsule Take 2 capsules (300 mg total) by mouth daily with breakfast. 60 capsule 1   No current facility-administered medications for this visit.     Lab Results: No results found for this or any previous visit (from the past 48 hour(s)).  Physical Findings: AIMS:  CIWA:   COWS:    Treatment Plan Summary: Medication management  MDD; fluctuates. She does not want to incrase meds. Will continue and therapy  GAD: baseline. Continue effexor. Work in therapy  Relationship :conflicts effect mood. She will continue to have ME time ,read books and involve in activities Fu 56m 2:10 PM 10/19/2017

## 2017-11-02 ENCOUNTER — Ambulatory Visit (HOSPITAL_COMMUNITY): Payer: Self-pay | Admitting: Licensed Clinical Social Worker

## 2017-11-14 ENCOUNTER — Ambulatory Visit (HOSPITAL_COMMUNITY): Payer: Self-pay | Admitting: Psychiatry

## 2017-11-17 ENCOUNTER — Ambulatory Visit (HOSPITAL_COMMUNITY): Payer: 59 | Admitting: Licensed Clinical Social Worker

## 2017-11-17 DIAGNOSIS — F341 Dysthymic disorder: Secondary | ICD-10-CM | POA: Diagnosis not present

## 2017-11-17 DIAGNOSIS — F411 Generalized anxiety disorder: Secondary | ICD-10-CM | POA: Diagnosis not present

## 2017-11-17 NOTE — Progress Notes (Signed)
   THERAPIST PROGRESS NOTE  Session Time: 2:05pm-3:00pm  Participation Level: Active  Behavioral Response: CasualAlert  Euthymic  Type of Therapy: Individual Therapy  Treatment Goals addressed: Maintain progress with reducing anxiety/worry  Interventions: Assessment, Treatment Plan Review and Update   Suicidal/Homicidal: Denied both  Therapist Interventions:  Reviewed treatment plan.  Determined patient achieved her goals related to reducing her anxiety and worries.  Encouraged reflection on the progress she has made in the past 6 months.  Collaborated with patient to develop a new treatment goal.    Provided positive feedback regarding how patient has established some specific goals for the future.     Summary:    Reported feeling as though she is managing feelings of anxiety pretty effectively.  Acknowledged she is always going to be on edge to some extent as long as she is living with her husband.   Did not identify any additional concerns she felt needed to be addressed in therapy, however she said she would like to continue to have appointments about once a month because she finds it helpful to have a professional to talk to about stressors she is facing.   Reported she still intends to pursue a part-time job.  Talked about how eventually she expects she'll have to move in with her mom to take care of her.  Her mom has shown signs of confusion, but as of now she is able to care for herself.             Plan: Return in approximately one month.   Diagnosis:  Persistent Depressive Disorder                     Generalized Anxiety Disorder     Darrin LuisSolomon, Sarah A, LCSW 11/17/2017

## 2017-11-28 ENCOUNTER — Ambulatory Visit (HOSPITAL_COMMUNITY): Payer: Self-pay | Admitting: Psychiatry

## 2017-12-21 ENCOUNTER — Ambulatory Visit (HOSPITAL_COMMUNITY): Payer: 59 | Admitting: Licensed Clinical Social Worker

## 2017-12-21 DIAGNOSIS — F411 Generalized anxiety disorder: Secondary | ICD-10-CM | POA: Diagnosis not present

## 2017-12-21 DIAGNOSIS — F341 Dysthymic disorder: Secondary | ICD-10-CM | POA: Diagnosis not present

## 2017-12-21 NOTE — Progress Notes (Signed)
   THERAPIST PROGRESS NOTE  Session Time: 10:05am-11:02am  Participation Level: Active  Behavioral Response: CasualAlert  Appropriately tearful at times  Type of Therapy: Individual Therapy  Treatment Goals addressed: Maintain progress with reducing anxiety/worry  Interventions: Assessment, normalizing thoughts and feelings   Suicidal/Homicidal: Denied both  Therapist Interventions:  Validated patient's recent thoughts and feelings.  Encouraged her to reframe thoughts of failure in terms of an experience she has learned from.        Summary:   Reported feeling much better about her court case.  She was afraid she would have to do a year of jail time.  She has been told that will not be the case.  Admitted at one point last month she was thinking about suicide if she were to get sentenced for jail time.  These thoughts did not linger.     Overall her mood has been pretty good.  Keeping a positive outlook.  Explained she has decided to hold off on getting a job until her case is closed.       Plan: Return in approximately one month.  Treatment plan review is due 02/17/18  Diagnosis:  Persistent Depressive Disorder                     Generalized Anxiety Disorder     Darrin LuisSolomon, Sarah A, LCSW 12/21/2017

## 2018-01-17 ENCOUNTER — Ambulatory Visit (HOSPITAL_COMMUNITY): Payer: 59 | Admitting: Licensed Clinical Social Worker

## 2018-01-17 DIAGNOSIS — F341 Dysthymic disorder: Secondary | ICD-10-CM | POA: Diagnosis not present

## 2018-01-17 DIAGNOSIS — F411 Generalized anxiety disorder: Secondary | ICD-10-CM
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
## 2018-01-18 ENCOUNTER — Ambulatory Visit (HOSPITAL_COMMUNITY): Payer: Self-pay | Admitting: Psychiatry

## 2018-01-23 ENCOUNTER — Encounter (HOSPITAL_COMMUNITY): Payer: Self-pay | Admitting: Psychiatry

## 2018-01-23 ENCOUNTER — Ambulatory Visit (HOSPITAL_COMMUNITY): Payer: 59 | Admitting: Psychiatry

## 2018-01-23 VITALS — BP 128/76 | HR 78 | Ht 63.0 in | Wt 140.0 lb

## 2018-01-23 DIAGNOSIS — Z630 Problems in relationship with spouse or partner: Secondary | ICD-10-CM | POA: Diagnosis not present

## 2018-01-23 DIAGNOSIS — F341 Dysthymic disorder: Secondary | ICD-10-CM

## 2018-01-23 DIAGNOSIS — F17210 Nicotine dependence, cigarettes, uncomplicated: Secondary | ICD-10-CM

## 2018-01-23 DIAGNOSIS — F411 Generalized anxiety disorder: Secondary | ICD-10-CM

## 2018-01-23 DIAGNOSIS — F329 Major depressive disorder, single episode, unspecified: Secondary | ICD-10-CM

## 2018-01-23 MED ORDER — VENLAFAXINE HCL ER 150 MG PO CP24: 300.0000 mg | ORAL_CAPSULE | Freq: Every day | ORAL | 1 refills | Status: DC

## 2018-01-23 MED ORDER — BUPROPION HCL ER (SR) 100 MG PO TB12: 100.0000 mg | ORAL_TABLET | Freq: Every day | ORAL | 1 refills | Status: DC
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
## 2018-02-14 ENCOUNTER — Ambulatory Visit (HOSPITAL_COMMUNITY): Payer: 59 | Admitting: Licensed Clinical Social Worker

## 2018-02-20 ENCOUNTER — Ambulatory Visit (HOSPITAL_COMMUNITY): Payer: Self-pay | Admitting: Psychiatry

## 2018-03-06 ENCOUNTER — Other Ambulatory Visit (HOSPITAL_COMMUNITY): Payer: Self-pay | Admitting: Psychiatry

## 2018-03-31 ENCOUNTER — Encounter (HOSPITAL_COMMUNITY): Payer: Self-pay | Admitting: Psychiatry

## 2018-03-31 ENCOUNTER — Ambulatory Visit (HOSPITAL_COMMUNITY): Payer: 59 | Admitting: Psychiatry

## 2018-03-31 ENCOUNTER — Other Ambulatory Visit: Payer: Self-pay

## 2018-03-31 VITALS — BP 110/62 | HR 83 | Ht 63.0 in | Wt 136.0 lb

## 2018-03-31 DIAGNOSIS — Z630 Problems in relationship with spouse or partner: Secondary | ICD-10-CM

## 2018-03-31 DIAGNOSIS — F411 Generalized anxiety disorder: Secondary | ICD-10-CM

## 2018-03-31 DIAGNOSIS — F341 Dysthymic disorder: Secondary | ICD-10-CM | POA: Diagnosis not present

## 2018-03-31 MED ORDER — VENLAFAXINE HCL ER 150 MG PO CP24: 300.0000 mg | ORAL_CAPSULE | Freq: Every day | ORAL | 1 refills | Status: DC

## 2018-03-31 MED ORDER — HYDROXYZINE HCL 10 MG PO TABS: 10.0000 mg | ORAL_TABLET | Freq: Two times a day (BID) | ORAL | 2 refills | Status: DC | PRN

## 2018-03-31 MED ORDER — BUPROPION HCL ER (SR) 100 MG PO TB12: 100.0000 mg | ORAL_TABLET | Freq: Every day | ORAL | 1 refills | Status: DC
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
## 2018-04-06 ENCOUNTER — Ambulatory Visit (HOSPITAL_COMMUNITY): Payer: 59 | Admitting: Licensed Clinical Social Worker

## 2018-04-06 DIAGNOSIS — Z630 Problems in relationship with spouse or partner: Secondary | ICD-10-CM | POA: Diagnosis not present

## 2018-04-06 DIAGNOSIS — F411 Generalized anxiety disorder: Secondary | ICD-10-CM

## 2018-04-06 DIAGNOSIS — F341 Dysthymic disorder: Secondary | ICD-10-CM | POA: Diagnosis not present
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
## 2018-05-01 ENCOUNTER — Ambulatory Visit (HOSPITAL_COMMUNITY): Payer: Self-pay | Admitting: Licensed Clinical Social Worker

## 2018-05-26 ENCOUNTER — Ambulatory Visit (HOSPITAL_COMMUNITY): Payer: Self-pay | Admitting: Psychiatry

## 2018-06-29 ENCOUNTER — Ambulatory Visit (INDEPENDENT_AMBULATORY_CARE_PROVIDER_SITE_OTHER): Payer: 59 | Admitting: Psychiatry

## 2018-06-29 ENCOUNTER — Encounter (HOSPITAL_COMMUNITY): Payer: Self-pay | Admitting: Psychiatry

## 2018-06-29 VITALS — BP 128/78 | HR 62 | Ht 63.0 in | Wt 139.0 lb

## 2018-06-29 DIAGNOSIS — F411 Generalized anxiety disorder: Secondary | ICD-10-CM | POA: Diagnosis not present

## 2018-06-29 DIAGNOSIS — F341 Dysthymic disorder: Secondary | ICD-10-CM | POA: Diagnosis not present

## 2018-06-29 DIAGNOSIS — Z630 Problems in relationship with spouse or partner: Secondary | ICD-10-CM

## 2018-06-29 MED ORDER — HYDROXYZINE HCL 10 MG PO TABS: 10.0000 mg | ORAL_TABLET | Freq: Two times a day (BID) | ORAL | 2 refills | Status: DC | PRN

## 2018-06-29 MED ORDER — VENLAFAXINE HCL ER 150 MG PO CP24: 300.0000 mg | ORAL_CAPSULE | Freq: Every day | ORAL | 1 refills | Status: DC

## 2018-06-29 MED ORDER — BUPROPION HCL ER (SR) 100 MG PO TB12: 100.0000 mg | ORAL_TABLET | Freq: Every day | ORAL | 1 refills | Status: DC
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
## 2018-07-05 ENCOUNTER — Ambulatory Visit (HOSPITAL_COMMUNITY): Payer: 59 | Admitting: Licensed Clinical Social Worker

## 2018-07-05 DIAGNOSIS — F411 Generalized anxiety disorder: Secondary | ICD-10-CM
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
## 2018-08-02 ENCOUNTER — Ambulatory Visit (HOSPITAL_COMMUNITY): Payer: 59 | Admitting: Licensed Clinical Social Worker

## 2018-08-28 ENCOUNTER — Encounter (HOSPITAL_COMMUNITY): Payer: Self-pay | Admitting: Psychiatry

## 2018-08-28 ENCOUNTER — Other Ambulatory Visit: Payer: Self-pay

## 2018-08-28 ENCOUNTER — Ambulatory Visit (HOSPITAL_COMMUNITY): Payer: 59 | Admitting: Psychiatry

## 2018-08-28 VITALS — BP 110/72 | HR 82 | Ht 63.0 in | Wt 141.0 lb

## 2018-08-28 DIAGNOSIS — F411 Generalized anxiety disorder: Secondary | ICD-10-CM

## 2018-08-28 DIAGNOSIS — F341 Dysthymic disorder: Secondary | ICD-10-CM

## 2018-08-28 DIAGNOSIS — Z630 Problems in relationship with spouse or partner: Secondary | ICD-10-CM

## 2018-08-28 MED ORDER — BUPROPION HCL ER (SR) 100 MG PO TB12: 100.0000 mg | ORAL_TABLET | Freq: Every day | ORAL | 1 refills | Status: DC

## 2018-08-28 MED ORDER — VENLAFAXINE HCL ER 150 MG PO CP24: 300.0000 mg | ORAL_CAPSULE | Freq: Every day | ORAL | 1 refills | Status: DC

## 2018-08-28 NOTE — Addendum Note (Signed)
Addended by: Thresa RossAKHTAR, Terra Aveni on: 08/28/2018 09:42 AM   Modules accepted: Orders
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
## 2018-09-12 ENCOUNTER — Ambulatory Visit (HOSPITAL_COMMUNITY): Payer: 59 | Admitting: Licensed Clinical Social Worker

## 2018-09-12 DIAGNOSIS — Z630 Problems in relationship with spouse or partner: Secondary | ICD-10-CM

## 2018-09-12 DIAGNOSIS — F411 Generalized anxiety disorder: Secondary | ICD-10-CM
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
## 2018-10-18 ENCOUNTER — Ambulatory Visit (HOSPITAL_COMMUNITY): Payer: 59 | Admitting: Licensed Clinical Social Worker

## 2018-10-25 ENCOUNTER — Ambulatory Visit (HOSPITAL_COMMUNITY): Payer: 59 | Admitting: Psychiatry

## 2018-10-27 ENCOUNTER — Ambulatory Visit (INDEPENDENT_AMBULATORY_CARE_PROVIDER_SITE_OTHER): Payer: 59 | Admitting: Psychiatry

## 2018-10-27 ENCOUNTER — Encounter (HOSPITAL_COMMUNITY): Payer: Self-pay | Admitting: Psychiatry

## 2018-10-27 ENCOUNTER — Other Ambulatory Visit: Payer: Self-pay

## 2018-10-27 VITALS — BP 124/70 | HR 69 | Ht 63.0 in | Wt 139.0 lb

## 2018-10-27 DIAGNOSIS — F341 Dysthymic disorder: Secondary | ICD-10-CM | POA: Diagnosis not present

## 2018-10-27 DIAGNOSIS — F411 Generalized anxiety disorder: Secondary | ICD-10-CM

## 2018-10-27 DIAGNOSIS — Z630 Problems in relationship with spouse or partner: Secondary | ICD-10-CM

## 2018-10-27 MED ORDER — VENLAFAXINE HCL ER 150 MG PO CP24: 300.0000 mg | ORAL_CAPSULE | Freq: Every day | ORAL | 1 refills | Status: DC

## 2018-10-27 MED ORDER — BUPROPION HCL ER (SR) 100 MG PO TB12: 100.0000 mg | ORAL_TABLET | Freq: Every day | ORAL | 1 refills | Status: DC
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
## 2018-11-06 ENCOUNTER — Ambulatory Visit (HOSPITAL_COMMUNITY): Payer: 59 | Admitting: Licensed Clinical Social Worker

## 2018-12-18 ENCOUNTER — Ambulatory Visit (HOSPITAL_COMMUNITY): Payer: 59 | Admitting: Psychiatry

## 2018-12-19 ENCOUNTER — Ambulatory Visit (INDEPENDENT_AMBULATORY_CARE_PROVIDER_SITE_OTHER): Payer: 59 | Admitting: Psychiatry

## 2018-12-19 ENCOUNTER — Encounter (HOSPITAL_COMMUNITY): Payer: Self-pay | Admitting: Psychiatry

## 2018-12-19 DIAGNOSIS — F411 Generalized anxiety disorder: Secondary | ICD-10-CM

## 2018-12-19 DIAGNOSIS — F341 Dysthymic disorder: Secondary | ICD-10-CM | POA: Diagnosis not present

## 2018-12-19 DIAGNOSIS — Z630 Problems in relationship with spouse or partner: Secondary | ICD-10-CM

## 2018-12-19 MED ORDER — VENLAFAXINE HCL ER 150 MG PO CP24: 300.0000 mg | ORAL_CAPSULE | Freq: Every day | ORAL | 1 refills | Status: DC

## 2018-12-19 MED ORDER — BUPROPION HCL ER (SR) 100 MG PO TB12: 100.0000 mg | ORAL_TABLET | Freq: Every day | ORAL | 1 refills | Status: DC
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
## 2018-12-21 ENCOUNTER — Other Ambulatory Visit (HOSPITAL_COMMUNITY): Payer: Self-pay | Admitting: Psychiatry

## 2018-12-21 DIAGNOSIS — F411 Generalized anxiety disorder: Secondary | ICD-10-CM

## 2018-12-21 DIAGNOSIS — Z630 Problems in relationship with spouse or partner: Secondary | ICD-10-CM

## 2019-02-26 ENCOUNTER — Ambulatory Visit (HOSPITAL_COMMUNITY): Payer: 59 | Admitting: Psychiatry

## 2019-03-06 ENCOUNTER — Ambulatory Visit (INDEPENDENT_AMBULATORY_CARE_PROVIDER_SITE_OTHER): Payer: 59 | Admitting: Psychiatry

## 2019-03-06 ENCOUNTER — Encounter (HOSPITAL_COMMUNITY): Payer: Self-pay | Admitting: Psychiatry

## 2019-03-06 DIAGNOSIS — F411 Generalized anxiety disorder: Secondary | ICD-10-CM | POA: Diagnosis not present

## 2019-03-06 DIAGNOSIS — F341 Dysthymic disorder: Secondary | ICD-10-CM | POA: Diagnosis not present

## 2019-03-06 DIAGNOSIS — Z630 Problems in relationship with spouse or partner: Secondary | ICD-10-CM

## 2019-03-06 MED ORDER — VENLAFAXINE HCL ER 150 MG PO CP24: 300.0000 mg | ORAL_CAPSULE | Freq: Every day | ORAL | 1 refills | Status: DC

## 2019-03-06 MED ORDER — BUPROPION HCL ER (SR) 100 MG PO TB12: 100.0000 mg | ORAL_TABLET | Freq: Every day | ORAL | 1 refills | Status: DC

## 2019-03-06 NOTE — Progress Notes (Signed)
Patient ID: Mallory [name], female   DOB: [birth year], 63 y.o.   MRN: [id number] [facility] MD Progress Note Tele psych 03/06/2019 10:21 AM CORBY VILLASENOR  MRN:  [id number] Subjective:    I connected with Shelia Media on 03/06/19 at 10:15 AM EDT by telephone and verified that I am speaking with the correct person using two identifiers.   I discussed the limitations, risks, security and privacy concerns of performing an evaluation and management service by telephone and the availability of in person appointments. I also discussed with the patient that there may be a patient responsible charge related to this service. The patient expressed understanding and agreed to proceed.  Staying home, husband still argues, has taken care of him during his chemo meds help and she tries to avoid conflicts spends time in the garden   No side effects  anxiety fluctuates    Anxiety: related to relationship and his sickness Depression: manageable Modifying factors : friends  Principal Problem: GAD Diagnosis:   Patient Active Problem List   Diagnosis Date Noted  . Syncope [R55] 03/11/2017  . Menopause [Z78.0] 12/03/2014  . Marital conflict [B28.4] 13/24/4010  . GAD (generalized anxiety disorder) [F41.1]   . GERD (gastroesophageal reflux disease) [K21.9]   . Peri-menopause [N95.1]   . Headache [R51]   . Generalized anxiety disorder [F41.1] 03/05/2013   Total Time spent with patient: 30 minutes   Past Medical History:  Past Medical History:  Diagnosis Date  . Anxiety   . GAD (generalized anxiety disorder)   . GERD (gastroesophageal reflux disease)   . Headache   . Hx of cardiovascular stress test    ETT-Myoview (03/2014):  EF 58%, no ischemia; normal  . Peri-menopause     Past Surgical History:  Procedure Laterality Date  . CESAREAN SECTION    . DILITATION & CURRETTAGE/HYSTROSCOPY WITH ESSURE     Family History:  Family History  Problem Relation Age of Onset  . Coronary artery disease  Maternal Grandfather   . Cancer Maternal Grandfather   . Coronary artery disease Maternal Grandmother   . Cancer Maternal Grandmother   . Coronary artery disease Paternal Grandfather   . Cancer Paternal Grandfather   . Coronary artery disease Paternal Grandmother   . Cancer Paternal Grandmother   . Diabetes Cousin   . Alcohol abuse Neg Hx   . Anxiety disorder Neg Hx   . Bipolar disorder Neg Hx   . Depression Neg Hx   . Drug abuse Neg Hx   . OCD Neg Hx   . Schizophrenia Neg Hx   . ADD / ADHD Neg Hx    Social History:  Social History   Substance and Sexual Activity  Alcohol Use No  . Alcohol/week: 0.0 standard drinks     Social History   Substance and Sexual Activity  Drug Use No    Social History   Socioeconomic History  . Marital status: Married    Spouse name: Not on file  . Number of children: Not on file  . Years of education: Not on file  . Highest education level: Not on file  Occupational History  . Not on file  Social Needs  . Financial resource strain: Not on file  . Food insecurity    Worry: Not on file    Inability: Not on file  . Transportation needs    Medical: Not on file    Non-medical: Not on file  Tobacco Use  . Smoking status: Current Every  Day Smoker    Packs/day: 0.50    Years: 39.00    Pack years: 19.50    Types: Cigarettes  . Smokeless tobacco: Never Used  Substance and Sexual Activity  . Alcohol use: No    Alcohol/week: 0.0 standard drinks  . Drug use: No  . Sexual activity: Never    Partners: Male  Lifestyle  . Physical activity    Days per week: Not on file    Minutes per session: Not on file  . Stress: Not on file  Relationships  . Social Musicianconnections    Talks on phone: Not on file    Gets together: Not on file    Attends religious service: Not on file    Active member of club or organization: Not on file    Attends meetings of clubs or organizations: Not on file    Relationship status: Not on file  Other Topics Concern   . Not on file  Social History Narrative  . Not on file     Psychiatric Specialty Exam: Physical Exam  Review of Systems  Cardiovascular: Negative for chest pain and palpitations.  Skin: Negative for rash.    There is no height or weight on file to calculate BMI.  General Appearance:  Eye Contact::   Speech:  Clear and Coherent  Volume:  Normal  Mood:f fair  Affect:  congruent  Thought Process:  Coherent and Goal Directed  Orientation:  Full (Time, Place, and Person)  Thought Content:  WDL  Suicidal Thoughts:  No  Homicidal Thoughts:  No  Memory:  NA  Judgement:  Intact  Insight:  Good and Present  Psychomotor Activity:  Normal  Concentration:  Good  Recall:  Good  Fund of Knowledge:Good  Language: Good  Akathisia:  No  Handed:  Right  AIMS (if indicated):     Assets:  Communication Skills Desire for Improvement Financial Resources/Insurance Housing Leisure Time Physical Health Resilience Social Support Talents/Skills Transportation  ADL's:  Intact  Cognition: WNL  Sleep:        Current Medications: Current Outpatient Medications  Medication Sig Dispense Refill  . buPROPion (WELLBUTRIN SR) 100 MG 12 hr tablet Take 1 tablet (100 mg total) by mouth daily. 30 tablet 1  . cyclobenzaprine (FLEXERIL) 10 MG tablet Reported on 10/07/2015  0  . estradiol (VIVELLE-DOT) 0.075 MG/24HR APPLY 1 PATCH TWICE A WEEK    . fexofenadine (ALLEGRA) 180 MG tablet Take 180 mg by mouth daily.    . fluticasone (FLONASE) 50 MCG/ACT nasal spray Place 2 sprays into the nose.    . hydrOXYzine (ATARAX/VISTARIL) 10 MG tablet Take 1 tablet (10 mg total) by mouth 2 (two) times daily as needed for anxiety. 45 tablet 2  . MINIVELLE 0.05 MG/24HR Place 1 patch onto the skin daily. Reported on 10/07/2015    . progesterone (PROMETRIUM) 100 MG capsule   1  . venlafaxine XR (EFFEXOR-XR) 150 MG 24 hr capsule Take 2 capsules (300 mg total) by mouth daily with breakfast. 60 capsule 1   No current  facility-administered medications for this visit.     Lab Results: No results found for this or any previous visit (from the past 48 hour(s)).  Physical Findings: AIMS:  CIWA:   COWS:    Treatment Plan Summary: Medication management  MDD fair continue wellbutrin and effexor GAD: manageable continue effexor, vistaril prn I discussed the assessment and treatment plan with the patient. The patient was provided an opportunity to ask questions and  all were answered. The patient agreed with the plan and demonstrated an understanding of the instructions.   The patient was advised to call back or seek an in-person evaluation if the symptoms worsen or if the condition fails to improve as anticipated.  I provided 15 minutes of non-face-to-face time during this encounter.  Fu [id number].  10:21 AM 03/06/2019

## 2019-05-07 ENCOUNTER — Ambulatory Visit (INDEPENDENT_AMBULATORY_CARE_PROVIDER_SITE_OTHER): Payer: 59 | Admitting: Psychiatry

## 2019-05-07 ENCOUNTER — Encounter (HOSPITAL_COMMUNITY): Payer: Self-pay | Admitting: Psychiatry

## 2019-05-07 DIAGNOSIS — F411 Generalized anxiety disorder: Secondary | ICD-10-CM | POA: Diagnosis not present

## 2019-05-07 DIAGNOSIS — F329 Major depressive disorder, single episode, unspecified: Secondary | ICD-10-CM | POA: Diagnosis not present

## 2019-05-07 DIAGNOSIS — Z630 Problems in relationship with spouse or partner: Secondary | ICD-10-CM | POA: Diagnosis not present

## 2019-05-07 DIAGNOSIS — F341 Dysthymic disorder: Secondary | ICD-10-CM

## 2019-05-07 MED ORDER — HYDROXYZINE HCL 10 MG PO TABS: 10.0000 mg | ORAL_TABLET | Freq: Two times a day (BID) | ORAL | 2 refills | Status: DC | PRN

## 2019-05-07 MED ORDER — VENLAFAXINE HCL ER 150 MG PO CP24: 300.0000 mg | ORAL_CAPSULE | Freq: Every day | ORAL | 1 refills | Status: DC

## 2019-05-07 MED ORDER — BUPROPION HCL ER (SR) 100 MG PO TB12: 100.0000 mg | ORAL_TABLET | Freq: Every day | ORAL | 1 refills | Status: DC
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
## 2019-06-19 ENCOUNTER — Other Ambulatory Visit (HOSPITAL_COMMUNITY): Payer: Self-pay

## 2019-06-19 DIAGNOSIS — Z630 Problems in relationship with spouse or partner: Secondary | ICD-10-CM

## 2019-06-19 DIAGNOSIS — F411 Generalized anxiety disorder: Secondary | ICD-10-CM

## 2019-06-19 MED ORDER — VENLAFAXINE HCL ER 150 MG PO CP24: 300.0000 mg | ORAL_CAPSULE | Freq: Every day | ORAL | 0 refills | Status: DC

## 2019-08-06 ENCOUNTER — Ambulatory Visit (HOSPITAL_COMMUNITY): Payer: 59 | Admitting: Psychiatry

## 2019-08-28 ENCOUNTER — Telehealth (HOSPITAL_COMMUNITY): Payer: Self-pay | Admitting: Psychiatry

## 2019-08-28 MED ORDER — BUPROPION HCL ER (SR) 100 MG PO TB12: 100.0000 mg | ORAL_TABLET | Freq: Every day | ORAL | 0 refills | Status: DC

## 2019-08-28 NOTE — Telephone Encounter (Signed)
sent 

## 2019-08-28 NOTE — Telephone Encounter (Signed)
Pt needs refill on wellbutin  [facility] [address]

## 2019-09-12 ENCOUNTER — Ambulatory Visit (HOSPITAL_COMMUNITY): Payer: 59 | Admitting: Psychiatry

## 2019-09-25 ENCOUNTER — Encounter (HOSPITAL_COMMUNITY): Payer: Self-pay | Admitting: Psychiatry

## 2019-09-25 ENCOUNTER — Other Ambulatory Visit: Payer: Self-pay

## 2019-09-25 ENCOUNTER — Ambulatory Visit (INDEPENDENT_AMBULATORY_CARE_PROVIDER_SITE_OTHER): Payer: 59 | Admitting: Psychiatry

## 2019-09-25 DIAGNOSIS — F339 Major depressive disorder, recurrent, unspecified: Secondary | ICD-10-CM | POA: Diagnosis not present

## 2019-09-25 DIAGNOSIS — F341 Dysthymic disorder: Secondary | ICD-10-CM

## 2019-09-25 DIAGNOSIS — F411 Generalized anxiety disorder: Secondary | ICD-10-CM | POA: Diagnosis not present

## 2019-09-25 DIAGNOSIS — Z630 Problems in relationship with spouse or partner: Secondary | ICD-10-CM | POA: Diagnosis not present

## 2019-09-25 MED ORDER — VENLAFAXINE HCL ER 150 MG PO CP24: 300.0000 mg | ORAL_CAPSULE | Freq: Every day | ORAL | 2 refills | Status: DC

## 2019-09-25 MED ORDER — BUPROPION HCL ER (SR) 100 MG PO TB12: 100.0000 mg | ORAL_TABLET | Freq: Every day | ORAL | 2 refills | Status: DC

## 2019-09-25 NOTE — Progress Notes (Signed)
Patient ID: Mallory [name], female   DOB: April 11, 1956, 64 y.o.   MRN: [id number] [facility] MD Progress Note Tele psych 09/25/2019 10:52 AM Mallory [name]  MRN:  [id number] CC/ depression follow up  HPI   I connected with Mallory [name] on 09/25/19 at 10:45 AM EST by telephone and verified that I am speaking with the correct person using two identifiers.   I discussed the limitations, risks, security and privacy concerns of performing an evaluation and management service by telephone and the availability of in person appointments. I also discussed with the patient that there may be a patient responsible charge related to this service. The patient expressed understanding and agreed to proceed.  Still arguments from husband she walks away  No side effects from meds Feels comfortable with meds dose and tries to garden as ME time   anxiety fluctuates    Anxiety: related to relationship and his sickness Depression: manageable gets upset with relationship Modifying factors : friends  Principal Problem: GAD Diagnosis:   Patient Active Problem List   Diagnosis Date Noted  . Syncope [R55] 03/11/2017  . Menopause [Z78.0] 12/03/2014  . Marital conflict [Z63.0] 06/21/2014  . GAD (generalized anxiety disorder) [F41.1]   . GERD (gastroesophageal reflux disease) [K21.9]   . Peri-menopause [N95.1]   . Headache [R51.9]   . Generalized anxiety disorder [F41.1] 03/05/2013   Total Time spent with patient: 30 minutes   Past Medical History:  Past Medical History:  Diagnosis Date  . Anxiety   . GAD (generalized anxiety disorder)   . GERD (gastroesophageal reflux disease)   . Headache   . Hx of cardiovascular stress test    ETT-Myoview (03/2014):  EF 58%, no ischemia; normal  . Peri-menopause     Past Surgical History:  Procedure Laterality Date  . CESAREAN SECTION    . DILITATION & CURRETTAGE/HYSTROSCOPY WITH ESSURE     Family History:  Family History  Problem Relation Age of Onset  . Coronary  artery disease Maternal Grandfather   . Cancer Maternal Grandfather   . Coronary artery disease Maternal Grandmother   . Cancer Maternal Grandmother   . Coronary artery disease Paternal Grandfather   . Cancer Paternal Grandfather   . Coronary artery disease Paternal Grandmother   . Cancer Paternal Grandmother   . Diabetes Cousin   . Alcohol abuse Neg Hx   . Anxiety disorder Neg Hx   . Bipolar disorder Neg Hx   . Depression Neg Hx   . Drug abuse Neg Hx   . OCD Neg Hx   . Schizophrenia Neg Hx   . ADD / ADHD Neg Hx    Social History:  Social History   Substance and Sexual Activity  Alcohol Use No  . Alcohol/week: 0.0 standard drinks     Social History   Substance and Sexual Activity  Drug Use No    Social History   Socioeconomic History  . Marital status: Married    Spouse name: Not on file  . Number of children: Not on file  . Years of education: Not on file  . Highest education level: Not on file  Occupational History  . Not on file  Tobacco Use  . Smoking status: Current Every Day Smoker    Packs/day: 0.50    Years: 39.00    Pack years: 19.50    Types: Cigarettes  . Smokeless tobacco: Never Used  Substance and Sexual Activity  . Alcohol use: No    Alcohol/week: 0.0  standard drinks  . Drug use: No  . Sexual activity: Never    Partners: Male  Other Topics Concern  . Not on file  Social History Narrative  . Not on file   Social Determinants of Health   Financial Resource Strain:   . Difficulty of Paying Living Expenses: Not on file  Food Insecurity:   . Worried About Programme researcher, broadcasting/film/video in the Last Year: Not on file  . Ran Out of Food in the Last Year: Not on file  Transportation Needs:   . Lack of Transportation (Medical): Not on file  . Lack of Transportation (Non-Medical): Not on file  Physical Activity:   . Days of Exercise per Week: Not on file  . Minutes of Exercise per Session: Not on file  Stress:   . Feeling of Stress : Not on file   Social Connections:   . Frequency of Communication with Friends and Family: Not on file  . Frequency of Social Gatherings with Friends and Family: Not on file  . Attends Religious Services: Not on file  . Active Member of Clubs or Organizations: Not on file  . Attends Banker Meetings: Not on file  . Marital Status: Not on file     Psychiatric Specialty Exam: Physical Exam  Review of Systems  Cardiovascular: Negative for chest pain and palpitations.    There is no height or weight on file to calculate BMI.  General Appearance:  Eye Contact::   Speech:  Clear and Coherent  Volume:  Normal  Mood: fair  Affect:  congruent  Thought Process:  Coherent and Goal Directed  Orientation:  Full (Time, Place, and Person)  Thought Content:  WDL  Suicidal Thoughts:  No  Homicidal Thoughts:  No  Memory:  NA  Judgement:  Intact  Insight:  Good and Present  Psychomotor Activity:  Normal  Concentration:  Good  Recall:  Good  Fund of Knowledge:Good  Language: Good  Akathisia:  No  Handed:  Right  AIMS (if indicated):     Assets:  Communication Skills Desire for Improvement Financial Resources/Insurance Housing Leisure Time Physical Health Resilience Social Support Talents/Skills Transportation  ADL's:  Intact  Cognition: WNL  Sleep:        Current Medications: Current Outpatient Medications  Medication Sig Dispense Refill  . buPROPion (WELLBUTRIN SR) 100 MG 12 hr tablet Take 1 tablet (100 mg total) by mouth daily. 30 tablet 2  . cyclobenzaprine (FLEXERIL) 10 MG tablet Reported on 10/07/2015  0  . estradiol (VIVELLE-DOT) 0.075 MG/24HR APPLY 1 PATCH TWICE A WEEK    . fexofenadine (ALLEGRA) 180 MG tablet Take 180 mg by mouth daily.    . fluticasone (FLONASE) 50 MCG/ACT nasal spray Place 2 sprays into the nose.    . hydrOXYzine (ATARAX/VISTARIL) 10 MG tablet Take 1 tablet (10 mg total) by mouth 2 (two) times daily as needed for anxiety. 45 tablet 2  . MINIVELLE  0.05 MG/24HR Place 1 patch onto the skin daily. Reported on 10/07/2015    . progesterone (PROMETRIUM) 100 MG capsule   1  . venlafaxine XR (EFFEXOR-XR) 150 MG 24 hr capsule Take 2 capsules (300 mg total) by mouth daily with breakfast. 60 capsule 2   No current facility-administered medications for this visit.    Lab Results: No results found for this or any previous visit (from the past 48 hour(s)).  Physical Findings: AIMS:  CIWA:   COWS:    Treatment Plan Summary: Medication management  MDD fair, cointinue effexor, wellbutrin GAD: can get anxious, continue effexor, prn vistaril. Will refer to therapy again I discussed the assessment and treatment plan with the patient. The patient was provided an opportunity to ask questions and all were answered. The patient agreed with the plan and demonstrated an understanding of the instructions.   The patient was advised to call back or seek an in-person evaluation if the symptoms worsen or if the condition fails to improve as anticipated.  I provided 15 minutes of non-face-to-face time during this encounter.  Fu 6m.  10:52 AM 09/25/2019

## 2019-12-25 ENCOUNTER — Encounter (HOSPITAL_COMMUNITY): Payer: Self-pay | Admitting: Psychiatry

## 2019-12-25 ENCOUNTER — Ambulatory Visit (INDEPENDENT_AMBULATORY_CARE_PROVIDER_SITE_OTHER): Payer: Self-pay | Admitting: Psychiatry

## 2019-12-25 DIAGNOSIS — F341 Dysthymic disorder: Secondary | ICD-10-CM

## 2019-12-25 DIAGNOSIS — Z630 Problems in relationship with spouse or partner: Secondary | ICD-10-CM

## 2019-12-25 DIAGNOSIS — F411 Generalized anxiety disorder: Secondary | ICD-10-CM

## 2019-12-25 MED ORDER — HYDROXYZINE HCL 10 MG PO TABS: 10.0000 mg | ORAL_TABLET | Freq: Two times a day (BID) | ORAL | 0 refills | Status: DC | PRN

## 2019-12-25 MED ORDER — BUPROPION HCL ER (SR) 100 MG PO TB12: 100.0000 mg | ORAL_TABLET | Freq: Every day | ORAL | 2 refills | Status: DC

## 2019-12-25 MED ORDER — VENLAFAXINE HCL ER 150 MG PO CP24: 300.0000 mg | ORAL_CAPSULE | Freq: Every day | ORAL | 2 refills | Status: DC

## 2019-12-25 NOTE — Progress Notes (Signed)
Patient ID: Mallory [name], female   DOB: Jun 05, 1956, 64 y.o.   MRN: [id number] [facility] MD Progress Note Tele psych 12/25/2019 10:12 AM ULANDA TACKETT  MRN:  [id number] CC/ depression follow up  HPI   I connected with Cleon Dew on 12/25/19 at 10:00 AM EDT by telephone and verified that I am speaking with the correct person using two identifiers.    I discussed the limitations, risks, security and privacy concerns of performing an evaluation and management service by telephone and the availability of in person appointments. I also discussed with the patient that there may be a patient responsible charge related to this service. The patient expressed understanding and agreed to proceed.  Relationship can be stressful but overall handling fair with meds  Got a puppy not trained yet so she got mad, working on training Vistaril prn helps that moment anxiety    Anxiety: related to relationship and his sickness Depression: manageable gets upset with relationship Modifying factors : friends  Principal Problem: GAD Diagnosis:   Patient Active Problem List   Diagnosis Date Noted  . Syncope [R55] 03/11/2017  . Menopause [Z78.0] 12/03/2014  . Marital conflict [Z63.0] 06/21/2014  . GAD (generalized anxiety disorder) [F41.1]   . GERD (gastroesophageal reflux disease) [K21.9]   . Peri-menopause [N95.1]   . Headache [R51.9]   . Generalized anxiety disorder [F41.1] 03/05/2013   Total Time spent with patient: 30 minutes   Past Medical History:  Past Medical History:  Diagnosis Date  . Anxiety   . GAD (generalized anxiety disorder)   . GERD (gastroesophageal reflux disease)   . Headache   . Hx of cardiovascular stress test    ETT-Myoview (03/2014):  EF 58%, no ischemia; normal  . Peri-menopause     Past Surgical History:  Procedure Laterality Date  . CESAREAN SECTION    . DILITATION & CURRETTAGE/HYSTROSCOPY WITH ESSURE     Family History:  Family History  Problem Relation Age of  Onset  . Coronary artery disease Maternal Grandfather   . Cancer Maternal Grandfather   . Coronary artery disease Maternal Grandmother   . Cancer Maternal Grandmother   . Coronary artery disease Paternal Grandfather   . Cancer Paternal Grandfather   . Coronary artery disease Paternal Grandmother   . Cancer Paternal Grandmother   . Diabetes Cousin   . Alcohol abuse Neg Hx   . Anxiety disorder Neg Hx   . Bipolar disorder Neg Hx   . Depression Neg Hx   . Drug abuse Neg Hx   . OCD Neg Hx   . Schizophrenia Neg Hx   . ADD / ADHD Neg Hx    Social History:  Social History   Substance and Sexual Activity  Alcohol Use No  . Alcohol/week: 0.0 standard drinks     Social History   Substance and Sexual Activity  Drug Use No    Social History   Socioeconomic History  . Marital status: Married    Spouse name: Not on file  . Number of children: Not on file  . Years of education: Not on file  . Highest education level: Not on file  Occupational History  . Not on file  Tobacco Use  . Smoking status: Current Every Day Smoker    Packs/day: 0.50    Years: 39.00    Pack years: 19.50    Types: Cigarettes  . Smokeless tobacco: Never Used  Substance and Sexual Activity  . Alcohol use: No  Alcohol/week: 0.0 standard drinks  . Drug use: No  . Sexual activity: Never    Partners: Male  Other Topics Concern  . Not on file  Social History Narrative  . Not on file   Social Determinants of Health   Financial Resource Strain:   . Difficulty of Paying Living Expenses:   Food Insecurity:   . Worried About Charity fundraiser in the Last Year:   . Arboriculturist in the Last Year:   Transportation Needs:   . Film/video editor (Medical):   Marland Kitchen Lack of Transportation (Non-Medical):   Physical Activity:   . Days of Exercise per Week:   . Minutes of Exercise per Session:   Stress:   . Feeling of Stress :   Social Connections:   . Frequency of Communication with Friends and  Family:   . Frequency of Social Gatherings with Friends and Family:   . Attends Religious Services:   . Active Member of Clubs or Organizations:   . Attends Archivist Meetings:   Marland Kitchen Marital Status:      Psychiatric Specialty Exam: Physical Exam  Review of Systems  Cardiovascular: Negative for chest pain and palpitations.    There is no height or weight on file to calculate BMI.  General Appearance:  Eye Contact::   Speech:  Clear and Coherent  Volume:  Normal  Mood: fair  Affect:  congruent  Thought Process:  Coherent and Goal Directed  Orientation:  Full (Time, Place, and Person)  Thought Content:  WDL  Suicidal Thoughts:  No  Homicidal Thoughts:  No  Memory:  NA  Judgement:  Intact  Insight:  Good and Present  Psychomotor Activity:  Normal  Concentration:  Good  Recall:  Good  Fund of Knowledge:Good  Language: Good  Akathisia:  No  Handed:  Right  AIMS (if indicated):     Assets:  Communication Skills Desire for Improvement Financial Resources/Insurance Housing Leisure Time [place] Talents/Skills Transportation  ADL's:  Intact  Cognition: WNL  Sleep:        Current Medications: Current Outpatient Medications  Medication Sig Dispense Refill  . buPROPion (WELLBUTRIN SR) 100 MG 12 hr tablet Take 1 tablet (100 mg total) by mouth daily. 30 tablet 2  . cyclobenzaprine (FLEXERIL) 10 MG tablet Reported on 10/07/2015  0  . estradiol (VIVELLE-DOT) 0.075 MG/24HR APPLY 1 PATCH TWICE A WEEK    . fexofenadine (ALLEGRA) 180 MG tablet Take 180 mg by mouth daily.    . fluticasone (FLONASE) 50 MCG/ACT nasal spray Place 2 sprays into the nose.    . hydrOXYzine (ATARAX/VISTARIL) 10 MG tablet Take 1 tablet (10 mg total) by mouth 2 (two) times daily as needed for anxiety. 45 tablet 0  . MINIVELLE 0.05 MG/24HR Place 1 patch onto the skin daily. Reported on 10/07/2015    . progesterone (PROMETRIUM) 100 MG capsule   1  . venlafaxine  XR (EFFEXOR-XR) 150 MG 24 hr capsule Take 2 capsules (300 mg total) by mouth daily with breakfast. 60 capsule 2   No current facility-administered medications for this visit.    Lab Results: No results found for this or any previous visit (from the past 48 hour(s)).  Physical Findings: AIMS:  CIWA:   COWS:    Treatment Plan Summary: Medication management  MDD: doing fair, continue wellbutrin, effexor GAD: can get anxious, continue effexor, prn vistaril.   I discussed the assessment and treatment plan with the  patient. The patient was provided an opportunity to ask questions and all were answered. The patient agreed with the plan and demonstrated an understanding of the instructions.   The patient was advised to call back or seek an in-person evaluation if the symptoms worsen or if the condition fails to improve as anticipated.  I provided 15 minutes of non-face-to-face time during this encounter.  Fu 32m.  10:12 AM 12/25/2019

## 2020-03-25 ENCOUNTER — Telehealth (INDEPENDENT_AMBULATORY_CARE_PROVIDER_SITE_OTHER): Payer: BLUE CROSS/BLUE SHIELD | Admitting: Psychiatry

## 2020-03-25 ENCOUNTER — Other Ambulatory Visit: Payer: Self-pay

## 2020-03-25 ENCOUNTER — Encounter (HOSPITAL_COMMUNITY): Payer: Self-pay | Admitting: Psychiatry

## 2020-03-25 DIAGNOSIS — F341 Dysthymic disorder: Secondary | ICD-10-CM | POA: Diagnosis not present

## 2020-03-25 DIAGNOSIS — F411 Generalized anxiety disorder: Secondary | ICD-10-CM

## 2020-03-25 DIAGNOSIS — Z630 Problems in relationship with spouse or partner: Secondary | ICD-10-CM

## 2020-03-25 MED ORDER — VENLAFAXINE HCL ER 150 MG PO CP24: 300.0000 mg | ORAL_CAPSULE | Freq: Every day | ORAL | 2 refills | Status: DC

## 2020-03-25 MED ORDER — BUPROPION HCL ER (SR) 100 MG PO TB12: 100.0000 mg | ORAL_TABLET | Freq: Every day | ORAL | 2 refills | Status: DC

## 2020-03-25 NOTE — Progress Notes (Signed)
Patient ID: Mallory [name], female   DOB: 02/10/1956, 64 y.o.   MRN: [id number] [facility] MD Progress Note Tele psych 03/25/2020 10:38 AM Mallory [name]  MRN:  [id number] CC/ depression follow up  HPI      I connected with Cleon Dew on 03/25/20 at 10:30 AM EDT by telephone and verified that I am speaking with the correct person using two identifiers.  I discussed the limitations, risks, security and privacy concerns of performing an evaluation and management service by telephone and the availability of in person appointments. I also discussed with the patient that there may be a patient responsible charge related to this service. The patient expressed understanding and agreed to proceed. Patient location home Provider location home  Relationship can be stressful but overall handling fair with meds  Try to distract from worries and takes care of parents     Anxiety: related to relationship and his sickness Depression: manageable gets upset with relationship Modifying factors :friends   Principal Problem: GAD Diagnosis:   Patient Active Problem List   Diagnosis Date Noted  . Syncope [R55] 03/11/2017  . Menopause [Z78.0] 12/03/2014  . Marital conflict [Z63.0] 06/21/2014  . GAD (generalized anxiety disorder) [F41.1]   . GERD (gastroesophageal reflux disease) [K21.9]   . Peri-menopause [N95.1]   . Headache [R51.9]   . Generalized anxiety disorder [F41.1] 03/05/2013   Total Time spent with patient: 30 minutes   Past Medical History:  Past Medical History:  Diagnosis Date  . Anxiety   . GAD (generalized anxiety disorder)   . GERD (gastroesophageal reflux disease)   . Headache   . Hx of cardiovascular stress test    ETT-Myoview (03/2014):  EF 58%, no ischemia; normal  . Peri-menopause     Past Surgical History:  Procedure Laterality Date  . CESAREAN SECTION    . DILITATION & CURRETTAGE/HYSTROSCOPY WITH ESSURE     Family History:  Family History  Problem Relation Age of  Onset  . Coronary artery disease Maternal Grandfather   . Cancer Maternal Grandfather   . Coronary artery disease Maternal Grandmother   . Cancer Maternal Grandmother   . Coronary artery disease Paternal Grandfather   . Cancer Paternal Grandfather   . Coronary artery disease Paternal Grandmother   . Cancer Paternal Grandmother   . Diabetes Cousin   . Alcohol abuse Neg Hx   . Anxiety disorder Neg Hx   . Bipolar disorder Neg Hx   . Depression Neg Hx   . Drug abuse Neg Hx   . OCD Neg Hx   . Schizophrenia Neg Hx   . ADD / ADHD Neg Hx    Social History:  Social History   Substance and Sexual Activity  Alcohol Use No  . Alcohol/week: 0.0 standard drinks     Social History   Substance and Sexual Activity  Drug Use No    Social History   Socioeconomic History  . Marital status: Married    Spouse name: Not on file  . Number of children: Not on file  . Years of education: Not on file  . Highest education level: Not on file  Occupational History  . Not on file  Tobacco Use  . Smoking status: Current Every Day Smoker    Packs/day: 0.50    Years: 39.00    Pack years: 19.50    Types: Cigarettes  . Smokeless tobacco: Never Used  Vaping Use  . Vaping Use: Never used  Substance and Sexual Activity  .  Alcohol use: No    Alcohol/week: 0.0 standard drinks  . Drug use: No  . Sexual activity: Never    Partners: Male  Other Topics Concern  . Not on file  Social History Narrative  . Not on file   Social Determinants of Health   Financial Resource Strain:   . Difficulty of Paying Living Expenses:   Food Insecurity:   . Worried About Programme researcher, broadcasting/film/video in the Last Year:   . Barista in the Last Year:   Transportation Needs:   . Freight forwarder (Medical):   Marland Kitchen Lack of Transportation (Non-Medical):   Physical Activity:   . Days of Exercise per Week:   . Minutes of Exercise per Session:   Stress:   . Feeling of Stress :   Social Connections:   . Frequency  of Communication with Friends and Family:   . Frequency of Social Gatherings with Friends and Family:   . Attends Religious Services:   . Active Member of Clubs or Organizations:   . Attends Banker Meetings:   Marland Kitchen Marital Status:      Psychiatric Specialty Exam: Physical Exam  Review of Systems  Cardiovascular: Negative for chest pain and palpitations.    There is no height or weight on file to calculate BMI.  General Appearance:  Eye Contact::   Speech:  Clear and Coherent  Volume:  Normal  Mood: fair  Affect:  congruent  Thought Process:  Coherent and Goal Directed  Orientation:  Full (Time, Place, and Person)  Thought Content:  WDL  Suicidal Thoughts:  No  Homicidal Thoughts:  No  Memory:  NA  Judgement:  Intact  Insight:  Good and Present  Psychomotor Activity:  Normal  Concentration:  Good  Recall:  Good  Fund of Knowledge:Good  Language: Good  Akathisia:  No  Handed:  Right  AIMS (if indicated):     Assets:  Communication Skills Desire for Improvement Financial Resources/Insurance Housing Leisure Time Physical Health Resilience Social Support Talents/Skills Transportation  ADL's:  Intact  Cognition: WNL  Sleep:        Current Medications: Current Outpatient Medications  Medication Sig Dispense Refill  . buPROPion (WELLBUTRIN SR) 100 MG 12 hr tablet Take 1 tablet (100 mg total) by mouth daily. 30 tablet 2  . cyclobenzaprine (FLEXERIL) 10 MG tablet Reported on 10/07/2015  0  . estradiol (VIVELLE-DOT) 0.075 MG/24HR APPLY 1 PATCH TWICE A WEEK    . fexofenadine (ALLEGRA) 180 MG tablet Take 180 mg by mouth daily.    . fluticasone (FLONASE) 50 MCG/ACT nasal spray Place 2 sprays into the nose.    . hydrOXYzine (ATARAX/VISTARIL) 10 MG tablet Take 1 tablet (10 mg total) by mouth 2 (two) times daily as needed for anxiety. 45 tablet 0  . MINIVELLE 0.05 MG/24HR Place 1 patch onto the skin daily. Reported on 10/07/2015    . progesterone (PROMETRIUM)  100 MG capsule   1  . venlafaxine XR (EFFEXOR-XR) 150 MG 24 hr capsule Take 2 capsules (300 mg total) by mouth daily with breakfast. 60 capsule 2   No current facility-administered medications for this visit.    Lab Results: No results found for this or any previous visit (from the past 48 hour(s)).  Physical Findings: AIMS:  CIWA:   COWS:    Treatment Plan Summary: Medication management  MDD: manageable, continue effexor, wellbutrin GAD: can get anxious, continue effexor, prn vistaril.   I discussed the assessment  and treatment plan with the patient. The patient was provided an opportunity to ask questions and all were answered. The patient agreed with the plan and demonstrated an understanding of the instructions.   The patient was advised to call back or seek an in-person evaluation if the symptoms worsen or if the condition fails to improve as anticipated.  I provided 15 minutes of non-face-to-face time during this encounter.  Fu 74m.  10:38 AM 03/25/2020

## 2020-06-26 ENCOUNTER — Encounter (HOSPITAL_COMMUNITY): Payer: Self-pay | Admitting: Psychiatry

## 2020-06-26 ENCOUNTER — Telehealth (INDEPENDENT_AMBULATORY_CARE_PROVIDER_SITE_OTHER): Payer: BLUE CROSS/BLUE SHIELD | Admitting: Psychiatry

## 2020-06-26 DIAGNOSIS — Z630 Problems in relationship with spouse or partner: Secondary | ICD-10-CM | POA: Diagnosis not present

## 2020-06-26 DIAGNOSIS — F411 Generalized anxiety disorder: Secondary | ICD-10-CM

## 2020-06-26 MED ORDER — VENLAFAXINE HCL ER 150 MG PO CP24: 300.0000 mg | ORAL_CAPSULE | Freq: Every day | ORAL | 2 refills | Status: DC

## 2020-06-26 MED ORDER — BUPROPION HCL ER (SR) 100 MG PO TB12: 100.0000 mg | ORAL_TABLET | Freq: Every day | ORAL | 2 refills | Status: DC

## 2020-06-26 MED ORDER — HYDROXYZINE HCL 10 MG PO TABS: 10.0000 mg | ORAL_TABLET | Freq: Two times a day (BID) | ORAL | 0 refills | Status: DC | PRN

## 2020-06-26 NOTE — Progress Notes (Signed)
Patient ID: Mallory [name], female   DOB: [birth year], 64 y.o.   MRN: [id number] [facility] MD Progress Note Tele psych 06/26/2020 3:45 PM DANIALLE DEMENT  MRN:  [id number] CC/ depression follow up  HPI    I connected with Mallory [name] on 06/26/20 at  3:00 PM EDT by telephone and verified that I am speaking with the correct person using two identifiers.  I discussed the limitations, risks, security and privacy concerns of performing an evaluation and management service by telephone and the availability of in person appointments. I also discussed with the patient that there may be a patient responsible charge related to this service. The patient expressed understanding and agreed to proceed. Patient location home Provider location home office  Had taken off from hormones due to insurance cost reason , feels moody, anxiuos, plans to get back by end of month and make appointment with OB Relationship can be stressful but overall handling fair with meds Stays at home feels stuck as husband was sick and can be negative  Try to distract from worries and takes care of parents  Was feeling anxiety and we had called vistaril in . She takes bid if need   Anxiety: related to relationship and his sickness Depression: manageable gets upset with relationship Modifying factors : some friends  Principal Problem: GAD Diagnosis:   Patient Active Problem List   Diagnosis Date Noted  . Syncope [R55] 03/11/2017  . Menopause [Z78.0] 12/03/2014  . Marital conflict [Z63.0] 06/21/2014  . GAD (generalized anxiety disorder) [F41.1]   . GERD (gastroesophageal reflux disease) [K21.9]   . Peri-menopause [N95.1]   . Headache [R51.9]   . Generalized anxiety disorder [F41.1] 03/05/2013   Total Time spent with patient: 30 minutes   Past Medical History:  Past Medical History:  Diagnosis Date  . Anxiety   . GAD (generalized anxiety disorder)   . GERD (gastroesophageal reflux disease)   . Headache   . Hx of  cardiovascular stress test    ETT-Myoview (03/2014):  EF 58%, no ischemia; normal  . Peri-menopause     Past Surgical History:  Procedure Laterality Date  . CESAREAN SECTION    . DILITATION & CURRETTAGE/HYSTROSCOPY WITH ESSURE     Family History:  Family History  Problem Relation Age of Onset  . Coronary artery disease Maternal Grandfather   . Cancer Maternal Grandfather   . Coronary artery disease Maternal Grandmother   . Cancer Maternal Grandmother   . Coronary artery disease Paternal Grandfather   . Cancer Paternal Grandfather   . Coronary artery disease Paternal Grandmother   . Cancer Paternal Grandmother   . Diabetes Cousin   . Alcohol abuse Neg Hx   . Anxiety disorder Neg Hx   . Bipolar disorder Neg Hx   . Depression Neg Hx   . Drug abuse Neg Hx   . OCD Neg Hx   . Schizophrenia Neg Hx   . ADD / ADHD Neg Hx    Social History:  Social History   Substance and Sexual Activity  Alcohol Use No  . Alcohol/week: 0.0 standard drinks     Social History   Substance and Sexual Activity  Drug Use No    Social History   Socioeconomic History  . Marital status: Married    Spouse name: Not on file  . Number of children: Not on file  . Years of education: Not on file  . Highest education level: Not on file  Occupational History  .  Not on file  Tobacco Use  . Smoking status: Current Every Day Smoker    Packs/day: 0.50    Years: 39.00    Pack years: 19.50    Types: Cigarettes  . Smokeless tobacco: Never Used  Vaping Use  . Vaping Use: Never used  Substance and Sexual Activity  . Alcohol use: No    Alcohol/week: 0.0 standard drinks  . Drug use: No  . Sexual activity: Never    Partners: Male  Other Topics Concern  . Not on file  Social History Narrative  . Not on file   Social Determinants of Health   Financial Resource Strain:   . Difficulty of Paying Living Expenses: Not on file  Food Insecurity:   . Worried About Programme researcher, broadcasting/film/video in the Last Year:  Not on file  . Ran Out of Food in the Last Year: Not on file  Transportation Needs:   . Lack of Transportation (Medical): Not on file  . Lack of Transportation (Non-Medical): Not on file  Physical Activity:   . Days of Exercise per Week: Not on file  . Minutes of Exercise per Session: Not on file  Stress:   . Feeling of Stress : Not on file  Social Connections:   . Frequency of Communication with Friends and Family: Not on file  . Frequency of Social Gatherings with Friends and Family: Not on file  . Attends Religious Services: Not on file  . Active Member of Clubs or Organizations: Not on file  . Attends Banker Meetings: Not on file  . Marital Status: Not on file     Psychiatric Specialty Exam: Physical Exam  Review of Systems  Cardiovascular: Negative for chest pain and palpitations.    There is no height or weight on file to calculate BMI.  General Appearance:  Eye Contact::   Speech:  Clear and Coherent  Volume:  Normal  Mood: somewhat subdued  Affect:  congruent  Thought Process:  Coherent and Goal Directed  Orientation:  Full (Time, Place, and Person)  Thought Content:  WDL  Suicidal Thoughts:  No  Homicidal Thoughts:  No  Memory:  NA  Judgement:  Intact  Insight:  Good and Present  Psychomotor Activity:  Normal  Concentration:  Good  Recall:  Good  Fund of Knowledge:Good  Language: Good  Akathisia:  No  Handed:  Right  AIMS (if indicated):     Assets:  Communication Skills Desire for Improvement Financial Resources/Insurance Housing Leisure Time Physical Health Resilience Social Support Talents/Skills Transportation  ADL's:  Intact  Cognition: WNL  Sleep:        Current Medications: Current Outpatient Medications  Medication Sig Dispense Refill  . buPROPion (WELLBUTRIN SR) 100 MG 12 hr tablet Take 1 tablet (100 mg total) by mouth daily. 30 tablet 2  . cyclobenzaprine (FLEXERIL) 10 MG tablet Reported on 10/07/2015  0  . estradiol  (VIVELLE-DOT) 0.075 MG/24HR APPLY 1 PATCH TWICE A WEEK    . fexofenadine (ALLEGRA) 180 MG tablet Take 180 mg by mouth daily.    . fluticasone (FLONASE) 50 MCG/ACT nasal spray Place 2 sprays into the nose.    . hydrOXYzine (ATARAX/VISTARIL) 10 MG tablet Take 1 tablet (10 mg total) by mouth 2 (two) times daily as needed for anxiety. 45 tablet 0  . MINIVELLE 0.05 MG/24HR Place 1 patch onto the skin daily. Reported on 10/07/2015    . progesterone (PROMETRIUM) 100 MG capsule   1  . venlafaxine  XR (EFFEXOR-XR) 150 MG 24 hr capsule Take 2 capsules (300 mg total) by mouth daily with breakfast. 60 capsule 2   No current facility-administered medications for this visit.    Lab Results: No results found for this or any previous visit (from the past 48 hour(s)).  Physical Findings: AIMS:  CIWA:   COWS:    Treatment Plan Summary: Medication management  MDD: subdued due to circumstances, continue wellbutrin, discussed to avail therapy  Have some concern with hot flashes at night, she will make appointment with OB to get back on hormones GAD: can get anxious, continue effexor, prn vistaril.   I discussed the assessment and treatment plan with the patient. The patient was provided an opportunity to ask questions and all were answered. The patient agreed with the plan and demonstrated an understanding of the instructions.   The patient was advised to call back or seek an in-person evaluation if the symptoms worsen or if the condition fails to improve as anticipated.  I provided 15 minutes of non-face-to-face time during this encounter.  Fu 83m 3:45 PM 06/26/2020

## 2020-08-26 ENCOUNTER — Telehealth (INDEPENDENT_AMBULATORY_CARE_PROVIDER_SITE_OTHER): Payer: BLUE CROSS/BLUE SHIELD | Admitting: Psychiatry

## 2020-08-26 ENCOUNTER — Encounter (HOSPITAL_COMMUNITY): Payer: Self-pay | Admitting: Psychiatry

## 2020-08-26 DIAGNOSIS — Z630 Problems in relationship with spouse or partner: Secondary | ICD-10-CM

## 2020-08-26 DIAGNOSIS — F341 Dysthymic disorder: Secondary | ICD-10-CM

## 2020-08-26 DIAGNOSIS — F411 Generalized anxiety disorder: Secondary | ICD-10-CM

## 2020-08-26 MED ORDER — BUPROPION HCL ER (SR) 100 MG PO TB12: 100.0000 mg | ORAL_TABLET | Freq: Every day | ORAL | 2 refills | Status: DC

## 2020-08-26 MED ORDER — VENLAFAXINE HCL ER 150 MG PO CP24: 300.0000 mg | ORAL_CAPSULE | Freq: Every day | ORAL | 2 refills | Status: DC

## 2020-08-26 NOTE — Progress Notes (Signed)
Patient ID: Mallory [name], female   DOB: 08-18-1956, 64 y.o.   MRN: [id number] [facility] MD Progress Note Tele psych 08/26/2020 3:42 PM LEMA HEINKEL  MRN:  [id number] CC/ depression follow up  HPI    I connected with Cleon Dew on 08/26/20 at  3:30 PM EST by telephone and verified that I am speaking with the correct person using two identifiers.   I discussed the limitations, risks, security and privacy concerns of performing an evaluation and management service by telephone and the availability of in person appointments. I also discussed with the patient that there may be a patient responsible charge related to this service. The patient expressed understanding and agreed to proceed. Patient location home Provider location home office   Doing better since back on hormones, less hot flashes and mood improved  Relationship can be stressful but overall handling fair with meds Husband is sick, she helps him out, he had cancer  Try to distract from worries and takes care of parents Anxiety: related to relationship and his sickness Depression: manageable gets upset with relationship Modifying factors : some friends  Principal Problem: GAD Diagnosis:   Patient Active Problem List   Diagnosis Date Noted  . Syncope [R55] 03/11/2017  . Menopause [Z78.0] 12/03/2014  . Marital conflict [Z63.0] 06/21/2014  . GAD (generalized anxiety disorder) [F41.1]   . GERD (gastroesophageal reflux disease) [K21.9]   . Peri-menopause [N95.1]   . Headache [R51.9]   . Generalized anxiety disorder [F41.1] 03/05/2013   Total Time spent with patient: 30 minutes   Past Medical History:  Past Medical History:  Diagnosis Date  . Anxiety   . GAD (generalized anxiety disorder)   . GERD (gastroesophageal reflux disease)   . Headache   . Hx of cardiovascular stress test    ETT-Myoview (03/2014):  EF 58%, no ischemia; normal  . Peri-menopause     Past Surgical History:  Procedure Laterality Date  .  CESAREAN SECTION    . DILITATION & CURRETTAGE/HYSTROSCOPY WITH ESSURE     Family History:  Family History  Problem Relation Age of Onset  . Coronary artery disease Maternal Grandfather   . Cancer Maternal Grandfather   . Coronary artery disease Maternal Grandmother   . Cancer Maternal Grandmother   . Coronary artery disease Paternal Grandfather   . Cancer Paternal Grandfather   . Coronary artery disease Paternal Grandmother   . Cancer Paternal Grandmother   . Diabetes Cousin   . Alcohol abuse Neg Hx   . Anxiety disorder Neg Hx   . Bipolar disorder Neg Hx   . Depression Neg Hx   . Drug abuse Neg Hx   . OCD Neg Hx   . Schizophrenia Neg Hx   . ADD / ADHD Neg Hx    Social History:  Social History   Substance and Sexual Activity  Alcohol Use No  . Alcohol/week: 0.0 standard drinks     Social History   Substance and Sexual Activity  Drug Use No    Social History   Socioeconomic History  . Marital status: Married    Spouse name: Not on file  . Number of children: Not on file  . Years of education: Not on file  . Highest education level: Not on file  Occupational History  . Not on file  Tobacco Use  . Smoking status: Current Every Day Smoker    Packs/day: 0.50    Years: 39.00    Pack years: 19.50  Types: Cigarettes  . Smokeless tobacco: Never Used  Vaping Use  . Vaping Use: Never used  Substance and Sexual Activity  . Alcohol use: No    Alcohol/week: 0.0 standard drinks  . Drug use: No  . Sexual activity: Never    Partners: Male  Other Topics Concern  . Not on file  Social History Narrative  . Not on file   Social Determinants of Health   Financial Resource Strain: Not on file  Food Insecurity: Not on file  Transportation Needs: Not on file  Physical Activity: Not on file  Stress: Not on file  Social Connections: Not on file     Psychiatric Specialty Exam: Physical Exam  Review of Systems  Cardiovascular: Negative for chest pain and  palpitations.    There is no height or weight on file to calculate BMI.  General Appearance:  Eye Contact::   Speech:  Clear and Coherent  Volume:  Normal  Mood:better  Affect:  congruent  Thought Process:  Coherent and Goal Directed  Orientation:  Full (Time, Place, and Person)  Thought Content:  WDL  Suicidal Thoughts:  No  Homicidal Thoughts:  No  Memory:  NA  Judgement:  Intact  Insight:  Good and Present  Psychomotor Activity:  Normal  Concentration:  Good  Recall:  Good  Fund of Knowledge:Good  Language: Good  Akathisia:  No  Handed:  Right  AIMS (if indicated):     Assets:  Communication Skills Desire for Improvement Financial Resources/Insurance Housing Leisure Time Physical Health Resilience Social Support Talents/Skills Transportation  ADL's:  Intact  Cognition: WNL  Sleep:        Current Medications: Current Outpatient Medications  Medication Sig Dispense Refill  . buPROPion (WELLBUTRIN SR) 100 MG 12 hr tablet Take 1 tablet (100 mg total) by mouth daily. 30 tablet 2  . cyclobenzaprine (FLEXERIL) 10 MG tablet Reported on 10/07/2015  0  . estradiol (VIVELLE-DOT) 0.075 MG/24HR APPLY 1 PATCH TWICE A WEEK    . fexofenadine (ALLEGRA) 180 MG tablet Take 180 mg by mouth daily.    . fluticasone (FLONASE) 50 MCG/ACT nasal spray Place 2 sprays into the nose.    . hydrOXYzine (ATARAX/VISTARIL) 10 MG tablet Take 1 tablet (10 mg total) by mouth 2 (two) times daily as needed for anxiety. 45 tablet 0  . MINIVELLE 0.05 MG/24HR Place 1 patch onto the skin daily. Reported on 10/07/2015    . progesterone (PROMETRIUM) 100 MG capsule   1  . venlafaxine XR (EFFEXOR-XR) 150 MG 24 hr capsule Take 2 capsules (300 mg total) by mouth daily with breakfast. 60 capsule 2   No current facility-administered medications for this visit.    Lab Results: No results found for this or any previous visit (from the past 48 hour(s)).  Physical Findings: AIMS:  CIWA:   COWS:     Treatment Plan Summary: Medication management  MDD: better, continue effexor and mes GAD: can get anxious, continue effexor, prn vistaril. Adding hormones have helped   I discussed the assessment and treatment plan with the patient. The patient was provided an opportunity to ask questions and all were answered. The patient agreed with the plan and demonstrated an understanding of the instructions.   The patient was advised to call back or seek an in-person evaluation if the symptoms worsen or if the condition fails to improve as anticipated.  I provided 15 minutes of non-face-to-face time during this encounter.  Fu 3 m 3:42 PM 08/26/2020

## 2020-11-24 ENCOUNTER — Telehealth (HOSPITAL_COMMUNITY): Payer: Self-pay | Admitting: Psychiatry

## 2020-12-10 ENCOUNTER — Ambulatory Visit (HOSPITAL_COMMUNITY): Payer: Medicare Other | Admitting: Licensed Clinical Social Worker

## 2021-04-20 ENCOUNTER — Telehealth (INDEPENDENT_AMBULATORY_CARE_PROVIDER_SITE_OTHER): Payer: Medicare (Managed Care) | Admitting: Psychiatry

## 2021-04-20 ENCOUNTER — Encounter (HOSPITAL_COMMUNITY): Payer: Self-pay | Admitting: Psychiatry

## 2021-04-20 DIAGNOSIS — Z630 Problems in relationship with spouse or partner: Secondary | ICD-10-CM

## 2021-04-20 DIAGNOSIS — F411 Generalized anxiety disorder: Secondary | ICD-10-CM | POA: Diagnosis not present

## 2021-04-20 DIAGNOSIS — F341 Dysthymic disorder: Secondary | ICD-10-CM | POA: Diagnosis not present

## 2021-04-20 MED ORDER — BUPROPION HCL ER (SR) 100 MG PO TB12: 100.0000 mg | ORAL_TABLET | Freq: Every day | ORAL | 2 refills | Status: DC

## 2021-04-20 MED ORDER — VENLAFAXINE HCL ER 150 MG PO CP24: 300.0000 mg | ORAL_CAPSULE | Freq: Every day | ORAL | 2 refills | Status: DC

## 2021-04-20 NOTE — Progress Notes (Signed)
Patient ID: Mallory [name], female   DOB: [birth year], 65 y.o.   MRN: [id number] [facility] MD Progress Note Tele psych 04/20/2021 4:23 PM Mallory [name]  MRN:  [id number] CC/ depression follow up  HPI  Virtual Visit via Telephone Note  I connected with Mallory [name] on 04/20/21 at  4:15 PM EDT by telephone and verified that I am speaking with the correct person using two identifiers.  Location: Patient: home Provider: home office   I discussed the limitations, risks, security and privacy concerns of performing an evaluation and management service by telephone and the availability of in person appointments. I also discussed with the patient that there may be a patient responsible charge related to this service. The patient expressed understanding and agreed to proceed.   I discussed the assessment and treatment plan with the patient. The patient was provided an opportunity to ask questions and all were answered. The patient agreed with the plan and demonstrated an understanding of the instructions.   The patient was advised to call back or seek an in-person evaluation if the symptoms worsen or if the condition fails to improve as anticipated.  I provided 12 minutes of non-face-to-face time during this encounter.  Patient is doing manageable she has started therapy and her therapist listens to her and that is helped as well at home still conflicts with her husband and she tries to avoid communicating with him as it turns into an argument for negativity  Overall manageable with the medications She has a puppy Vistaril prn helps that moment anxiety    Anxiety: related to relationship and his sickness Depression: manageable gets upset with relationship Modifying factors : Friends Principal Problem: GAD Diagnosis:   Patient Active Problem List   Diagnosis Date Noted   Syncope [R55] 03/11/2017   Menopause [Z78.0] 12/03/2014   Marital conflict [Z63.0] 06/21/2014   GAD (generalized anxiety  disorder) [F41.1]    GERD (gastroesophageal reflux disease) [K21.9]    Peri-menopause [N95.1]    Headache [R51.9]    Generalized anxiety disorder [F41.1] 03/05/2013   Total Time spent with patient: 30 minutes   Past Medical History:  Past Medical History:  Diagnosis Date   Anxiety    GAD (generalized anxiety disorder)    GERD (gastroesophageal reflux disease)    Headache    Hx of cardiovascular stress test    ETT-Myoview (03/2014):  EF 58%, no ischemia; normal   Peri-menopause     Past Surgical History:  Procedure Laterality Date   CESAREAN SECTION     DILITATION & CURRETTAGE/HYSTROSCOPY WITH ESSURE     Family History:  Family History  Problem Relation Age of Onset   Coronary artery disease Maternal Grandfather    Cancer Maternal Grandfather    Coronary artery disease Maternal Grandmother    Cancer Maternal Grandmother    Coronary artery disease Paternal Grandfather    Cancer Paternal Grandfather    Coronary artery disease Paternal Grandmother    Cancer Paternal Grandmother    Diabetes Cousin    Alcohol abuse Neg Hx    Anxiety disorder Neg Hx    Bipolar disorder Neg Hx    Depression Neg Hx    Drug abuse Neg Hx    OCD Neg Hx    Schizophrenia Neg Hx    ADD / ADHD Neg Hx    Social History:  Social History   Substance and Sexual Activity  Alcohol Use No   Alcohol/week: 0.0 standard drinks     Social  History   Substance and Sexual Activity  Drug Use No    Social History   Socioeconomic History   Marital status: Married    Spouse name: Not on file   Number of children: Not on file   Years of education: Not on file   Highest education level: Not on file  Occupational History   Not on file  Tobacco Use   Smoking status: Every Day    Packs/day: 0.50    Years: 39.00    Pack years: 19.50    Types: Cigarettes   Smokeless tobacco: Never  Vaping Use   Vaping Use: Never used  Substance and Sexual Activity   Alcohol use: No    Alcohol/week: 0.0 standard  drinks   Drug use: No   Sexual activity: Never    Partners: Male  Other Topics Concern   Not on file  Social History Narrative   Not on file   Social Determinants of Health   Financial Resource Strain: Not on file  Food Insecurity: Not on file  Transportation Needs: Not on file  Physical Activity: Not on file  Stress: Not on file  Social Connections: Not on file     Psychiatric Specialty Exam: Physical Exam  Review of Systems  Cardiovascular:  Negative for chest pain and palpitations.   There is no height or weight on file to calculate BMI.  General Appearance:  Eye Contact::   Speech:  Clear and Coherent  Volume:  Normal  Mood: fair  Affect:  congruent  Thought Process:  Coherent and Goal Directed  Orientation:  Full (Time, Place, and Person)  Thought Content:  WDL  Suicidal Thoughts:  No  Homicidal Thoughts:  No  Memory:  NA  Judgement:  Intact  Insight:  Good and Present  Psychomotor Activity:  Normal  Concentration:  Good  Recall:  Good  Fund of Knowledge:Good  Language: Good  Akathisia:  No  Handed:  Right  AIMS (if indicated):     Assets:  Communication Skills Desire for Improvement Financial Resources/Insurance Housing Leisure Time Physical Health Resilience Social Support Talents/Skills Transportation  ADL's:  Intact  Cognition: WNL  Sleep:        Current Medications: Current Outpatient Medications  Medication Sig Dispense Refill   buPROPion ER (WELLBUTRIN SR) 100 MG 12 hr tablet Take 1 tablet (100 mg total) by mouth daily. 30 tablet 2   cyclobenzaprine (FLEXERIL) 10 MG tablet Reported on 10/07/2015  0   estradiol (VIVELLE-DOT) 0.075 MG/24HR APPLY 1 PATCH TWICE A WEEK     fexofenadine (ALLEGRA) 180 MG tablet Take 180 mg by mouth daily.     fluticasone (FLONASE) 50 MCG/ACT nasal spray Place 2 sprays into the nose.     hydrOXYzine (ATARAX/VISTARIL) 10 MG tablet Take 1 tablet (10 mg total) by mouth 2 (two) times daily as needed for anxiety.  45 tablet 0   MINIVELLE 0.05 MG/24HR Place 1 patch onto the skin daily. Reported on 10/07/2015     progesterone (PROMETRIUM) 100 MG capsule   1   venlafaxine XR (EFFEXOR-XR) 150 MG 24 hr capsule Take 2 capsules (300 mg total) by mouth daily with breakfast. 60 capsule 2   No current facility-administered medications for this visit.    Lab Results: No results found for this or any previous visit (from the past 48 hour(s)).  Physical Findings: AIMS:  CIWA:   COWS:    Treatment Plan Summary: Medication management Prior documentation reviewed MDD: Doing fair or manageable continue  Wellbutrin and Effexor continue therapy that has helped her to improve self-esteem  GAD:  Fluctuates depending on the relationship stress overall therapy is helping continue Effexor and hydroxyzine   Fu 65m.  4:23 PM 04/20/2021

## 2021-04-22 ENCOUNTER — Other Ambulatory Visit: Payer: Self-pay

## 2021-04-22 ENCOUNTER — Ambulatory Visit (INDEPENDENT_AMBULATORY_CARE_PROVIDER_SITE_OTHER): Payer: Medicare (Managed Care) | Admitting: Cardiovascular Disease

## 2021-04-22 ENCOUNTER — Encounter: Payer: Self-pay | Admitting: Cardiovascular Disease

## 2021-04-22 DIAGNOSIS — Z720 Tobacco use: Secondary | ICD-10-CM

## 2021-04-22 DIAGNOSIS — R0789 Other chest pain: Secondary | ICD-10-CM | POA: Diagnosis not present

## 2021-04-22 MED ORDER — METOPROLOL TARTRATE 100 MG PO TABS: ORAL_TABLET | ORAL | 0 refills | Status: DC

## 2021-04-22 NOTE — Progress Notes (Signed)
04/22/2021 Mallory [name]   November 21, 1955  [id number]  Primary Physician Dow Adolph, PA Primary Cardiologist: Runell Gess MD Nicholes Calamity, [place]  HPI:  ASHAUNA [name] is a 65 y.o.  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  Unfortunately, her husband is chronically ill with cancer and other chronic illnesses.  She was referred by her PCP for cardiovascular evaluation because of 2 episodes of syncope while attending a funeral .I last saw her in the office 07/20/2017.  Her risk factors include 20-pack-years of tobacco abuse currently smoking one half pack per day. Otherwise, she has no cardiac risk factors. She's never had a heart attack or stroke and denies chest pain or shortness of breath. She was at a funeral and had 2 episodes of syncope in fairly rapid succession. He was hot out although she said she was hydrated. There is no nausea or other vagal associated symptoms. She had a 2-D echo which was essentially normal and an event monitor that was unrevealing.  I did get a 2D echo which was essentially normal as was an event monitor.  She has had no further episodes of syncope.  Since I saw her 4 years ago she has developed progressive fatigue, dyspnea and chest pressure over the last several weeks.   Current Meds  Medication Sig   buPROPion ER (WELLBUTRIN SR) 100 MG 12 hr tablet Take 1 tablet (100 mg total) by mouth daily.   estradiol (CLIMARA - DOSED IN MG/24 HR) 0.075 mg/24hr patch Place onto the skin.   famotidine (PEPCID) 20 MG tablet Take 20 mg by mouth in the morning and at bedtime.   fluticasone (FLONASE) 50 MCG/ACT nasal spray Place 2 sprays into the nose.   progesterone (PROMETRIUM) 100 MG capsule Take 100 mg by mouth at bedtime.   venlafaxine XR (EFFEXOR-XR) 150 MG 24 hr capsule Take 2 capsules (300 mg total) by mouth daily with breakfast.     Allergies  Allergen Reactions   Banana  Itching   Latex     Social History   Socioeconomic History   Marital status: Married    Spouse name: Not on file   Number of children: Not on file   Years of education: Not on file   Highest education level: Not on file  Occupational History   Not on file  Tobacco Use   Smoking status: Every Day    Packs/day: 0.50    Years: 39.00    Pack years: 19.50    Types: Cigarettes   Smokeless tobacco: Never  Vaping Use   Vaping Use: Never used  Substance and Sexual Activity   Alcohol use: No    Alcohol/week: 0.0 standard drinks   Drug use: No   Sexual activity: Never    Partners: Male  Other Topics Concern   Not on file  Social History Narrative   Not on file   Social Determinants of Health   Financial Resource Strain: Not on file  Food Insecurity: Not on file  Transportation Needs: Not on file  Physical Activity: Not on file  Stress: Not on file  Social Connections: Not on file  Intimate Partner Violence: Not on file     Review of Systems: General: negative for chills, fever, night sweats or weight changes.  Cardiovascular: negative for chest pain, dyspnea on exertion, edema, orthopnea, palpitations, paroxysmal nocturnal dyspnea or shortness of breath Dermatological: negative for  rash Respiratory: negative for cough or wheezing Urologic: negative for hematuria Abdominal: negative for nausea, vomiting, diarrhea, bright red blood per rectum, melena, or hematemesis Neurologic: negative for visual changes, syncope, or dizziness All other systems reviewed and are otherwise negative except as noted above.    Blood pressure 140/78, pulse 77, height 5\' 3"  (1.6 m), weight 151 lb (68.5 kg), last menstrual period 11/11/2012, SpO2 99 %.  General appearance: alert and no distress Neck: no adenopathy, no carotid bruit, no JVD, supple, symmetrical, trachea midline, and thyroid not enlarged, symmetric, no tenderness/mass/nodules Lungs: clear to auscultation bilaterally Heart:  regular rate and rhythm, S1, S2 normal, no murmur, click, rub or gallop Extremities: extremities normal, atraumatic, no cyanosis or edema Pulses: 2+ and symmetric Skin: Skin color, texture, turgor normal. No rashes or lesions Neurologic: Grossly normal  EKG sinus rhythm at 79 with septal Q waves and low limb voltage.  I personally reviewed this EKG.  ASSESSMENT AND PLAN:   Tobacco abuse Ongoing tobacco abuse of 1/2 pack/day recalcitrant risk factor modification.  Atypical chest pain Ms. Orser has noticed atypical chest pain and dyspnea over the last several weeks.  I am going get a 2D echo and a coronary CTA to further evaluate.     Fayrene Fearing MD FACP,FACC,FAHA, [facility] 04/22/2021 12:05 PM

## 2021-04-22 NOTE — Assessment & Plan Note (Signed)
Ongoing tobacco abuse of 1/2 pack/day recalcitrant risk factor modification 

## 2021-04-22 NOTE — Patient Instructions (Addendum)
Medication Instructions:  Take Metoprolol Tartrate 2 hours before CT when scheduled.   *If you need a refill on your cardiac medications before your next appointment, please call your pharmacy*   Lab Work: BMET today   If you have labs (blood work) drawn today and your tests are completely normal, you will receive your results only by: MyChart Message (if you have MyChart) OR A paper copy in the mail If you have any lab test that is abnormal or we need to change your treatment, we will call you to review the results.   Testing/Procedures: Your physician has requested that you have cardiac CT. Cardiac computed tomography (CT) is a painless test that uses an x-ray machine to take clear, detailed pictures of your heart. For further information please visit [email or web address] Please follow instruction sheet as given.  Echocardiogram - Your physician has requested that you have an echocardiogram. Echocardiography is a painless test that uses sound waves to create images of your heart. It provides your doctor with information about the size and shape of your heart and how well your heart's chambers and valves are working. This procedure takes approximately one hour. There are no restrictions for this procedure. This will be performed at our [facility] location - [address], [address].    Follow-Up: At [facility], you and your health needs are our priority.  As part of our continuing mission to provide you with exceptional heart care, we have created designated Provider Care Teams.  These Care Teams include your primary Cardiologist (physician) and Advanced Practice Providers (APPs -  Physician Assistants and Nurse Practitioners) who all work together to provide you with the care you need, when you need it.  We recommend signing up for the patient portal called "MyChart".  Sign up information is provided on this After Visit Summary.  MyChart is used to connect with patients for Virtual  Visits (Telemedicine).  Patients are able to view lab/test results, encounter notes, upcoming appointments, etc.  Non-urgent messages can be sent to your provider as well.   To learn more about what you can do with MyChart, go to [email or web address].    Your next appointment:   6 month(s)  The format for your next appointment:   In Person  Provider:   Nanetta Batty, MD   Other Instructions   Your cardiac CT will be scheduled at one of the below locations:   [facility] [address] [place], [place] [address] [phone]  If scheduled at [facility] ([facility]), please arrive at the [facility] main entrance (entrance A) of [facility] 30 minutes prior to test start time. Proceed to the [facility] Radiology Department (first floor) to check-in and test prep.  Please follow these instructions carefully (unless otherwise directed):  Hold all erectile dysfunction medications at least 3 days (72 hrs) prior to test.  On the Night Before the Test: Be sure to Drink plenty of water. Do not consume any caffeinated/decaffeinated beverages or chocolate 12 hours prior to your test. Do not take any antihistamines 12 hours prior to your test.  On the Day of the Test: Drink plenty of water until 1 hour prior to the test. Do not eat any food 4 hours prior to the test. You may take your regular medications prior to the test.  Take metoprolol (Lopressor) two hours prior to test. HOLD Furosemide/Hydrochlorothiazide morning of the test. FEMALES- please wear underwire-free bra if available, avoid dresses & tight clothing  After the Test: Drink plenty of water. After receiving IV contrast, you may experience a mild flushed feeling. This is normal. On occasion, you may experience a mild rash up to 24 hours after the test. This is not dangerous. If this occurs, you can take Benadryl 25 mg and increase your fluid intake. If you experience trouble breathing, this can  be serious. If it is severe call 911 IMMEDIATELY. If it is mild, please call our office. If you take any of these medications: Glipizide/Metformin, Avandament, Glucavance, please do not take 48 hours after completing test unless otherwise instructed.  Please allow 2-4 weeks for scheduling of routine cardiac CTs. Some insurance companies require a pre-authorization which may delay scheduling of this test.   For non-scheduling related questions, please contact the cardiac imaging nurse navigator should you have any questions/concerns: Rockwell Alexandria, Cardiac Imaging Nurse Navigator Larey Brick, Cardiac Imaging Nurse Navigator [facility] Direct Office Dial: [phone]   For scheduling needs, including cancellations and rescheduling, please call [place], [phone].

## 2021-04-22 NOTE — Assessment & Plan Note (Signed)
Mallory [name] has noticed atypical chest pain and dyspnea over the last several weeks.  I am going get a 2D echo and a coronary CTA to further evaluate.

## 2021-04-23 LAB — BASIC METABOLIC PANEL
BUN/Creatinine Ratio: 14 (ref 12–28)
BUN: 9 mg/dL (ref 8–27)
CO2: 22 mmol/L (ref 20–29)
Calcium: 8.6 mg/dL — ABNORMAL LOW (ref 8.7–10.3)
Chloride: 106 mmol/L (ref 96–106)
Creatinine, Ser: 0.63 mg/dL (ref 0.57–1.00)
Glucose: 76 mg/dL (ref 65–99)
Potassium: 5.1 mmol/L (ref 3.5–5.2)
Sodium: 140 mmol/L (ref 134–144)
eGFR: 98 mL/min/{1.73_m2} (ref >59)

## 2021-05-01 ENCOUNTER — Telehealth (HOSPITAL_COMMUNITY): Payer: Self-pay | Admitting: Emergency Medicine

## 2021-05-01 NOTE — Telephone Encounter (Signed)
Attempted to call patient regarding upcoming cardiac CT appointment. °Left message on voicemail with name and callback number °Arturo Sofranko RN Navigator Cardiac Imaging °[facility] °[phone] Office °[phone] Cell ° °

## 2021-05-04 ENCOUNTER — Encounter (HOSPITAL_COMMUNITY): Payer: Self-pay

## 2021-05-04 ENCOUNTER — Telehealth (HOSPITAL_COMMUNITY): Payer: Self-pay | Admitting: Emergency Medicine

## 2021-05-04 ENCOUNTER — Other Ambulatory Visit: Payer: Self-pay

## 2021-05-04 ENCOUNTER — Ambulatory Visit (HOSPITAL_COMMUNITY)
Admission: RE | Admit: 2021-05-04 | Discharge: 2021-05-04 | Disposition: A | Discharge status: Home / Self Care | Payer: Medicare (Managed Care) | Source: Ambulatory Visit | Attending: Cardiovascular Disease | Admitting: Cardiovascular Disease

## 2021-05-04 DIAGNOSIS — R0789 Other chest pain: Secondary | ICD-10-CM

## 2021-05-04 NOTE — Telephone Encounter (Signed)
Pt returning phone call regarding upcoming cardiac imaging study; pt verbalizes understanding of appt date/time, parking situation and where to check in, pre-test NPO status and medications ordered, and verified current allergies; name and call back number provided for further questions should they arise Mallory Alexandria RN Navigator Cardiac Imaging [facility] [phone] office [phone] cell  100mg  metoprolol

## 2021-05-12 ENCOUNTER — Telehealth (HOSPITAL_COMMUNITY): Payer: Self-pay | Admitting: *Deleted

## 2021-05-12 ENCOUNTER — Other Ambulatory Visit (HOSPITAL_COMMUNITY): Payer: Self-pay | Admitting: *Deleted

## 2021-05-12 MED ORDER — METOPROLOL TARTRATE 100 MG PO TABS: ORAL_TABLET | ORAL | 0 refills | Status: DC

## 2021-05-12 NOTE — Telephone Encounter (Signed)
Attempted to call patient regarding upcoming cardiac CT appointment. °Left message on voicemail with name and callback number ° °Mallory Solano RN Navigator Cardiac Imaging °[facility] °[phone] Office °[phone] Cell ° °

## 2021-05-12 NOTE — Telephone Encounter (Signed)
Patient returning call regarding upcoming cardiac imaging study; pt verbalizes understanding of appt date/time, parking situation and where to check in, pre-test NPO status and medications ordered, and verified current allergies; name and call back number provided for further questions should they arise  Jery Hollern RN Navigator Cardiac Imaging [facility] [phone] office [phone] cell  Patient to take 100mg metoprolol tartrate two hours prior to cardiac CT. 

## 2021-05-13 ENCOUNTER — Ambulatory Visit (HOSPITAL_COMMUNITY)
Admission: RE | Admit: 2021-05-13 | Discharge: 2021-05-13 | Disposition: A | Discharge status: Home / Self Care | Payer: Medicare (Managed Care) | Source: Ambulatory Visit | Attending: Cardiovascular Disease | Admitting: Cardiovascular Disease

## 2021-05-13 ENCOUNTER — Other Ambulatory Visit: Payer: Self-pay

## 2021-05-13 ENCOUNTER — Ambulatory Visit (HOSPITAL_BASED_OUTPATIENT_CLINIC_OR_DEPARTMENT_OTHER): Payer: Medicare (Managed Care)

## 2021-05-13 DIAGNOSIS — R0789 Other chest pain: Secondary | ICD-10-CM | POA: Diagnosis not present

## 2021-05-13 DIAGNOSIS — Z006 Encounter for examination for normal comparison and control in clinical research program: Secondary | ICD-10-CM

## 2021-05-13 DIAGNOSIS — I2510 Atherosclerotic heart disease of native coronary artery without angina pectoris: Secondary | ICD-10-CM | POA: Diagnosis not present

## 2021-05-13 LAB — ECHOCARDIOGRAM COMPLETE
Area-P 1/2: 3.21 cm2
S' Lateral: 3.1 cm

## 2021-05-13 IMAGING — CT CT HEART MORP W/ CTA COR W/ SCORE W/ CA W/CM &/OR W/O CM
4 of 7 series · 8 of 20 positions shown, 9 images · non-contrast
Comparison: None.
COMPARISON: None.

Addendum:
EXAM:
OVER-READ INTERPRETATION  CT CHEST

The following report is an over-read performed by radiologist Dr.
Ramiar Tiger [REDACTED] on 05/13/2021. This
over-read does not include interpretation of cardiac or coronary
anatomy or pathology. The coronary calcium score/coronary CTA
interpretation by the cardiologist is attached.
CLINICAL DATA: 65 year with chest pain
Cardiac/Coronary  CTA
TECHNIQUE: The patient was scanned on a Phillips Force scanner.

[Series 6: best diast 74 % · axial · 0.37mm/px · z∈[-338,-296]mm · 2 of 316 slices shown, 3 images]
[im 106/316  vessel]
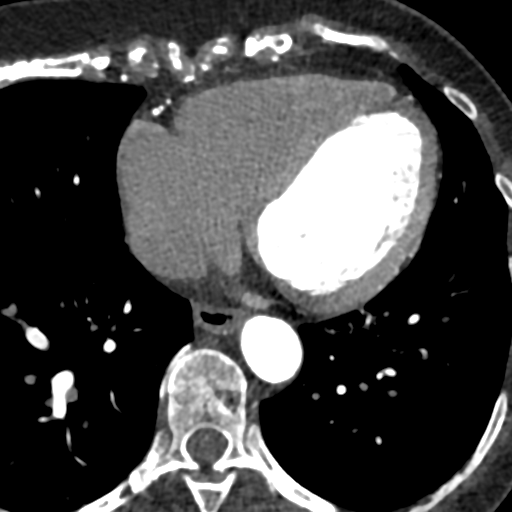
[im 106/316  lung]
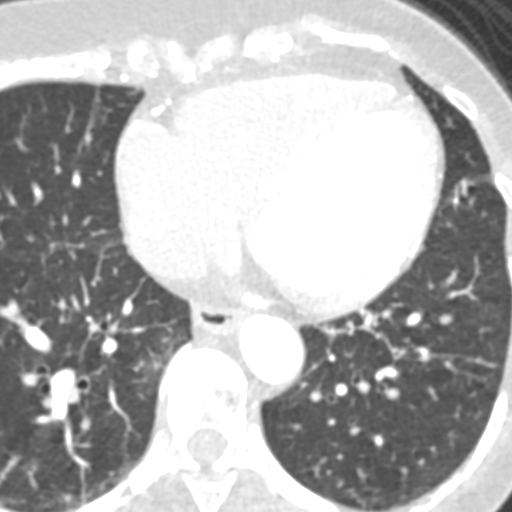
[im 211/316  vessel]
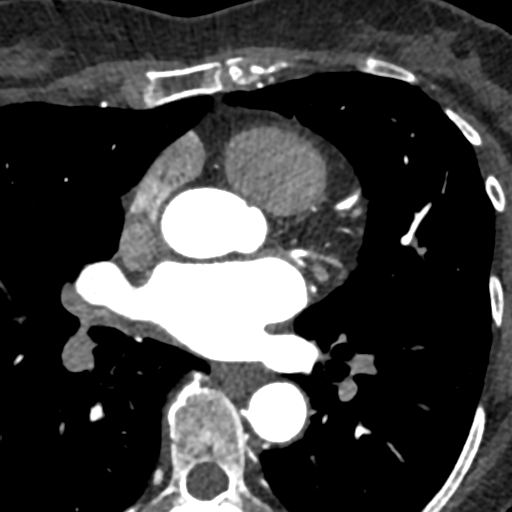

[Series 7: best syst 30 % · axial · 0.37mm/px · z∈[-338,-296]mm · 2 of 316 slices shown]
[im 106/316  vessel]
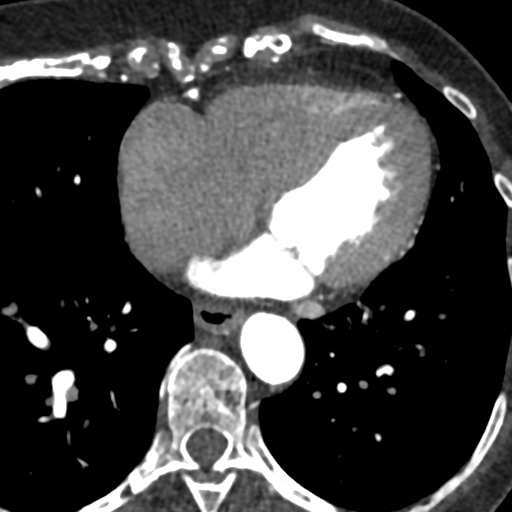
[im 211/316  vessel]
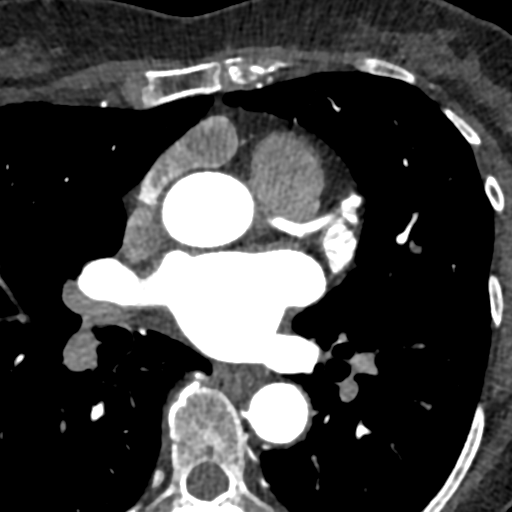

[Series 8: ts diast sharp 74 % · axial · 0.37mm/px · z∈[-338,-296]mm · 2 of 316 slices shown]
[im 106/316  lung]
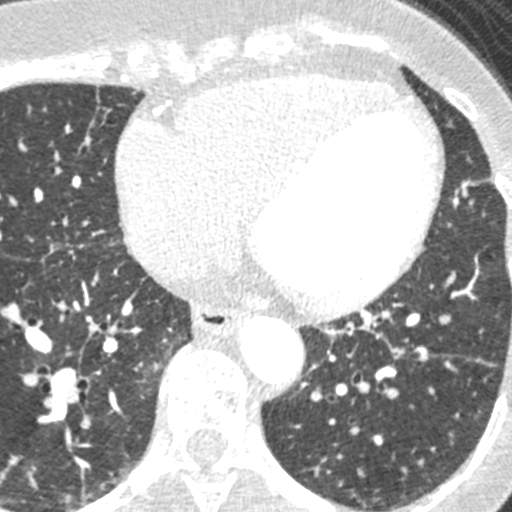
[im 211/316  lung]
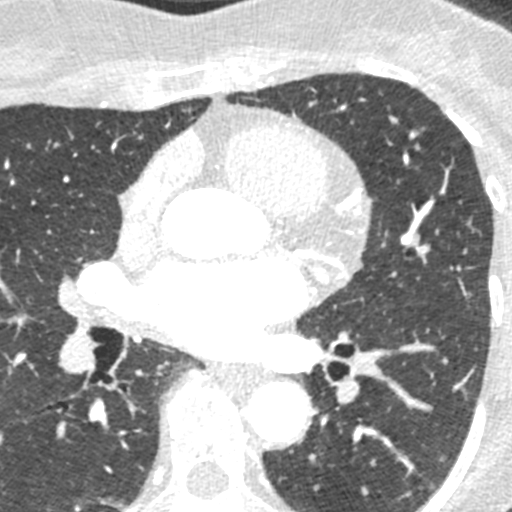

[Series 9: ts syst sharp 30 % · axial · 0.37mm/px · z∈[-338,-296]mm · 2 of 316 slices shown]
[im 106/316  lung]
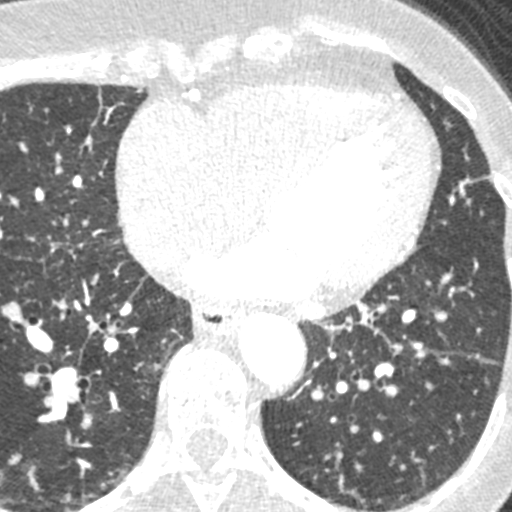
[im 211/316  lung]
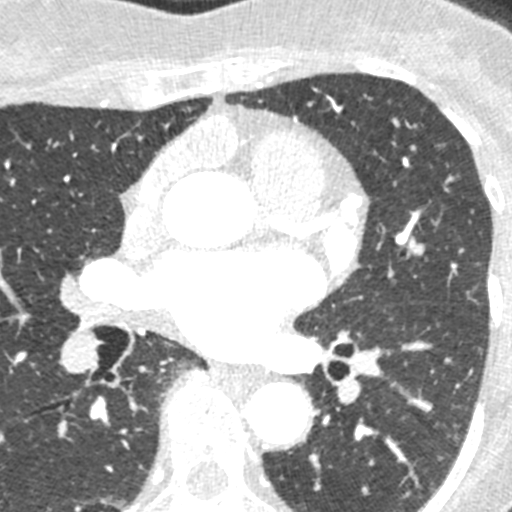

[8 of 20 positions shown; findings below may reference images not displayed]

FINDINGS: Within the visualized portions of the thorax there are no suspicious
appearing pulmonary nodules or masses, there is no acute
consolidative airspace disease, no pleural effusions, no
pneumothorax and no lymphadenopathy. Visualized portions of the
upper abdomen are unremarkable. There are no aggressive appearing
lytic or blastic lesions noted in the visualized portions of the
skeleton.
IMPRESSION: 1. No significant incidental noncardiac findings are noted.
FINDINGS: A 100 kV prospective scan was triggered in the descending thoracic
aorta at 111 HU's. Axial non-contrast 3 mm slices were carried out
through the heart. The data set was analyzed on a dedicated work
station and scored using the Agatson method. Gantry rotation speed
was 250 msecs and collimation was .6 mm. No beta blockade and 0.8 mg
of sl NTG was given. The 3D data set was reconstructed in 5%
intervals of the 67-82 % of the R-R cycle. Diastolic phases were
analyzed on a dedicated work station using MPR, MIP and VRT modes.
The patient received 80 cc of contrast.

Aorta:  Normal size.  No calcifications.  No dissection.

Aortic Valve:  Trileaflet.  No calcifications.

Coronary Arteries:  Normal coronary origin.  Right dominance.

RCA is a large dominant artery that gives rise to PDA and PLA. There
is no plaque.

Left main is a large artery that gives rise to LAD and LCX arteries.

LAD is a large vessel that has a proximal non calcified plaque of
50-69%, sending for FFR. Distal LAD is small and wraps around apex.

LCX is a moderate sized non-dominant artery that gives rise to one
large OM1 branch. There is no plaque.

CALCIUM SCORE:
CALCIUM SCORE
LM: 0

LAD: 9

PETRAS IR: 0

RCA: 0

Total: 9

Percentile: 59

Other findings:

Normal pulmonary vein drainage into the left atrium.

Normal left atrial appendage without a thrombus.

Normal size of the pulmonary artery.

Please see radiology report for non cardiac findings.
IMPRESSION: 1. Coronary calcium score of 9. This was 59 percentile for age and
sex matched control.

2. Normal coronary origin with right dominance.

3. Proximal LAD stenosis of 50-69% non calcified plaque, sending for
FFR.

*** End of Addendum ***
EXAM:
OVER-READ INTERPRETATION  CT CHEST

The following report is an over-read performed by radiologist Dr.
Ramiar Tiger [REDACTED] on 05/13/2021. This
over-read does not include interpretation of cardiac or coronary
anatomy or pathology. The coronary calcium score/coronary CTA
interpretation by the cardiologist is attached.
FINDINGS: Within the visualized portions of the thorax there are no suspicious
appearing pulmonary nodules or masses, there is no acute
consolidative airspace disease, no pleural effusions, no
pneumothorax and no lymphadenopathy. Visualized portions of the
upper abdomen are unremarkable. There are no aggressive appearing
lytic or blastic lesions noted in the visualized portions of the
skeleton.
IMPRESSION: 1. No significant incidental noncardiac findings are noted.

## 2021-05-13 MED ORDER — IOHEXOL 350 MG/ML SOLN
100.0000 mL | Freq: Once | INTRAVENOUS | Status: AC | PRN
  Administered 2021-05-13: 100 mL via INTRAVENOUS

## 2021-05-13 MED ORDER — NITROGLYCERIN 0.4 MG SL SUBL
SUBLINGUAL_TABLET | SUBLINGUAL | Status: AC
  Administered 2021-05-13: 0.8 mg via SUBLINGUAL
  Filled 2021-05-13: qty 2

## 2021-05-13 MED ORDER — NITROGLYCERIN 0.4 MG SL SUBL: 0.8000 mg | SUBLINGUAL_TABLET | Freq: Once | SUBLINGUAL | Status: AC

## 2021-05-13 NOTE — Research (Signed)
IDENTIFY Informed Consent                  Subject Name:Mallory [name]   Subject met inclusion and exclusion criteria.  The informed consent form, study requirements and expectations were reviewed with the subject and questions and concerns were addressed prior to the signing of the consent form.  The subject verbalized understanding of the trial requirements.  The subject agreed to participate in the IDENTIFY trial and signed the informed consent at 12:04pm on 05/13/21.  The informed consent was obtained prior to performance of any protocol-specific procedures for the subject.  A copy of the signed informed consent was given to the subject and a copy was placed in the subject's medical record.    Marylou Mccoy, Research Coordinator

## 2021-05-20 ENCOUNTER — Telehealth: Payer: Self-pay | Admitting: Cardiovascular Disease

## 2021-05-20 NOTE — Telephone Encounter (Signed)
Patient returning call for echo results. 

## 2021-05-20 NOTE — Telephone Encounter (Signed)
  Sandi Mariscal, RN  05/19/2021  2:18 PM EDT     Left a message for the patient to call back.   Runell Gess, MD  05/13/2021  6:12 PM EDT     Nl LV systolic FXN with G1DD   Left message to call back

## 2021-05-21 NOTE — Telephone Encounter (Signed)
Mallory [name] is returning Melinda's call.

## 2021-05-21 NOTE — Telephone Encounter (Signed)
Attempted to contact patient to discuss results.  Patient did not answer. LVM to call back.  Left call back number.

## 2021-05-22 NOTE — Telephone Encounter (Signed)
   Pt is returning call, she gave her phone# [phone] for callback

## 2021-05-22 NOTE — Telephone Encounter (Signed)
Patient aware of echo/cardiac CTA results.   FFR is pending -- routed to MD to see if report is back

## 2021-05-22 NOTE — Telephone Encounter (Signed)
Called patient to discuss results again. Advised patient to call back to discuss further. Left call back number.   Will remove from triage.

## 2021-05-25 ENCOUNTER — Other Ambulatory Visit (HOSPITAL_COMMUNITY): Payer: Self-pay | Admitting: Emergency Medicine

## 2021-05-25 ENCOUNTER — Ambulatory Visit (HOSPITAL_COMMUNITY)
Admission: RE | Admit: 2021-05-25 | Discharge: 2021-05-25 | Disposition: A | Discharge status: Home / Self Care | Payer: Medicare (Managed Care) | Source: Ambulatory Visit | Attending: Cardiology | Admitting: Cardiology

## 2021-05-25 DIAGNOSIS — R931 Abnormal findings on diagnostic imaging of heart and coronary circulation: Secondary | ICD-10-CM | POA: Diagnosis present

## 2021-05-25 DIAGNOSIS — R079 Chest pain, unspecified: Secondary | ICD-10-CM

## 2021-05-25 DIAGNOSIS — I2510 Atherosclerotic heart disease of native coronary artery without angina pectoris: Secondary | ICD-10-CM

## 2021-05-25 NOTE — Telephone Encounter (Signed)
Patient called w/FFR results

## 2021-05-25 NOTE — Telephone Encounter (Signed)
Runell Gess, MD  Lennie Odor, RN; Lindell Spar, RN Caller: Unspecified (5 days ago,  4:41 PM) FFR LAD Nl. Please let pt know.   JJB

## 2021-08-20 ENCOUNTER — Ambulatory Visit (HOSPITAL_COMMUNITY): Payer: Medicare (Managed Care) | Admitting: Psychiatry

## 2021-09-29 ENCOUNTER — Ambulatory Visit (HOSPITAL_COMMUNITY): Payer: Medicare (Managed Care) | Admitting: Psychiatry

## 2021-10-06 ENCOUNTER — Telehealth (HOSPITAL_COMMUNITY): Payer: Self-pay | Admitting: Psychiatry

## 2021-10-06 DIAGNOSIS — Z630 Problems in relationship with spouse or partner: Secondary | ICD-10-CM

## 2021-10-06 DIAGNOSIS — F411 Generalized anxiety disorder: Secondary | ICD-10-CM

## 2021-10-06 NOTE — Telephone Encounter (Signed)
Refill: buPROPion ER (WELLBUTRIN SR) 100 MG 12 hr tablet  venlafaxine XR (EFFEXOR-XR) 150 MG 24 hr capsule  Send to: [facility] [id number] - [place], [place] - [address]

## 2021-10-07 MED ORDER — VENLAFAXINE HCL ER 150 MG PO CP24: 300.0000 mg | ORAL_CAPSULE | Freq: Every day | ORAL | 0 refills | Status: DC

## 2021-10-07 MED ORDER — BUPROPION HCL ER (SR) 100 MG PO TB12: 100.0000 mg | ORAL_TABLET | Freq: Every day | ORAL | 0 refills | Status: DC

## 2021-10-07 NOTE — Telephone Encounter (Signed)
Sent a one month supply for both meds

## 2021-10-14 ENCOUNTER — Ambulatory Visit: Payer: Medicare (Managed Care) | Admitting: Cardiovascular Disease

## 2021-10-20 ENCOUNTER — Ambulatory Visit (HOSPITAL_COMMUNITY): Payer: Medicare (Managed Care) | Admitting: Psychiatry

## 2021-11-27 ENCOUNTER — Other Ambulatory Visit: Payer: Self-pay | Admitting: Psychiatry

## 2021-11-27 ENCOUNTER — Telehealth (HOSPITAL_COMMUNITY): Payer: Self-pay | Admitting: Psychiatry

## 2021-11-27 DIAGNOSIS — Z630 Problems in relationship with spouse or partner: Secondary | ICD-10-CM

## 2021-11-27 DIAGNOSIS — F411 Generalized anxiety disorder: Secondary | ICD-10-CM

## 2021-11-27 MED ORDER — VENLAFAXINE HCL ER 150 MG PO CP24: 300.0000 mg | ORAL_CAPSULE | Freq: Every day | ORAL | 0 refills | Status: DC

## 2021-11-27 MED ORDER — BUPROPION HCL ER (SR) 100 MG PO TB12: 100.0000 mg | ORAL_TABLET | Freq: Every day | ORAL | 0 refills | Status: DC

## 2021-11-27 NOTE — Telephone Encounter (Signed)
Ordered

## 2021-11-27 NOTE — Telephone Encounter (Signed)
Refill: ? ?buPROPion ER (WELLBUTRIN SR) 100 MG 12 hr tablet ? ?venlafaxine XR (EFFEXOR-XR) 150 MG 24 hr capsule ? ? ?Send To: ? ?[facility] [id number] - [place], [place] - [address] ?

## 2021-12-08 ENCOUNTER — Ambulatory Visit: Payer: Medicare (Managed Care) | Admitting: Cardiovascular Disease

## 2021-12-10 ENCOUNTER — Ambulatory Visit (HOSPITAL_COMMUNITY): Payer: Medicare (Managed Care) | Admitting: Psychiatry

## 2021-12-15 ENCOUNTER — Encounter: Payer: Self-pay | Admitting: Cardiovascular Disease

## 2021-12-17 ENCOUNTER — Ambulatory Visit (INDEPENDENT_AMBULATORY_CARE_PROVIDER_SITE_OTHER): Payer: Medicare (Managed Care) | Admitting: Psychiatry

## 2021-12-17 ENCOUNTER — Encounter (HOSPITAL_COMMUNITY): Payer: Self-pay | Admitting: Psychiatry

## 2021-12-17 DIAGNOSIS — F411 Generalized anxiety disorder: Secondary | ICD-10-CM

## 2021-12-17 DIAGNOSIS — Z630 Problems in relationship with spouse or partner: Secondary | ICD-10-CM

## 2021-12-17 DIAGNOSIS — F341 Dysthymic disorder: Secondary | ICD-10-CM

## 2021-12-17 MED ORDER — BUPROPION HCL ER (SR) 100 MG PO TB12: 100.0000 mg | ORAL_TABLET | Freq: Every day | ORAL | 1 refills | Status: DC

## 2021-12-17 MED ORDER — VENLAFAXINE HCL ER 150 MG PO CP24: 300.0000 mg | ORAL_CAPSULE | Freq: Every day | ORAL | 1 refills | Status: DC

## 2021-12-17 NOTE — Progress Notes (Signed)
Patient ID: Mallory DewCarol D Verdun, female   DOB: [birth year], 66 y.o.   MRN: [id number] ?[facility] ?Tele psych ?12/17/2021 4:36 PM ?Mallory [name]  ?MRN:  [id number] ?CC/ depression follow up  ?HPI ? ? ?Returns after 8 months, doing fair, husband is sick or terminal, she helps him out but he nags and be difficult ?She is in therapy to work on coping ? ? ?Has a dog keeps busy and outdoor helps ? ? ? ?Anxiety: related to relationship and his sickness ?Depression: fair, can have down days ? ?Modifying factors : friends ?Severity fair ?Principal Problem: GAD ?Diagnosis:   ?Patient Active Problem List  ? Diagnosis Date Noted  ? Tobacco abuse [Z72.0] 04/22/2021  ? Atypical chest pain [R07.89] 04/22/2021  ? Syncope [R55] 03/11/2017  ? Menopause [Z78.0] 12/03/2014  ? Marital conflict [Z63.0] 06/21/2014  ? GAD (generalized anxiety disorder) [F41.1]   ? GERD (gastroesophageal reflux disease) [K21.9]   ? Peri-menopause [N95.1]   ? Headache [R51.9]   ? Generalized anxiety disorder [F41.1] 03/05/2013  ? ?Total Time spent with patient: 30 minutes ? ? ?Past Medical History:  ?Past Medical History:  ?Diagnosis Date  ? Anxiety   ? GAD (generalized anxiety disorder)   ? GERD (gastroesophageal reflux disease)   ? Headache   ? Hx of cardiovascular stress test   ? ETT-Myoview (03/2014):  EF 58%, no ischemia; normal  ? Peri-menopause   ?  ?Past Surgical History:  ?Procedure Laterality Date  ? CESAREAN SECTION    ? DILITATION & CURRETTAGE/HYSTROSCOPY WITH ESSURE    ? ?Family History:  ?Family History  ?Problem Relation Age of Onset  ? Coronary artery disease Maternal Grandfather   ? Cancer Maternal Grandfather   ? Coronary artery disease Maternal Grandmother   ? Cancer Maternal Grandmother   ? Coronary artery disease Paternal Grandfather   ? Cancer Paternal Grandfather   ? Coronary artery disease Paternal Grandmother   ? Cancer Paternal Grandmother   ? Diabetes Cousin   ? Alcohol abuse Neg Hx   ? Anxiety disorder Neg Hx   ? Bipolar disorder  Neg Hx   ? Depression Neg Hx   ? Drug abuse Neg Hx   ? OCD Neg Hx   ? Schizophrenia Neg Hx   ? ADD / ADHD Neg Hx   ? ?Social History:  ?Social History  ? ?Substance and Sexual Activity  ?Alcohol Use No  ? Alcohol/week: 0.0 standard drinks  ?   ?Social History  ? ?Substance and Sexual Activity  ?Drug Use No  ?  ?Social History  ? ?Socioeconomic History  ? Marital status: Married  ?  Spouse name: Not on file  ? Number of children: Not on file  ? Years of education: Not on file  ? Highest education level: Not on file  ?Occupational History  ? Not on file  ?Tobacco Use  ? Smoking status: Every Day  ?  Packs/day: 0.50  ?  Years: 39.00  ?  Pack years: 19.50  ?  Types: Cigarettes  ? Smokeless tobacco: Never  ?Vaping Use  ? Vaping Use: Never used  ?Substance and Sexual Activity  ? Alcohol use: No  ?  Alcohol/week: 0.0 standard drinks  ? Drug use: No  ? Sexual activity: Never  ?  Partners: Male  ?Other Topics Concern  ? Not on file  ?Social History Narrative  ? Not on file  ? ?Social Determinants of Health  ? ?Financial Resource Strain: Not on file  ?  Food Insecurity: Not on file  ?Transportation Needs: Not on file  ?Physical Activity: Not on file  ?Stress: Not on file  ?Social Connections: Not on file  ? ? ? ?Psychiatric Specialty Exam: ?Physical Exam  ?Review of Systems  ?Cardiovascular:  Negative for chest pain and palpitations.  ?Neurological:  Negative for tremors.  ?Psychiatric/Behavioral:  Negative for suicidal ideas.    ?There is no height or weight on file to calculate BMI.  ?General Appearance:casual  ?Eye Contact:: fair  ?Speech:  Clear and Coherent  ?Volume:  Normal  ?Mood: fair  ?Affect:  congruent  ?Thought Process:  Coherent and Goal Directed  ?Orientation:  Full (Time, Place, and Person)  ?Thought Content:  WDL  ?Suicidal Thoughts:  No  ?Homicidal Thoughts:  No  ?Memory:  NA  ?Judgement:  Intact  ?Insight:  Good and Present  ?Psychomotor Activity:  Normal  ?Concentration:  Good  ?Recall:  Good  ?Fund of  Knowledge:Good  ?Language: Good  ?Akathisia:  No  ?Handed:  Right  ?AIMS (if indicated):     ?Assets:  Communication Skills ?Desire for Improvement ?Financial Resources/Insurance ?Housing ?Leisure Time ?Physical Health ?Resilience ?Social Support ?Talents/Skills ?Transportation  ?ADL's:  Intact  ?Cognition: WNL  ?Sleep:     ? ? ? ?Current Medications: ?Current Outpatient Medications  ?Medication Sig Dispense Refill  ? amitriptyline (ELAVIL) 25 MG tablet Take 25 mg by mouth at bedtime. Patient takes 1 tablet twice daily    ? buPROPion ER (WELLBUTRIN SR) 100 MG 12 hr tablet Take 1 tablet (100 mg total) by mouth daily. 30 tablet 1  ? cyclobenzaprine (FLEXERIL) 10 MG tablet Reported on 10/07/2015 (Patient not taking: Reported on 04/22/2021)  0  ? estradiol (CLIMARA - DOSED IN MG/24 HR) 0.075 mg/24hr patch Place onto the skin.    ? estradiol (VIVELLE-DOT) 0.075 MG/24HR APPLY 1 PATCH TWICE A WEEK (Patient not taking: No sig reported)    ? famotidine (PEPCID) 20 MG tablet Take 20 mg by mouth in the morning and at bedtime.    ? fexofenadine (ALLEGRA) 180 MG tablet Take 180 mg by mouth daily. (Patient not taking: Reported on 04/22/2021)    ? fluticasone (FLONASE) 50 MCG/ACT nasal spray Place 2 sprays into the nose.    ? hydrOXYzine (ATARAX/VISTARIL) 10 MG tablet Take 1 tablet (10 mg total) by mouth 2 (two) times daily as needed for anxiety. (Patient not taking: Reported on 04/22/2021) 45 tablet 0  ? metoprolol tartrate (LOPRESSOR) 100 MG tablet Take 1 tablet by mouth once for procedure. 1 tablet 0  ? MINIVELLE 0.05 MG/24HR Place 1 patch onto the skin daily. Reported on 10/07/2015 (Patient not taking: No sig reported)    ? progesterone (PROMETRIUM) 100 MG capsule Take 100 mg by mouth daily. (Patient not taking: No sig reported)  1  ? progesterone (PROMETRIUM) 100 MG capsule Take 100 mg by mouth at bedtime.    ? venlafaxine XR (EFFEXOR-XR) 150 MG 24 hr capsule Take 2 capsules (300 mg total) by mouth daily with breakfast. 60 capsule  1  ? ?No current facility-administered medications for this visit.  ? ? ?Lab Results: No results found for this or any previous visit (from the past 48 hour(s)). ? ?Physical Findings: ?AIMS:  ?CIWA:   ?COWS:   ? ?Treatment Plan Summary: ?Medication management ? ?Prior documentation reviewed ? ?VCB:SWHQP fair, working on self esteem continue wellbutrin, effexor ? ?GAD:  ?Managing it continue meds ? Relationship difficulty": understands he is sick but he can be nagging,  discussed to distract from negative thougs, continue effexor for anxiety and therapy to cope ? ? ?Meds renewed ?Direct care time including face to face 20 min, includes chart review and documentation ? ?Fu 30m.  ?4:36 PM ?12/17/2021 ? ?

## 2022-02-03 ENCOUNTER — Ambulatory Visit: Payer: Medicare (Managed Care) | Admitting: Cardiovascular Disease

## 2022-02-09 ENCOUNTER — Encounter: Payer: Self-pay | Admitting: Cardiovascular Disease

## 2022-04-08 ENCOUNTER — Ambulatory Visit (HOSPITAL_COMMUNITY): Payer: Medicare (Managed Care) | Admitting: Psychiatry

## 2022-04-29 ENCOUNTER — Encounter (HOSPITAL_COMMUNITY): Payer: Self-pay | Admitting: Psychiatry

## 2022-04-29 ENCOUNTER — Ambulatory Visit (INDEPENDENT_AMBULATORY_CARE_PROVIDER_SITE_OTHER): Payer: Medicare (Managed Care) | Admitting: Psychiatry

## 2022-04-29 VITALS — BP 126/68 | HR 75 | Temp 98.6°F | Ht 62.5 in | Wt 157.0 lb

## 2022-04-29 DIAGNOSIS — F411 Generalized anxiety disorder: Secondary | ICD-10-CM | POA: Diagnosis not present

## 2022-04-29 DIAGNOSIS — F341 Dysthymic disorder: Secondary | ICD-10-CM

## 2022-04-29 DIAGNOSIS — Z630 Problems in relationship with spouse or partner: Secondary | ICD-10-CM

## 2022-04-29 MED ORDER — BUPROPION HCL ER (SR) 100 MG PO TB12: 100.0000 mg | ORAL_TABLET | Freq: Every day | ORAL | 2 refills | Status: DC

## 2022-04-29 MED ORDER — VENLAFAXINE HCL ER 150 MG PO CP24: 300.0000 mg | ORAL_CAPSULE | Freq: Every day | ORAL | 2 refills | Status: DC

## 2022-04-29 NOTE — Progress Notes (Signed)
Patient ID: Mallory [name], female   DOB: 1955/10/28, 66 y.o.   MRN: [id number] [facility] MD Progress Note Tele psych 04/29/2022 10:49 AM Cleon Dew  MRN:  [id number] CC/ depression follow up  HPI   Married , husband is terminally sick, he used to nag her a lot in past.  She is care giving him but trying to get ME time to balance it Mom died , going thru grief but handling it with support of her brother Toelrating meds   Aggravating factor: husand sick  Anxiety: related to relationship and his sickness Depression: manageable  Modifying factors : friends Duration adult life Severity: gets subdued but handling it  Principal Problem: GAD Diagnosis:   Patient Active Problem List   Diagnosis Date Noted   Tobacco abuse [Z72.0] 04/22/2021   Atypical chest pain [R07.89] 04/22/2021   Syncope [R55] 03/11/2017   Menopause [Z78.0] 12/03/2014   Marital conflict [Z63.0] 06/21/2014   GAD (generalized anxiety disorder) [F41.1]    GERD (gastroesophageal reflux disease) [K21.9]    Peri-menopause [N95.1]    Headache [R51.9]    Generalized anxiety disorder [F41.1] 03/05/2013   Total Time spent with patient: 30 minutes   Past Medical History:  Past Medical History:  Diagnosis Date   Anxiety    GAD (generalized anxiety disorder)    GERD (gastroesophageal reflux disease)    Headache    Hx of cardiovascular stress test    ETT-Myoview (03/2014):  EF 58%, no ischemia; normal   Peri-menopause     Past Surgical History:  Procedure Laterality Date   CESAREAN SECTION     DILITATION & CURRETTAGE/HYSTROSCOPY WITH ESSURE     Family History:  Family History  Problem Relation Age of Onset   Coronary artery disease Maternal Grandfather    Cancer Maternal Grandfather    Coronary artery disease Maternal Grandmother    Cancer Maternal Grandmother    Coronary artery disease Paternal Grandfather    Cancer Paternal Grandfather    Coronary artery disease Paternal Grandmother    Cancer Paternal  Grandmother    Diabetes Cousin    Alcohol abuse Neg Hx    Anxiety disorder Neg Hx    Bipolar disorder Neg Hx    Depression Neg Hx    Drug abuse Neg Hx    OCD Neg Hx    Schizophrenia Neg Hx    ADD / ADHD Neg Hx    Social History:  Social History   Substance and Sexual Activity  Alcohol Use No   Alcohol/week: 0.0 standard drinks of alcohol     Social History   Substance and Sexual Activity  Drug Use No    Social History   Socioeconomic History   Marital status: Married    Spouse name: Not on file   Number of children: Not on file   Years of education: Not on file   Highest education level: Not on file  Occupational History   Not on file  Tobacco Use   Smoking status: Every Day    Packs/day: 0.50    Years: 39.00    Total pack years: 19.50    Types: Cigarettes   Smokeless tobacco: Never  Vaping Use   Vaping Use: Never used  Substance and Sexual Activity   Alcohol use: No    Alcohol/week: 0.0 standard drinks of alcohol   Drug use: No   Sexual activity: Never    Partners: Male  Other Topics Concern   Not on file  Social History Narrative  Not on file   Social Determinants of Health   Financial Resource Strain: Not on file  Food Insecurity: Not on file  Transportation Needs: Not on file  Physical Activity: Not on file  Stress: Not on file  Social Connections: Not on file     Psychiatric Specialty Exam: Physical Exam  Review of Systems  Cardiovascular:  Negative for chest pain and palpitations.  Neurological:  Negative for tremors.  Psychiatric/Behavioral:  Negative for suicidal ideas.     There is no height or weight on file to calculate BMI.  General Appearance:casual  Eye Contact:: fair  Speech:  Clear and Coherent  Volume:  Normal  Mood: fair somewhat stress  Affect:  congruent  Thought Process:  Coherent and Goal Directed  Orientation:  Full (Time, Place, and Person)  Thought Content:  WDL  Suicidal Thoughts:  No  Homicidal Thoughts:  No   Memory:  NA  Judgement:  Intact  Insight:  Good and Present  Psychomotor Activity:  Normal  Concentration:  Good  Recall:  Good  Fund of Knowledge:Good  Language: Good  Akathisia:  No  Handed:  Right  AIMS (if indicated):     Assets:  Communication Skills Desire for Improvement Financial Resources/Insurance Housing Leisure Time Physical Health Resilience Social Support Talents/Skills Transportation  ADL's:  Intact  Cognition: WNL  Sleep:        Current Medications: Current Outpatient Medications  Medication Sig Dispense Refill   amitriptyline (ELAVIL) 25 MG tablet Take 25 mg by mouth at bedtime. Patient takes 1 tablet twice daily     buPROPion ER (WELLBUTRIN SR) 100 MG 12 hr tablet Take 1 tablet (100 mg total) by mouth daily. 30 tablet 1   cyclobenzaprine (FLEXERIL) 10 MG tablet Reported on 10/07/2015 (Patient not taking: Reported on 04/22/2021)  0   estradiol (CLIMARA - DOSED IN MG/24 HR) 0.075 mg/24hr patch Place onto the skin.     estradiol (VIVELLE-DOT) 0.075 MG/24HR APPLY 1 PATCH TWICE A WEEK (Patient not taking: No sig reported)     famotidine (PEPCID) 20 MG tablet Take 20 mg by mouth in the morning and at bedtime.     fexofenadine (ALLEGRA) 180 MG tablet Take 180 mg by mouth daily. (Patient not taking: Reported on 04/22/2021)     fluticasone (FLONASE) 50 MCG/ACT nasal spray Place 2 sprays into the nose.     hydrOXYzine (ATARAX/VISTARIL) 10 MG tablet Take 1 tablet (10 mg total) by mouth 2 (two) times daily as needed for anxiety. (Patient not taking: Reported on 04/22/2021) 45 tablet 0   metoprolol tartrate (LOPRESSOR) 100 MG tablet Take 1 tablet by mouth once for procedure. 1 tablet 0   MINIVELLE 0.05 MG/24HR Place 1 patch onto the skin daily. Reported on 10/07/2015 (Patient not taking: No sig reported)     progesterone (PROMETRIUM) 100 MG capsule Take 100 mg by mouth daily. (Patient not taking: No sig reported)  1   progesterone (PROMETRIUM) 100 MG capsule Take 100 mg by  mouth at bedtime.     venlafaxine XR (EFFEXOR-XR) 150 MG 24 hr capsule Take 2 capsules (300 mg total) by mouth daily with breakfast. 60 capsule 1   No current facility-administered medications for this visit.    Lab Results: No results found for this or any previous visit (from the past 48 hour(s)).  Physical Findings: AIMS:  CIWA:   COWS:    Treatment Plan Summary: Medication management  Prior documentation reviewed  IRJ:JOAC, going thru grief but meds help,  will continue effeoxr and wellbutrin  GAD:  Fluctuates, continue effexor and add me time  Relationship stress; gives care giving to husband but tries to have ME timie that helps Going thru giref, provided supportive therapy and has brothers suppot   Meds renewed Direct care time including face to face 20 min including documentation , chart review  Fu 39m.  10:49 AM 04/29/2022

## 2022-05-19 ENCOUNTER — Ambulatory Visit: Payer: Medicare (Managed Care) | Attending: Cardiovascular Disease | Admitting: Cardiovascular Disease

## 2022-07-05 ENCOUNTER — Encounter: Payer: Self-pay | Admitting: Cardiovascular Disease

## 2022-07-05 ENCOUNTER — Ambulatory Visit: Payer: Medicare (Managed Care) | Attending: Cardiovascular Disease | Admitting: Cardiovascular Disease

## 2022-07-05 DIAGNOSIS — Z720 Tobacco use: Secondary | ICD-10-CM

## 2022-07-05 DIAGNOSIS — R55 Syncope and collapse: Secondary | ICD-10-CM | POA: Diagnosis not present

## 2022-07-05 DIAGNOSIS — R0789 Other chest pain: Secondary | ICD-10-CM

## 2022-07-05 LAB — LIPID PANEL
Chol/HDL Ratio: 3.1 ratio (ref 0.0–4.4)
Cholesterol, Total: 199 mg/dL (ref 100–199)
HDL: 65 mg/dL (ref >39)
LDL Chol Calc (NIH): 124 mg/dL — ABNORMAL HIGH (ref 0–99)
Triglycerides: 52 mg/dL (ref 0–149)
VLDL Cholesterol Cal: 10 mg/dL (ref 5–40)

## 2022-07-05 LAB — HEPATIC FUNCTION PANEL
ALT: 7 IU/L (ref 0–32)
AST: 12 IU/L (ref 0–40)
Albumin: 4.2 g/dL (ref 3.9–4.9)
Alkaline Phosphatase: 105 IU/L (ref 44–121)
Bilirubin Total: <0.2 mg/dL (ref 0.0–1.2)
Bilirubin, Direct: <0.1 mg/dL (ref 0.00–0.40)
Total Protein: 6.3 g/dL (ref 6.0–8.5)

## 2022-07-05 NOTE — Assessment & Plan Note (Signed)
No further episodes of syncope since her original episodes at a funeral.  Work-up was unrevealing.

## 2022-07-05 NOTE — Patient Instructions (Signed)
Medication Instructions:  Your physician recommends that you continue on your current medications as directed. Please refer to the Current Medication list given to you today.  *If you need a refill on your cardiac medications before your next appointment, please call your pharmacy*   Lab Work: Your physician recommends that you have labs drawn today: Lipid/liver panel  If you have labs (blood work) drawn today and your tests are completely normal, you will receive your results only by: MyChart Message (if you have MyChart) OR A paper copy in the mail If you have any lab test that is abnormal or we need to change your treatment, we will call you to review the results.   Follow-Up: At [facility], you and your health needs are our priority.  As part of our continuing mission to provide you with exceptional heart care, we have created designated Provider Care Teams.  These Care Teams include your primary Cardiologist (physician) and Advanced Practice Providers (APPs -  Physician Assistants and Nurse Practitioners) who all work together to provide you with the care you need, when you need it.  We recommend signing up for the patient portal called "MyChart".  Sign up information is provided on this After Visit Summary.  MyChart is used to connect with patients for Virtual Visits (Telemedicine).  Patients are able to view lab/test results, encounter notes, upcoming appointments, etc.  Non-urgent messages can be sent to your provider as well.   To learn more about what you can do with MyChart, go to [email or web address].    Your next appointment:   12 month(s)  The format for your next appointment:   In Person  Provider:   Jonathan Berry, MD    

## 2022-07-05 NOTE — Assessment & Plan Note (Signed)
Atypical chest pain in the past which she no longer has.  She did have a coronary calcium score of 9 performed 05/04/2021 with a CTA FFR that was negative for physiologically significant stenosis in the LAD.

## 2022-07-05 NOTE — Assessment & Plan Note (Signed)
Ongoing tobacco abuse of a half a pack a day recalcitrant to respect modification.

## 2022-07-05 NOTE — Progress Notes (Signed)
07/05/2022 TEKESHA [name]   December 13, 1955  [id number]  Primary Physician Glean Salvo, PA Primary Cardiologist: Lorretta Harp MD Lupe Carney, [place]  HPI:  Mallory [name] is a 66 y.o.  thin-appearing married Caucasian female mother of one child who works as a Engineer, materials homes for a living.  Unfortunately, her husband is chronically ill with cancer and other chronic illnesses.  She was referred by her PCP for cardiovascular evaluation because of 2 episodes of syncope while attending a funeral .I last saw her in the office 04/22/2021.  Her risk factors include 20-pack-years of tobacco abuse currently smoking one half pack per day. Otherwise, she has no cardiac risk factors. She's never had a heart attack or stroke and denies chest pain or shortness of breath. She was at a funeral and had 2 episodes of syncope in fairly rapid succession. He was hot out although she said she was hydrated. There is no nausea or other vagal associated symptoms. She had a 2-D echo which was essentially normal and an event monitor that was unrevealing.  I did get a 2D echo which was essentially normal as was an event monitor.  She has had no further episodes of syncope.   Since I saw her a year ago she is remained stable.  She still dealing with her chronically ill husband at home.  She denies chest pain or shortness of breath.  She said no further episodes of syncope.  She did have a coronary CTA performed 05/04/2021 that revealed a coronary calcium score of 9 with mild plaque in the proximal LAD with a negative FFR.   Current Meds  Medication Sig   buPROPion ER (WELLBUTRIN SR) 100 MG 12 hr tablet Take 1 tablet (100 mg total) by mouth daily.   estradiol (CLIMARA - DOSED IN MG/24 HR) 0.075 mg/24hr patch Place onto the skin.   estradiol (VIVELLE-DOT) 0.075 MG/24HR APPLY 1 PATCH TWICE A WEEK   famotidine (PEPCID) 20 MG tablet Take 20 mg by mouth in the morning and at bedtime.   fexofenadine  (ALLEGRA) 180 MG tablet Take 180 mg by mouth daily.   fluticasone (FLONASE) 50 MCG/ACT nasal spray Place 2 sprays into the nose.   hydrOXYzine (ATARAX/VISTARIL) 10 MG tablet Take 1 tablet (10 mg total) by mouth 2 (two) times daily as needed for anxiety.   omeprazole (PRILOSEC) 20 MG capsule Take 20 mg by mouth in the morning.   progesterone (PROMETRIUM) 100 MG capsule Take 100 mg by mouth daily.   progesterone (PROMETRIUM) 100 MG capsule Take 100 mg by mouth at bedtime.   venlafaxine XR (EFFEXOR-XR) 150 MG 24 hr capsule Take 2 capsules (300 mg total) by mouth daily with breakfast.     Allergies  Allergen Reactions   Banana Itching   Latex     Social History   Socioeconomic History   Marital status: Married    Spouse name: Not on file   Number of children: Not on file   Years of education: Not on file   Highest education level: Not on file  Occupational History   Not on file  Tobacco Use   Smoking status: Every Day    Packs/day: 0.50    Years: 39.00    Total pack years: 19.50    Types: Cigarettes   Smokeless tobacco: Never  Vaping Use   Vaping Use: Never used  Substance and Sexual Activity   Alcohol use: No    Alcohol/week: 0.0  standard drinks of alcohol   Drug use: No   Sexual activity: Never    Partners: Male  Other Topics Concern   Not on file  Social History Narrative   Not on file   Social Determinants of Health   Financial Resource Strain: Not on file  Food Insecurity: Not on file  Transportation Needs: Not on file  Physical Activity: Not on file  Stress: Not on file  Social Connections: Not on file  Intimate Partner Violence: Not on file     Review of Systems: General: negative for chills, fever, night sweats or weight changes.  Cardiovascular: negative for chest pain, dyspnea on exertion, edema, orthopnea, palpitations, paroxysmal nocturnal dyspnea or shortness of breath Dermatological: negative for rash Respiratory: negative for cough or  wheezing Urologic: negative for hematuria Abdominal: negative for nausea, vomiting, diarrhea, bright red blood per rectum, melena, or hematemesis Neurologic: negative for visual changes, syncope, or dizziness All other systems reviewed and are otherwise negative except as noted above.    Blood pressure (!) 150/80, pulse 64, height 5' 2.5" (1.588 m), weight 152 lb 6.4 oz (69.1 kg), last menstrual period 11/11/2012, SpO2 98 %.  General appearance: alert and no distress Neck: no adenopathy, no carotid bruit, no JVD, supple, symmetrical, trachea midline, and thyroid not enlarged, symmetric, no tenderness/mass/nodules Lungs: clear to auscultation bilaterally Heart: regular rate and rhythm, S1, S2 normal, no murmur, click, rub or gallop Extremities: extremities normal, atraumatic, no cyanosis or edema Pulses: 2+ and symmetric Skin: Skin color, texture, turgor normal. No rashes or lesions Neurologic: Grossly normal  EKG normal sinus rhythm at 64 without ST or T wave changes.  Personally reviewed this EKG.  ASSESSMENT AND PLAN:   Syncope No further episodes of syncope since her original episodes at a funeral.  Work-up was unrevealing.  Tobacco abuse Ongoing tobacco abuse of a half a pack a day recalcitrant to respect modification.  Atypical chest pain Atypical chest pain in the past which she no longer has.  She did have a coronary calcium score of 9 performed 05/04/2021 with a CTA FFR that was negative for physiologically significant stenosis in the LAD.     Runell Gess MD FACP,FACC,FAHA, [facility] 07/05/2022 10:27 AM

## 2022-07-29 ENCOUNTER — Ambulatory Visit (INDEPENDENT_AMBULATORY_CARE_PROVIDER_SITE_OTHER): Payer: Medicare (Managed Care) | Admitting: Psychiatry

## 2022-07-29 ENCOUNTER — Telehealth (HOSPITAL_COMMUNITY): Payer: Self-pay | Admitting: *Deleted

## 2022-07-29 ENCOUNTER — Encounter (HOSPITAL_COMMUNITY): Payer: Self-pay | Admitting: Psychiatry

## 2022-07-29 VITALS — BP 145/86 | HR 72 | Ht 63.0 in | Wt 153.0 lb

## 2022-07-29 DIAGNOSIS — Z630 Problems in relationship with spouse or partner: Secondary | ICD-10-CM | POA: Diagnosis not present

## 2022-07-29 DIAGNOSIS — F411 Generalized anxiety disorder: Secondary | ICD-10-CM | POA: Diagnosis not present

## 2022-07-29 DIAGNOSIS — F341 Dysthymic disorder: Secondary | ICD-10-CM | POA: Diagnosis not present

## 2022-07-29 MED ORDER — BUPROPION HCL ER (SR) 100 MG PO TB12: 100.0000 mg | ORAL_TABLET | Freq: Every day | ORAL | 2 refills | Status: DC

## 2022-07-29 MED ORDER — VENLAFAXINE HCL ER 150 MG PO CP24: 300.0000 mg | ORAL_CAPSULE | Freq: Every day | ORAL | 2 refills | Status: DC

## 2022-07-29 MED ORDER — HYDROXYZINE HCL 10 MG PO TABS: 10.0000 mg | ORAL_TABLET | Freq: Two times a day (BID) | ORAL | 0 refills | Status: DC | PRN

## 2022-07-29 NOTE — Telephone Encounter (Signed)
hydrOXYzine (ATARAX) 10 MG tablet  P.A. Denied for Script Written as:        Take 1 tablet (10 mg total) by mouth 2 (two) times  daily as needed for anxiety.,   Dispense: 45 tablet   VERBAL for Dispense :60 ACCEPTED & APPROVED  CASE ID# [id number] EFFECTIVE: 06/29/22  THRU 07/29/23

## 2022-07-29 NOTE — Progress Notes (Signed)
Patient ID: Mallory [name], female   DOB: 1956-01-06, 66 y.o.   MRN: [id number] [facility] MD Progress Note Tele psych 07/29/2022 10:48 AM Cleon Dew  MRN:  [id number] CC/ depression follow up  HPI  Married , husband is terminally sick, Stil gets worried when he puts her down and feel subdued due to that Discussed how to distract from negative judjement Overall meds helping keep some balance  Aggravating factor: husand sick  Anxiety: related to relationship and his sickness Depression: manageable   Modifying factors : friends, gardening  Duration adult life Severity: gets subdued but handling it  Principal Problem: GAD Diagnosis:   Patient Active Problem List   Diagnosis Date Noted   Tobacco abuse [Z72.0] 04/22/2021   Atypical chest pain [R07.89] 04/22/2021   Syncope [R55] 03/11/2017   Menopause [Z78.0] 12/03/2014   Marital conflict [Z63.0] 06/21/2014   GAD (generalized anxiety disorder) [F41.1]    GERD (gastroesophageal reflux disease) [K21.9]    Peri-menopause [N95.1]    Headache [R51.9]    Generalized anxiety disorder [F41.1] 03/05/2013   Total Time spent with patient: 30 minutes   Past Medical History:  Past Medical History:  Diagnosis Date   Anxiety    GAD (generalized anxiety disorder)    GERD (gastroesophageal reflux disease)    Headache    Hx of cardiovascular stress test    ETT-Myoview (03/2014):  EF 58%, no ischemia; normal   Peri-menopause     Past Surgical History:  Procedure Laterality Date   CESAREAN SECTION     DILITATION & CURRETTAGE/HYSTROSCOPY WITH ESSURE     Family History:  Family History  Problem Relation Age of Onset   Coronary artery disease Maternal Grandfather    Cancer Maternal Grandfather    Coronary artery disease Maternal Grandmother    Cancer Maternal Grandmother    Coronary artery disease Paternal Grandfather    Cancer Paternal Grandfather    Coronary artery disease Paternal Grandmother    Cancer Paternal Grandmother     Diabetes Cousin    Alcohol abuse Neg Hx    Anxiety disorder Neg Hx    Bipolar disorder Neg Hx    Depression Neg Hx    Drug abuse Neg Hx    OCD Neg Hx    Schizophrenia Neg Hx    ADD / ADHD Neg Hx    Social History:  Social History   Substance and Sexual Activity  Alcohol Use No   Alcohol/week: 0.0 standard drinks of alcohol     Social History   Substance and Sexual Activity  Drug Use No    Social History   Socioeconomic History   Marital status: Married    Spouse name: Not on file   Number of children: Not on file   Years of education: Not on file   Highest education level: Not on file  Occupational History   Not on file  Tobacco Use   Smoking status: Every Day    Packs/day: 0.50    Years: 39.00    Total pack years: 19.50    Types: Cigarettes   Smokeless tobacco: Never  Vaping Use   Vaping Use: Never used  Substance and Sexual Activity   Alcohol use: No    Alcohol/week: 0.0 standard drinks of alcohol   Drug use: No   Sexual activity: Never    Partners: Male  Other Topics Concern   Not on file  Social History Narrative   Not on file   Social Determinants of Health  Financial Resource Strain: Not on file  Food Insecurity: Not on file  Transportation Needs: Not on file  Physical Activity: Not on file  Stress: Not on file  Social Connections: Not on file     Psychiatric Specialty Exam: Physical Exam  Review of Systems  Cardiovascular:  Negative for chest pain and palpitations.  Neurological:  Negative for tremors.  Psychiatric/Behavioral:  Negative for suicidal ideas.     Body mass index is 27.1 kg/m.  General Appearance:casual  Eye Contact:: fair  Speech:  Clear and Coherent  Volume:  Normal  Mood: fair  Affect:  congruent  Thought Process:  Coherent and Goal Directed  Orientation:  Full (Time, Place, and Person)  Thought Content:  WDL  Suicidal Thoughts:  No  Homicidal Thoughts:  No  Memory:  NA  Judgement:  Intact  Insight:  Good and  Present  Psychomotor Activity:  Normal  Concentration:  Good  Recall:  Good  Fund of Knowledge:Good  Language: Good  Akathisia:  No  Handed:  Right  AIMS (if indicated):     Assets:  Communication Skills Desire for Improvement Financial Resources/Insurance Housing Leisure Time Physical Health Resilience Social Support Talents/Skills Transportation  ADL's:  Intact  Cognition: WNL  Sleep:        Current Medications: Current Outpatient Medications  Medication Sig Dispense Refill   buPROPion ER (WELLBUTRIN SR) 100 MG 12 hr tablet Take 1 tablet (100 mg total) by mouth daily. 30 tablet 2   diclofenac (VOLTAREN) 75 MG EC tablet Take 75 mg by mouth 2 (two) times daily.     estradiol (CLIMARA - DOSED IN MG/24 HR) 0.075 mg/24hr patch Place onto the skin.     estradiol (VIVELLE-DOT) 0.075 MG/24HR APPLY 1 PATCH TWICE A WEEK     famotidine (PEPCID) 20 MG tablet Take 20 mg by mouth in the morning and at bedtime.     fexofenadine (ALLEGRA) 180 MG tablet Take 180 mg by mouth daily.     fluticasone (FLONASE) 50 MCG/ACT nasal spray Place 2 sprays into the nose.     hydrOXYzine (ATARAX) 10 MG tablet Take 1 tablet (10 mg total) by mouth 2 (two) times daily as needed for anxiety. 45 tablet 0   omeprazole (PRILOSEC) 20 MG capsule Take 20 mg by mouth in the morning.     progesterone (PROMETRIUM) 100 MG capsule Take 100 mg by mouth daily.  1   progesterone (PROMETRIUM) 100 MG capsule Take 100 mg by mouth at bedtime.     venlafaxine XR (EFFEXOR-XR) 150 MG 24 hr capsule Take 2 capsules (300 mg total) by mouth daily with breakfast. 60 capsule 2   No current facility-administered medications for this visit.    Lab Results: No results found for this or any previous visit (from the past 48 hour(s)).  Physical Findings: AIMS:  CIWA:   COWS:    Treatment Plan Summary: Medication management  Prior documentation reviewed  MDD: fair but can get upset continue to work on distractions, continue  meds inclding wellbutrin, effexor  GAD:  Varies, discussed positvie thinking continue effexor   Relationship stress;  discussed to work on self and distract from negative comments, work in therapy continue effexor Going thru Psychologist, clinical, provided supportive therapy and has brothers suppot   Meds renewed Direct care time including face to face 91m ininutes including documentation , chart review  Fu 100m.  10:48 AM 07/29/2022

## 2022-08-10 ENCOUNTER — Telehealth: Payer: Self-pay

## 2022-08-10 NOTE — Telephone Encounter (Signed)
Left message for pt to call back  °

## 2022-08-10 NOTE — Telephone Encounter (Signed)
-----   Message from Runell Gess, MD sent at 07/06/2022  6:23 AM EDT ----- LDL 124 not on statin Rx. With plaque in prox LAD , I would like LDL<70. Start Atorva 20 mg and recheck FLP 3 months

## 2022-10-01 ENCOUNTER — Telehealth: Payer: Self-pay | Admitting: Cardiovascular Disease

## 2022-10-01 DIAGNOSIS — E7800 Pure hypercholesterolemia, unspecified: Secondary | ICD-10-CM

## 2022-10-01 NOTE — Telephone Encounter (Signed)
Attempted to contact patient, LVM to call back.   

## 2022-10-01 NOTE — Telephone Encounter (Signed)
Patient states she is returning call. Please advise

## 2022-10-07 MED ORDER — ATORVASTATIN CALCIUM 20 MG PO TABS
20.0000 mg | ORAL_TABLET | Freq: Every day | ORAL | 3 refills | Status: DC
Start: 2022-10-07 — End: 2023-07-27

## 2022-10-07 NOTE — Telephone Encounter (Signed)
Left message for pt to call.

## 2022-10-07 NOTE — Telephone Encounter (Signed)
Spoke with pt, aware of results. New script sent to the pharmacy Lab orders mailed to the pt  

## 2022-12-11 LAB — LIPID PANEL
Chol/HDL Ratio: 2.4 ratio (ref 0.0–4.4)
Cholesterol, Total: 156 mg/dL (ref 100–199)
HDL: 65 mg/dL (ref >39)
LDL Chol Calc (NIH): 81 mg/dL (ref 0–99)
Triglycerides: 49 mg/dL (ref 0–149)
VLDL Cholesterol Cal: 10 mg/dL (ref 5–40)

## 2022-12-11 LAB — HEPATIC FUNCTION PANEL
ALT: 8 IU/L (ref 0–32)
AST: 12 IU/L (ref 0–40)
Albumin: 4.1 g/dL (ref 3.9–4.9)
Alkaline Phosphatase: 101 IU/L (ref 44–121)
Bilirubin Total: 0.2 mg/dL (ref 0.0–1.2)
Bilirubin, Direct: <0.1 mg/dL (ref 0.00–0.40)
Total Protein: 6.2 g/dL (ref 6.0–8.5)

## 2022-12-21 ENCOUNTER — Telehealth: Payer: Self-pay | Admitting: Cardiovascular Disease

## 2022-12-21 NOTE — Telephone Encounter (Signed)
Mallory Gess, MD  You46 minutes ago (11:35 AM)    Patient is not on oral anticoagulant.  Apparently the venous Doppler was negative for DVT.  She can follow-up with her PCP for further evaluation.   Spoke with pt with Dr. Hazle Coca recommendations. Pt verbalizes understanding.

## 2022-12-21 NOTE — Telephone Encounter (Signed)
Spoke with pt regarding bruising that she has on her left calf. She states that it is blue and purple colored and seems to be getting bigger. It is now moving around to the inner part of her calf. Pt is concerned that the bruise looks worst and is not improving. Pt did go to [facility] ED on Sunday. Venous Ultrasound of the area was done with no evidence of DVT. Pt does state that left leg is slightly swollen and they is a small knot that hurts when touched. Pt did mention that at ED visit her potassium came back low (3.5mmol/L) and was given of potassium to replete this. Pt was told to follow up with her PCP and she has called, she is waiting for a call back on when they can get her in. Pt is concerned and would like for Dr. Allyson Sabal to weigh in. Will send to provider to advise.

## 2022-12-21 NOTE — Telephone Encounter (Signed)
Patient stated she went to the ER on 4/7 for bruising in her left calf.  Patient is concerned that the bruising has gotten worse.  Patient has visit scheduled on 4/18.

## 2022-12-30 ENCOUNTER — Ambulatory Visit: Payer: Medicare (Managed Care) | Attending: Nurse Practitioner | Admitting: Nurse Practitioner

## 2022-12-30 ENCOUNTER — Encounter: Payer: Self-pay | Admitting: Nurse Practitioner

## 2022-12-30 VITALS — BP 154/89 | HR 82 | Ht 62.5 in | Wt 147.4 lb

## 2022-12-30 DIAGNOSIS — S8012XD Contusion of left lower leg, subsequent encounter: Secondary | ICD-10-CM

## 2022-12-30 DIAGNOSIS — I2510 Atherosclerotic heart disease of native coronary artery without angina pectoris: Secondary | ICD-10-CM

## 2022-12-30 DIAGNOSIS — R0789 Other chest pain: Secondary | ICD-10-CM

## 2022-12-30 DIAGNOSIS — Z87898 Personal history of other specified conditions: Secondary | ICD-10-CM | POA: Diagnosis not present

## 2022-12-30 DIAGNOSIS — M79604 Pain in right leg: Secondary | ICD-10-CM

## 2022-12-30 DIAGNOSIS — Z720 Tobacco use: Secondary | ICD-10-CM | POA: Diagnosis not present

## 2022-12-30 DIAGNOSIS — M79605 Pain in left leg: Secondary | ICD-10-CM

## 2022-12-30 NOTE — Patient Instructions (Signed)
Medication Instructions:  Your physician recommends that you continue on your current medications as directed. Please refer to the Current Medication list given to you today.   *If you need a refill on your cardiac medications before your next appointment, please call your pharmacy*   Lab Work: NONE ordered at this time of appointment   If you have labs (blood work) drawn today and your tests are completely normal, you will receive your results only by: MyChart Message (if you have MyChart) OR A paper copy in the mail If you have any lab test that is abnormal or we need to change your treatment, we will call you to review the results.   Testing/Procedures: NONE ordered at this time of appointment     Follow-Up: At [facility], you and your health needs are our priority.  As part of our continuing mission to provide you with exceptional heart care, we have created designated Provider Care Teams.  These Care Teams include your primary Cardiologist (physician) and Advanced Practice Providers (APPs -  Physician Assistants and Nurse Practitioners) who all work together to provide you with the care you need, when you need it.  We recommend signing up for the patient portal called "MyChart".  Sign up information is provided on this After Visit Summary.  MyChart is used to connect with patients for Virtual Visits (Telemedicine).  Patients are able to view lab/test results, encounter notes, upcoming appointments, etc.  Non-urgent messages can be sent to your provider as well.   To learn more about what you can do with MyChart, go to [email or web address].    Your next appointment:   6 month(s)  Provider:   Jonathan Berry, MD     Other Instructions   

## 2022-12-30 NOTE — Progress Notes (Signed)
Office Visit    Patient Name: Mallory [name] Date of Encounter: 12/30/2022  Primary Care Provider:  Dow Adolph, PA Primary Cardiologist:  Nanetta Batty, MD  Chief Complaint    67 year old female with a history of mild nonobstructive CAD, atypical chest pain, syncope, tobacco use, anxiety, and GERD who presents for follow-up related to CAD.  Past Medical History    Past Medical History:  Diagnosis Date   Anxiety    GAD (generalized anxiety disorder)    GERD (gastroesophageal reflux disease)    Headache    Hx of cardiovascular stress test    ETT-Myoview (03/2014):  EF 58%, no ischemia; normal   Peri-menopause    Past Surgical History:  Procedure Laterality Date   CESAREAN SECTION     DILITATION & CURRETTAGE/HYSTROSCOPY WITH ESSURE      Allergies  Allergies  Allergen Reactions   Banana Itching   Latex      Labs/Other Studies Reviewed    The following studies were reviewed today: CCTA 04/2021:  IMPRESSION: 1. Coronary calcium score of 9. This was 44 percentile for age and sex matched control.   2. Normal coronary origin with right dominance.   3. Proximal LAD stenosis of 50-69% non calcified plaque, sending for FFR.  IMPRESSION: 1.  Normal FFR.  LAD 0.91, normal.   Note: These examples are not recommendations of [employer] and only provided as examples of what other customers are doing.   Echo 04/2021: IMPRESSIONS    1. Left ventricular ejection fraction, by estimation, is 55 to 60%. The  left ventricle has normal function. The left ventricle has no regional  wall motion abnormalities. Left ventricular diastolic parameters are  consistent with Grade I diastolic  dysfunction (impaired relaxation). The average left ventricular global  longitudinal strain is -22.4 %. The global longitudinal strain is normal.   2. Right ventricular systolic function is normal. The right ventricular  size is normal. There is normal pulmonary artery systolic  pressure.   3. Left atrial size was mildly dilated.   4. The mitral valve is normal in structure. Trivial mitral valve  regurgitation. No evidence of mitral stenosis.   5. The aortic valve is tricuspid. Aortic valve regurgitation is not  visualized. No aortic stenosis is present.   6. The inferior vena cava is normal in size with greater than 50%  respiratory variability, suggesting right atrial pressure of 3 mmHg.   Recent Labs: 12/10/2022: ALT 8  Recent Lipid Panel    Component Value Date/Time   CHOL 156 12/10/2022 1417   TRIG 49 12/10/2022 1417   HDL 65 12/10/2022 1417   CHOLHDL 2.4 12/10/2022 1417   LDLCALC 81 12/10/2022 1417    History of Present Illness    67 year old female with the above past medical history including mild nonobstructive CAD, atypical chest pain, syncope, tobacco use, anxiety, and GERD.  Stress test in 2015 was normal. She was evaluated by cardiology following 2 syncopal episodes that occurred while attending a funeral.  Echocardiogram in 2018 was normal.  Cardiac monitor in 2018 revealed sinus rhythm, sinus bradycardia, sinus tach, no significant arrhythmia.  Most recent echocardiogram in 04/2021 showed EF 55 to 60%, normal function, no RWMA, G1 DD, normal RV systolic function, no significant valvular abnormalities.  Coronary CTA in 2022 revealed coronary calcium score of 9 (59 percentile), mild nonobstructive CAD in the proximal LAD.  She was last seen in the office on 07/06/2019.  Stable from a cardiac standpoint.  She denied  any symptoms concerning for angina.  She was evaluated in the ED on 12/19/2022 in the setting of left lower leg contusion.  Lower extremity duplex was negative for DVT.  Platelets were within normal limits. Conservative management was advised.  She contacted our office on 12/21/2022 with concern for worsening left lower extremity contusion.  She was advised to follow-up with her PCP.  She presents today for follow-up.  Since her last visit she  has been stable overall from a cardiac standpoint.  Her husband is now at home with hospice care following a difficult hospitalization.  She does not think he has much longer to live.  Her left lower extremity contusion has almost completely resolved. She notes intermittent leg pain with walking long distances and occasionally at rest.  She continues to smoke.  She has been under a significant amount of personal stress.  Home Medications    Current Outpatient Medications  Medication Sig Dispense Refill   atorvastatin (LIPITOR) 20 MG tablet Take 1 tablet (20 mg total) by mouth daily. 90 tablet 3   b complex vitamins capsule Take 1 capsule by mouth daily.     buPROPion ER (WELLBUTRIN SR) 100 MG 12 hr tablet Take 1 tablet (100 mg total) by mouth daily. 30 tablet 2   estradiol (VIVELLE-DOT) 0.075 MG/24HR APPLY 1 PATCH TWICE A WEEK     famotidine (PEPCID) 20 MG tablet Take 20 mg by mouth in the morning and at bedtime.     fluticasone (FLONASE) 50 MCG/ACT nasal spray Place 2 sprays into the nose.     hydrOXYzine (ATARAX) 10 MG tablet Take 1 tablet (10 mg total) by mouth 2 (two) times daily as needed for anxiety. 45 tablet 0   Magnesium Gluconate 550 MG TABS Take by mouth.     omeprazole (PRILOSEC) 20 MG capsule Take 20 mg by mouth in the morning.     progesterone (PROMETRIUM) 100 MG capsule Take 100 mg by mouth daily.  1   venlafaxine XR (EFFEXOR-XR) 150 MG 24 hr capsule Take 2 capsules (300 mg total) by mouth daily with breakfast. 60 capsule 2   fexofenadine (ALLEGRA) 180 MG tablet Take 180 mg by mouth daily. (Patient not taking: Reported on 12/30/2022)     No current facility-administered medications for this visit.     Review of Systems    She denies chest pain, palpitations, dyspnea, pnd, orthopnea, n, v, dizziness, syncope, edema, weight gain, or early satiety. All other systems reviewed and are otherwise negative except as noted above.   Physical Exam    VS:  BP (!) 154/89   Pulse 82    Ht 5' 2.5" (1.588 m)   Wt 147 lb 6.4 oz (66.9 kg)   LMP 11/11/2012   SpO2 98%   BMI 26.53 kg/m   GEN: Well nourished, well developed, in no acute distress. HEENT: normal. Neck: Supple, no JVD, carotid bruits, or masses. Cardiac: RRR, no murmurs, rubs, or gallops. No clubbing, cyanosis, edema.  Radials/DP/PT 2+ and equal bilaterally.  Respiratory:  Respirations regular and unlabored, clear to auscultation bilaterally. GI: Soft, nontender, nondistended, BS + x 4. MS: no deformity or atrophy. Skin: warm and dry, no rash. Neuro:  Strength and sensation are intact. Psych: Normal affect.  Accessory Clinical Findings    ECG personally reviewed by me today - No EKG in office today.     No results found for: "WBC", "HGB", "HCT", "MCV", "PLT" Lab Results  Component Value Date   CREATININE 0.63 04/22/2021  BUN 9 04/22/2021   NA 140 04/22/2021   K 5.1 04/22/2021   CL 106 04/22/2021   CO2 22 04/22/2021   Lab Results  Component Value Date   ALT 8 12/10/2022   AST 12 12/10/2022   ALKPHOS 101 12/10/2022   BILITOT 0.2 12/10/2022   Lab Results  Component Value Date   CHOL 156 12/10/2022   HDL 65 12/10/2022   LDLCALC 81 12/10/2022   TRIG 49 12/10/2022   CHOLHDL 2.4 12/10/2022    No results found for: "HGBA1C"  Assessment & Plan    1. Nonobstructive CAD/atypical chest pain: Coronary CTA in 2022 revealed coronary calcium score of 9 (59 percentile), mild nonobstructive CAD in the proximal LAD. Stable with no anginal symptoms. No indication for ischemic evaluation.  Continue Lipitor.  2. Elevated BP reading: BP is mildly elevated in office today, generally well-controlled.  She is under significant amount of stress.  Continue to monitor.  3. History of syncope: Cardiac monitor in 2018 revealed sinus rhythm, sinus bradycardia, sinus tach, no significant arrhythmia. Most recent echo in 04/2021 showed EF 55 to 60%, normal function, no RWMA, G1 DD, normal RV systolic function, no  significant valvular abnormalities.  She denies any recent syncope.  4. Hyperlipidemia: She was recently started on Lipitor.  LDL was 81 in 11/2022.  Goal LDL less than 70.  Consider repeat labs at next follow-up visit.  For now, continue Lipitor at current dose.  5. Tobacco use: She contines to smoke. Full cessation advised.   6. Left lower leg contusion/leg pain: Unclear etiology.  She denies any injury.  Lower extremity duplex was negative for DVT.  Platelets are within normal limits.  On exam today, contusion has almost completely resolved.  She notes intermittent leg pain with walking long distances and occasionally at rest.  She declines ABIs.  Continue to monitor symptoms.  7. Disposition: Follow-up in 6 months with Dr. Allyson Sabal.   Joylene Grapes, NP 12/30/2022, 10:33 AM

## 2023-01-27 ENCOUNTER — Ambulatory Visit (INDEPENDENT_AMBULATORY_CARE_PROVIDER_SITE_OTHER): Payer: Medicare (Managed Care) | Admitting: Psychiatry

## 2023-01-27 ENCOUNTER — Encounter (HOSPITAL_COMMUNITY): Payer: Self-pay | Admitting: Psychiatry

## 2023-01-27 VITALS — BP 132/76 | HR 55 | Temp 98.3°F | Ht <= 58 in | Wt 145.0 lb

## 2023-01-27 DIAGNOSIS — Z630 Problems in relationship with spouse or partner: Secondary | ICD-10-CM | POA: Diagnosis not present

## 2023-01-27 DIAGNOSIS — F341 Dysthymic disorder: Secondary | ICD-10-CM | POA: Diagnosis not present

## 2023-01-27 DIAGNOSIS — F411 Generalized anxiety disorder: Secondary | ICD-10-CM | POA: Diagnosis not present

## 2023-01-27 MED ORDER — VENLAFAXINE HCL ER 150 MG PO CP24: 300.0000 mg | ORAL_CAPSULE | Freq: Every day | ORAL | 2 refills | Status: DC

## 2023-01-27 MED ORDER — HYDROXYZINE HCL 10 MG PO TABS: 10.0000 mg | ORAL_TABLET | Freq: Every day | ORAL | 0 refills | Status: DC | PRN

## 2023-01-27 MED ORDER — BUPROPION HCL ER (SR) 100 MG PO TB12: 100.0000 mg | ORAL_TABLET | Freq: Every day | ORAL | 2 refills | Status: DC

## 2023-01-27 NOTE — Progress Notes (Signed)
Patient ID: Mallory [name], female   DOB: [birth year], 67 y.o.   MRN: [id number] [facility] MD Progress Note Tele psych 01/27/2023 10:51 AM Mallory [name]  MRN:  [id number] CC/ depression follow up  HPI  Returns for follow up , husband passed one month ago, he was sick and marriage was difficult  She is trying to move on and self grow  Tolerating medications  Stil gets worried when he puts her down and feel subdued due to that Discussed how to distract from negative judjement Overall meds helping keep some balance  Aggravating factor: finances, estate planning   Anxiety: doing fair  Depression: manageable  Modifying factors : friends, gardening Duration adult life Severity:  manageable   Principal Problem: GAD Diagnosis:   Patient Active Problem List   Diagnosis Date Noted   Tobacco abuse [Z72.0] 04/22/2021   Atypical chest pain [R07.89] 04/22/2021   Syncope [R55] 03/11/2017   Menopause [Z78.0] 12/03/2014   Marital conflict [Z63.0] 06/21/2014   GAD (generalized anxiety disorder) [F41.1]    GERD (gastroesophageal reflux disease) [K21.9]    Peri-menopause [N95.1]    Headache [R51.9]    Generalized anxiety disorder [F41.1] 03/05/2013   Total Time spent with patient: 30 minutes   Past Medical History:  Past Medical History:  Diagnosis Date   Anxiety    GAD (generalized anxiety disorder)    GERD (gastroesophageal reflux disease)    Headache    Hx of cardiovascular stress test    ETT-Myoview (03/2014):  EF 58%, no ischemia; normal   Peri-menopause     Past Surgical History:  Procedure Laterality Date   CESAREAN SECTION     DILITATION & CURRETTAGE/HYSTROSCOPY WITH ESSURE     Family History:  Family History  Problem Relation Age of Onset   Coronary artery disease Maternal Grandfather    Cancer Maternal Grandfather    Coronary artery disease Maternal Grandmother    Cancer Maternal Grandmother    Coronary artery disease Paternal Grandfather    Cancer Paternal Grandfather     Coronary artery disease Paternal Grandmother    Cancer Paternal Grandmother    Diabetes Cousin    Alcohol abuse Neg Hx    Anxiety disorder Neg Hx    Bipolar disorder Neg Hx    Depression Neg Hx    Drug abuse Neg Hx    OCD Neg Hx    Schizophrenia Neg Hx    ADD / ADHD Neg Hx    Social History:  Social History   Substance and Sexual Activity  Alcohol Use No   Alcohol/week: 0.0 standard drinks of alcohol     Social History   Substance and Sexual Activity  Drug Use No    Social History   Socioeconomic History   Marital status: Married    Spouse name: Not on file   Number of children: Not on file   Years of education: Not on file   Highest education level: Not on file  Occupational History   Not on file  Tobacco Use   Smoking status: Every Day    Packs/day: 0.50    Years: 39.00    Additional pack years: 0.00    Total pack years: 19.50    Types: Cigarettes   Smokeless tobacco: Never   Tobacco comments:    Working to quit currently as she is cutting back and wants to quit.  Vaping Use   Vaping Use: Never used  Substance and Sexual Activity   Alcohol use: No  Alcohol/week: 0.0 standard drinks of alcohol   Drug use: No   Sexual activity: Never    Partners: Male  Other Topics Concern   Not on file  Social History Narrative   Not on file   Social Determinants of Health   Financial Resource Strain: Not on file  Food Insecurity: Not on file  Transportation Needs: Not on file  Physical Activity: Not on file  Stress: Not on file  Social Connections: Not on file     Psychiatric Specialty Exam: Physical Exam  Review of Systems  Cardiovascular:  Negative for chest pain and palpitations.  Neurological:  Negative for tremors.  Psychiatric/Behavioral:  Negative for suicidal ideas.     Body mass index is 139.84 kg/m.  General Appearance:casual  Eye Contact:: fair  Speech:  Clear and Coherent  Volume:  Normal  Mood: fair  Affect:  congruent  Thought  Process:  Coherent and Goal Directed  Orientation:  Full (Time, Place, and Person)  Thought Content:  WDL  Suicidal Thoughts:  No  Homicidal Thoughts:  No  Memory:  NA  Judgement:  Intact  Insight:  Good and Present  Psychomotor Activity:  Normal  Concentration:  Good  Recall:  Good  Fund of Knowledge:Good  Language: Good  Akathisia:  No  Handed:  Right  AIMS (if indicated):     Assets:  Communication Skills Desire for Improvement Financial Resources/Insurance Housing Leisure Time Physical Health Resilience Social Support Talents/Skills Transportation  ADL's:  Intact  Cognition: WNL  Sleep:        Current Medications: Current Outpatient Medications  Medication Sig Dispense Refill   atorvastatin (LIPITOR) 20 MG tablet Take 1 tablet (20 mg total) by mouth daily. 90 tablet 3   b complex vitamins capsule Take 1 capsule by mouth daily.     estradiol (VIVELLE-DOT) 0.075 MG/24HR APPLY 1 PATCH TWICE A WEEK     famotidine (PEPCID) 20 MG tablet Take 20 mg by mouth in the morning and at bedtime.     fexofenadine (ALLEGRA) 180 MG tablet Take 180 mg by mouth daily.     fluticasone (FLONASE) 50 MCG/ACT nasal spray Place 2 sprays into the nose.     Magnesium Gluconate 550 MG TABS Take by mouth.     omeprazole (PRILOSEC) 20 MG capsule Take 20 mg by mouth in the morning.     progesterone (PROMETRIUM) 100 MG capsule Take 100 mg by mouth daily.  1   propranolol ER (INDERAL LA) 60 MG 24 hr capsule Take 60 mg by mouth at bedtime.     buPROPion ER (WELLBUTRIN SR) 100 MG 12 hr tablet Take 1 tablet (100 mg total) by mouth daily. 30 tablet 2   hydrOXYzine (ATARAX) 10 MG tablet Take 1 tablet (10 mg total) by mouth daily as needed for anxiety. 30 tablet 0   venlafaxine XR (EFFEXOR-XR) 150 MG 24 hr capsule Take 2 capsules (300 mg total) by mouth daily with breakfast. 60 capsule 2   No current facility-administered medications for this visit.    Lab Results: No results found for this or any  previous visit (from the past 48 hour(s)).  Physical Findings: AIMS:  CIWA:   COWS:    Treatment Plan Summary: Medication management  Prior documentation reviewed  MDD: fair handling grief, trying to remember good part not tough part, continue effexor  and wellbuttrin    GAD:  Manageble continue effexor   Relationship stress;  he has passed, trying to remember good times with  him  Reviewed meds    Meds renewed Direct care time including face to face 20 minutes including documentation , chart review  Fu 61m.  10:51 AM 01/27/2023

## 2023-06-02 ENCOUNTER — Ambulatory Visit (HOSPITAL_COMMUNITY): Payer: Medicare (Managed Care) | Admitting: Psychiatry

## 2023-06-09 ENCOUNTER — Telehealth (HOSPITAL_COMMUNITY): Payer: Self-pay | Admitting: *Deleted

## 2023-06-09 DIAGNOSIS — F411 Generalized anxiety disorder: Secondary | ICD-10-CM

## 2023-06-09 DIAGNOSIS — Z630 Problems in relationship with spouse or partner: Secondary | ICD-10-CM

## 2023-06-09 NOTE — Telephone Encounter (Signed)
Patient Refill Request-- Appt Reschedule due to provider out of office  venlafaxine XR (EFFEXOR-XR) 150 MG 24 hr capsule   buPROPion ER (WELLBUTRIN SR) 100 MG 12 hr tablet  [address] - [place], [place] [address]   Next Appt 07/22/13 Last  Appt 06/02/23

## 2023-06-10 MED ORDER — BUPROPION HCL ER (SR) 100 MG PO TB12: 100.0000 mg | ORAL_TABLET | Freq: Every day | ORAL | 0 refills | Status: DC

## 2023-06-10 MED ORDER — VENLAFAXINE HCL ER 150 MG PO CP24: 300.0000 mg | ORAL_CAPSULE | Freq: Every day | ORAL | 0 refills | Status: DC

## 2023-06-10 NOTE — Addendum Note (Signed)
Addended by: Thresa Ross on: 06/10/2023 08:32 AM   Modules accepted: Orders

## 2023-06-16 ENCOUNTER — Ambulatory Visit (HOSPITAL_COMMUNITY): Payer: Self-pay | Admitting: Psychiatry

## 2023-06-28 ENCOUNTER — Encounter (HOSPITAL_COMMUNITY): Payer: Self-pay | Admitting: Psychiatry

## 2023-06-28 ENCOUNTER — Other Ambulatory Visit: Payer: Self-pay | Admitting: Obstetrics and Gynecology

## 2023-06-28 ENCOUNTER — Ambulatory Visit (INDEPENDENT_AMBULATORY_CARE_PROVIDER_SITE_OTHER): Payer: Medicare (Managed Care) | Admitting: Psychiatry

## 2023-06-28 VITALS — BP 136/89 | HR 78 | Ht 63.0 in | Wt 140.0 lb

## 2023-06-28 DIAGNOSIS — F411 Generalized anxiety disorder: Secondary | ICD-10-CM | POA: Diagnosis not present

## 2023-06-28 DIAGNOSIS — Z630 Problems in relationship with spouse or partner: Secondary | ICD-10-CM | POA: Diagnosis not present

## 2023-06-28 DIAGNOSIS — F341 Dysthymic disorder: Secondary | ICD-10-CM | POA: Diagnosis not present

## 2023-06-28 DIAGNOSIS — Z1231 Encounter for screening mammogram for malignant neoplasm of breast: Secondary | ICD-10-CM

## 2023-06-28 MED ORDER — HYDROXYZINE HCL 10 MG PO TABS: 10.0000 mg | ORAL_TABLET | Freq: Every day | ORAL | Status: DC | PRN

## 2023-06-28 MED ORDER — VENLAFAXINE HCL ER 150 MG PO CP24: 300.0000 mg | ORAL_CAPSULE | Freq: Every day | ORAL | 1 refills | Status: DC

## 2023-06-28 MED ORDER — BUPROPION HCL ER (SR) 100 MG PO TB12: 100.0000 mg | ORAL_TABLET | Freq: Every day | ORAL | 2 refills | Status: DC

## 2023-06-28 NOTE — Progress Notes (Signed)
Patient ID: Mallory [name], female   DOB: Oct 05, 1955, 67 y.o.   MRN: [id number] [facility] MD Progress Note Tele psych 06/28/2023 3:17 PM Mallory [name]  MRN:  [id number] CC/ depression follow up  HPI  Has lost her husband earlier this year, went thru grief but it was difficult dealing with him as well   Later had a argument with daughter who wanted to clean up the house too quick after his death  In general doing fair,  Tolerating meds, spends time cleaning  Aggravating factor: finances, estate planning  Anxiety: doing fair  Depression: manageable   Modifying factors : friends, gardening Duration adult life Severity:  manageable   Principal Problem: GAD Diagnosis:   Patient Active Problem List   Diagnosis Date Noted   Tobacco abuse [Z72.0] 04/22/2021   Atypical chest pain [R07.89] 04/22/2021   Syncope [R55] 03/11/2017   Menopause [Z78.0] 12/03/2014   Marital conflict [Z63.0] 06/21/2014   GAD (generalized anxiety disorder) [F41.1]    GERD (gastroesophageal reflux disease) [K21.9]    Peri-menopause [N95.1]    Headache [R51.9]    Generalized anxiety disorder [F41.1] 03/05/2013   Total Time spent with patient: 30 minutes   Past Medical History:  Past Medical History:  Diagnosis Date   Anxiety    GAD (generalized anxiety disorder)    GERD (gastroesophageal reflux disease)    Headache    Hx of cardiovascular stress test    ETT-Myoview (03/2014):  EF 58%, no ischemia; normal   Peri-menopause     Past Surgical History:  Procedure Laterality Date   CESAREAN SECTION     DILITATION & CURRETTAGE/HYSTROSCOPY WITH ESSURE     Family History:  Family History  Problem Relation Age of Onset   Coronary artery disease Maternal Grandfather    Cancer Maternal Grandfather    Coronary artery disease Maternal Grandmother    Cancer Maternal Grandmother    Coronary artery disease Paternal Grandfather    Cancer Paternal Grandfather    Coronary artery disease Paternal Grandmother     Cancer Paternal Grandmother    Diabetes Cousin    Alcohol abuse Neg Hx    Anxiety disorder Neg Hx    Bipolar disorder Neg Hx    Depression Neg Hx    Drug abuse Neg Hx    OCD Neg Hx    Schizophrenia Neg Hx    ADD / ADHD Neg Hx    Social History:  Social History   Substance and Sexual Activity  Alcohol Use No   Alcohol/week: 0.0 standard drinks of alcohol     Social History   Substance and Sexual Activity  Drug Use No    Social History   Socioeconomic History   Marital status: Married    Spouse name: Not on file   Number of children: Not on file   Years of education: Not on file   Highest education level: Not on file  Occupational History   Not on file  Tobacco Use   Smoking status: Every Day    Current packs/day: 0.50    Average packs/day: 0.5 packs/day for 39.0 years (19.5 ttl pk-yrs)    Types: Cigarettes   Smokeless tobacco: Never   Tobacco comments:    Working to quit currently as she is cutting back and wants to quit.  Vaping Use   Vaping status: Never Used  Substance and Sexual Activity   Alcohol use: No    Alcohol/week: 0.0 standard drinks of alcohol   Drug use: No  Sexual activity: Never    Partners: Male  Other Topics Concern   Not on file  Social History Narrative   Not on file   Social Determinants of Health   Financial Resource Strain: Low Risk  (01/04/2023)   Received from [facility], [facility]   Overall Financial Resource Strain (CARDIA)    Difficulty of Paying Living Expenses: Not hard at all  Food Insecurity: No Food Insecurity (01/04/2023)   Received from [facility], [facility]   Hunger Vital Sign    Worried About Running Out of Food in the Last Year: Never true    Ran Out of Food in the Last Year: Never true  Transportation Needs: No Transportation Needs (01/04/2023)   Received from [facility], [facility]   PRAPARE - Transportation    Lack of Transportation (Medical): No    Lack of Transportation (Non-Medical): No   Physical Activity: Inactive (04/16/2021)   Received from [facility], [facility]   Exercise Vital Sign    Days of Exercise per Week: 0 days    Minutes of Exercise per Session: 0 min  Stress: Stress Concern Present (04/16/2021)   Received from [facility], [facility] of Occupational Health - Occupational Stress Questionnaire    Feeling of Stress : To some extent  Social Connections: Unknown (01/15/2022)   Received from [facility], [facility]   Social Network    Social Network: Not on file     Psychiatric Specialty Exam: Physical Exam  Review of Systems  Cardiovascular:  Negative for chest pain and palpitations.  Neurological:  Negative for tremors.  Psychiatric/Behavioral:  Negative for suicidal ideas.     Body mass index is 24.8 kg/m.  General Appearance:casual  Eye Contact:: fair  Speech:  Clear and Coherent  Volume:  Normal  Mood: fair  Affect:  congruent  Thought Process:  Coherent and Goal Directed  Orientation:  Full (Time, Place, and Person)  Thought Content:  WDL  Suicidal Thoughts:  No  Homicidal Thoughts:  No  Memory:  NA  Judgement:  Intact  Insight:  Good and Present  Psychomotor Activity:  Normal  Concentration:  Good  Recall:  Good  Fund of Knowledge:Good  Language: Good  Akathisia:  No  Handed:  Right  AIMS (if indicated):     Assets:  Communication Skills Desire for Improvement Financial Resources/Insurance Housing Leisure Time Physical Health Resilience Social Support Talents/Skills Transportation  ADL's:  Intact  Cognition: WNL  Sleep:        Current Medications: Current Outpatient Medications  Medication Sig Dispense Refill   atorvastatin (LIPITOR) 20 MG tablet Take 1 tablet (20 mg total) by mouth daily. 90 tablet 3   b complex vitamins capsule Take 1 capsule by mouth daily.     estradiol (VIVELLE-DOT) 0.075 MG/24HR APPLY 1 PATCH TWICE A WEEK     famotidine (PEPCID) 20 MG tablet Take 20 mg by mouth  in the morning and at bedtime.     fexofenadine (ALLEGRA) 180 MG tablet Take 180 mg by mouth daily.     fluticasone (FLONASE) 50 MCG/ACT nasal spray Place 2 sprays into the nose.     Magnesium Gluconate 550 MG TABS Take by mouth.     omeprazole (PRILOSEC) 20 MG capsule Take 20 mg by mouth in the morning.     progesterone (PROMETRIUM) 100 MG capsule Take 100 mg by mouth daily.  1   propranolol ER (INDERAL LA) 60 MG 24 hr capsule  Take 60 mg by mouth at bedtime.     buPROPion ER (WELLBUTRIN SR) 100 MG 12 hr tablet Take 1 tablet (100 mg total) by mouth daily. 30 tablet 2   hydrOXYzine (ATARAX) 10 MG tablet Take 1 tablet (10 mg total) by mouth daily as needed for anxiety.     venlafaxine XR (EFFEXOR-XR) 150 MG 24 hr capsule Take 2 capsules (300 mg total) by mouth daily with breakfast. 60 capsule 1   No current facility-administered medications for this visit.    Lab Results: No results found for this or any previous visit (from the past 48 hour(s)).  Physical Findings: AIMS:  CIWA:   COWS:    Treatment Plan Summary: Medication management  Prior documentation reviewed   MDD: fair handling it now, continue wellbutrin    GAD:  Manageable, continue effexor and prn vistaril helps    Relationship stress; he has passed,  Some stress with daughter and continue therapy to deal with stressors, continue effexor   Reviewed meds    Meds renewed Direct care time including face to face 20 minutes  including documentation , chart review  Fu 59m.  3:17 PM 06/28/2023

## 2023-07-27 ENCOUNTER — Encounter: Payer: Self-pay | Admitting: Cardiovascular Disease

## 2023-07-27 ENCOUNTER — Ambulatory Visit: Payer: Medicare (Managed Care) | Attending: Cardiovascular Disease | Admitting: Cardiovascular Disease

## 2023-07-27 VITALS — BP 122/60 | HR 51 | Ht 62.0 in | Wt 141.0 lb

## 2023-07-27 DIAGNOSIS — R931 Abnormal findings on diagnostic imaging of heart and coronary circulation: Secondary | ICD-10-CM | POA: Diagnosis not present

## 2023-07-27 DIAGNOSIS — Z720 Tobacco use: Secondary | ICD-10-CM | POA: Diagnosis not present

## 2023-07-27 DIAGNOSIS — R0789 Other chest pain: Secondary | ICD-10-CM

## 2023-07-27 DIAGNOSIS — E7800 Pure hypercholesterolemia, unspecified: Secondary | ICD-10-CM | POA: Diagnosis not present

## 2023-07-27 DIAGNOSIS — E785 Hyperlipidemia, unspecified: Secondary | ICD-10-CM | POA: Insufficient documentation

## 2023-07-27 MED ORDER — ATORVASTATIN CALCIUM 40 MG PO TABS: 40.0000 mg | ORAL_TABLET | Freq: Every day | ORAL | 3 refills | Status: DC

## 2023-07-27 NOTE — Progress Notes (Signed)
07/27/2023 Mallory [name]   06/18/1956  [id number]  Primary Physician Dow Adolph, PA Primary Cardiologist: Runell Gess MD Nicholes Calam[place], [place]  HPI:  Mallory [name] is a 67 y.o.   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  Unfortunately, her husband was chronically ill with cancer and other chronic illnesses, and who ultimately passed away/22/24.  They were married for 48 years..  She was referred by her PCP for cardiovascular evaluation because of 2 episodes of syncope while attending a funeral .I last saw her in the office 04/22/2021.  Her risk factors include 20-pack-years of tobacco abuse currently smoking one half pack per day. Otherwise, she has no cardiac risk factors. She's never had a heart attack or stroke and denies chest pain or shortness of breath. She was at a funeral and had 2 episodes of syncope in fairly rapid succession. He was hot out although she said she was hydrated. There is no nausea or other vagal associated symptoms. She had a 2-D echo which was essentially normal and an event monitor that was unrevealing.  I did get a 2D echo which was essentially normal as was an event monitor.  She has had no further episodes of syncope.   Since I saw her a year ago she is remained stable.  She did have a coronary CTA performed 05/04/2021 that revealed a coronary calcium score of 9 with mild proximal LAD plaquing and negative FFR.  She denies chest pain or shortness of breath.   Current Meds  Medication Sig   atorvastatin (LIPITOR) 20 MG tablet Take 1 tablet (20 mg total) by mouth daily.   b complex vitamins capsule Take 1 capsule by mouth daily.   buPROPion ER (WELLBUTRIN SR) 100 MG 12 hr tablet Take 1 tablet (100 mg total) by mouth daily.   estradiol (VIVELLE-DOT) 0.075 MG/24HR APPLY 1 PATCH TWICE A WEEK   famotidine (PEPCID) 20 MG tablet Take 20 mg by mouth in the morning and at bedtime.    fexofenadine (ALLEGRA) 180 MG tablet Take 180 mg by mouth daily.   fluticasone (FLONASE) 50 MCG/ACT nasal spray Place 2 sprays into the nose.   hydrOXYzine (ATARAX) 10 MG tablet Take 1 tablet (10 mg total) by mouth daily as needed for anxiety.   Magnesium Gluconate 550 MG TABS Take by mouth.   omeprazole (PRILOSEC) 20 MG capsule Take 20 mg by mouth in the morning.   progesterone (PROMETRIUM) 100 MG capsule Take 100 mg by mouth daily.   propranolol ER (INDERAL LA) 60 MG 24 hr capsule Take 60 mg by mouth at bedtime.   venlafaxine XR (EFFEXOR-XR) 150 MG 24 hr capsule Take 2 capsules (300 mg total) by mouth daily with breakfast.     Allergies  Allergen Reactions   Banana Itching   Latex     Social History   Socioeconomic History   Marital status: Married    Spouse name: Not on file   Number of children: Not on file   Years of education: Not on file   Highest education level: Not on file  Occupational History   Not on file  Tobacco Use   Smoking status: Every Day    Current packs/day: 0.50    Average packs/day: 0.5 packs/day for 39.0 years (19.5 ttl pk-yrs)    Types: Cigarettes   Smokeless tobacco: Never   Tobacco comments:    Working  to quit currently as she is cutting back and wants to quit.  Vaping Use   Vaping status: Never Used  Substance and Sexual Activity   Alcohol use: No    Alcohol/week: 0.0 standard drinks of alcohol   Drug use: No   Sexual activity: Never    Partners: Male  Other Topics Concern   Not on file  Social History Narrative   Not on file   Social Determinants of Health   Financial Resource Strain: Low Risk  (01/04/2023)   Received from [facility], [facility]   Overall Financial Resource Strain (CARDIA)    Difficulty of Paying Living Expenses: Not hard at all  Food Insecurity: No Food Insecurity (01/04/2023)   Received from [facility], [facility]   Hunger Vital Sign    Worried About Running Out of Food in the Last Year: Never true     Ran Out of Food in the Last Year: Never true  Transportation Needs: No Transportation Needs (01/04/2023)   Received from [facility], [facility]   PRAPARE - Transportation    Lack of Transportation (Medical): No    Lack of Transportation (Non-Medical): No  Physical Activity: Inactive (04/16/2021)   Received from [facility], [facility]   Exercise Vital Sign    Days of Exercise per Week: 0 days    Minutes of Exercise per Session: 0 min  Stress: Stress Concern Present (04/16/2021)   Received from [facility], [facility] of Occupational Health - Occupational Stress Questionnaire    Feeling of Stress : To some extent  Social Connections: Unknown (01/15/2022)   Received from [facility], [facility]   Social Network    Social Network: Not on file  Intimate Partner Violence: Not At Risk (12/19/2022)   Received from [facility], [facility]   HITS    Over the last 12 months how often did your partner physically hurt you?: Never    Over the last 12 months how often did your partner insult you or talk down to you?: Never    Over the last 12 months how often did your partner threaten you with physical harm?: Never    Over the last 12 months how often did your partner scream or curse at you?: Never     Review of Systems: General: negative for chills, fever, night sweats or weight changes.  Cardiovascular: negative for chest pain, dyspnea on exertion, edema, orthopnea, palpitations, paroxysmal nocturnal dyspnea or shortness of breath Dermatological: negative for rash Respiratory: negative for cough or wheezing Urologic: negative for hematuria Abdominal: negative for nausea, vomiting, diarrhea, bright red blood per rectum, melena, or hematemesis Neurologic: negative for visual changes, syncope, or dizziness All other systems reviewed and are otherwise negative except as noted above.    Blood pressure 122/60, pulse (!) 51, height 5\' 2"  (1.575 m), weight  141 lb (64 kg), last menstrual period 11/11/2012, SpO2 99%.  General appearance: alert and no distress Neck: no adenopathy, no carotid bruit, no JVD, supple, symmetrical, trachea midline, and thyroid not enlarged, symmetric, no tenderness/mass/nodules Lungs: clear to auscultation bilaterally Heart: regular rate and rhythm, S1, S2 normal, no murmur, click, rub or gallop Extremities: extremities normal, atraumatic, no cyanosis or edema Pulses: 2+ and symmetric Skin: Skin color, texture, turgor normal. No rashes or lesions Neurologic: Grossly normal  EKG EKG Interpretation Date/Time:  Wednesday July 27 2023 14:27:11 EST Ventricular Rate:  51 PR Interval:  128 QRS Duration:  72 QT Interval:  448 QTC Calculation: 412 R Axis:   26  Text Interpretation: Sinus bradycardia with Premature supraventricular complexes Low voltage QRS Septal infarct , age undetermined No previous ECGs available Confirmed by Nanetta Batty [phone]) on 07/27/2023 2:33:05 PM    ASSESSMENT AND PLAN:   Tobacco abuse Ongoing tobacco abuse of 1 pack/day recalcitrant to risk factor modification.  Hyperlipidemia History of hyperlipidemia on atorvastatin 20 mg a day with lipid profile performed 12/10/2022 revealing total cholesterol 156, LDL 81 and LDL 65.  She is not at goal for secondary prevention given her mildly elevated coronary calcium score.  Will increase her atorvastatin to 40 mg a day and we will recheck a lipid liver profile in 3 months.  Elevated coronary artery calcium score Coronary CTA performed 05/04/2021 showed a coronary calcium score of 9 with mild plaque in the proximal LAD.  FFR was negative.     Runell Gess MD FACP,FACC,FAHA, [facility] 07/27/2023 2:45 PM

## 2023-07-27 NOTE — Assessment & Plan Note (Signed)
Ongoing tobacco abuse of 1 pack/day recalcitrant to risk factor modification. 

## 2023-07-27 NOTE — Assessment & Plan Note (Signed)
History of hyperlipidemia on atorvastatin 20 mg a day with lipid profile performed 12/10/2022 revealing total cholesterol 156, LDL 81 and LDL 65.  She is not at goal for secondary prevention given her mildly elevated coronary calcium score.  Will increase her atorvastatin to 40 mg a day and we will recheck a lipid liver profile in 3 months.

## 2023-07-27 NOTE — Patient Instructions (Signed)
Medication Instructions:  Your physician has recommended you make the following change in your medication:   -Increase atorvastatin (lipitor) to 40mg  once daily.  *If you need a refill on your cardiac medications before your next appointment, please call your pharmacy*   Lab Work: Your physician recommends that you return for lab work in: 3 months for FASTING lipid/liver panel  If you have labs (blood work) drawn today and your tests are completely normal, you will receive your results only by: MyChart Message (if you have MyChart) OR A paper copy in the mail If you have any lab test that is abnormal or we need to change your treatment, we will call you to review the results.    Follow-Up: At [facility], you and your health needs are our priority.  As part of our continuing mission to provide you with exceptional heart care, we have created designated Provider Care Teams.  These Care Teams include your primary Cardiologist (physician) and Advanced Practice Providers (APPs -  Physician Assistants and Nurse Practitioners) who all work together to provide you with the care you need, when you need it.  We recommend signing up for the patient portal called "MyChart".  Sign up information is provided on this After Visit Summary.  MyChart is used to connect with patients for Virtual Visits (Telemedicine).  Patients are able to view lab/test results, encounter notes, upcoming appointments, etc.  Non-urgent messages can be sent to your provider as well.   To learn more about what you can do with MyChart, go to [email or web address].    Your next appointment:   12 month(s)  Provider:   Nanetta Batty, MD

## 2023-07-27 NOTE — Assessment & Plan Note (Signed)
Coronary CTA performed 05/04/2021 showed a coronary calcium score of 9 with mild plaque in the proximal LAD.  FFR was negative.

## 2023-08-10 ENCOUNTER — Ambulatory Visit: Payer: Medicare (Managed Care)

## 2023-09-27 ENCOUNTER — Ambulatory Visit (HOSPITAL_COMMUNITY): Payer: Medicare (Managed Care) | Admitting: Psychiatry

## 2023-09-29 ENCOUNTER — Other Ambulatory Visit (HOSPITAL_COMMUNITY): Payer: Self-pay | Admitting: Psychiatry

## 2023-09-29 DIAGNOSIS — F411 Generalized anxiety disorder: Secondary | ICD-10-CM

## 2023-09-29 DIAGNOSIS — Z630 Problems in relationship with spouse or partner: Secondary | ICD-10-CM

## 2023-10-04 ENCOUNTER — Ambulatory Visit (HOSPITAL_COMMUNITY): Payer: Medicare (Managed Care) | Admitting: Psychiatry

## 2023-11-08 ENCOUNTER — Ambulatory Visit (INDEPENDENT_AMBULATORY_CARE_PROVIDER_SITE_OTHER): Payer: Self-pay | Admitting: Psychiatry

## 2023-11-08 ENCOUNTER — Encounter (HOSPITAL_COMMUNITY): Payer: Self-pay | Admitting: Psychiatry

## 2023-11-08 VITALS — BP 133/85 | HR 61 | Ht 62.0 in | Wt 147.0 lb

## 2023-11-08 DIAGNOSIS — Z630 Problems in relationship with spouse or partner: Secondary | ICD-10-CM

## 2023-11-08 DIAGNOSIS — F411 Generalized anxiety disorder: Secondary | ICD-10-CM

## 2023-11-08 DIAGNOSIS — F341 Dysthymic disorder: Secondary | ICD-10-CM

## 2023-11-08 MED ORDER — VENLAFAXINE HCL ER 150 MG PO CP24: 300.0000 mg | ORAL_CAPSULE | Freq: Every day | ORAL | 2 refills | Status: DC

## 2023-11-08 MED ORDER — BUPROPION HCL ER (SR) 100 MG PO TB12: 100.0000 mg | ORAL_TABLET | Freq: Every day | ORAL | 2 refills | Status: DC

## 2023-11-08 NOTE — Progress Notes (Signed)
 Patient ID: Mallory [name], female   DOB: May 07, 1956, 68 y.o.   MRN: [id number] [facility] MD Progress Note Tele psych 11/08/2023 1:20 PM ROAN SAWCHUK  MRN:  [id number] CC/ depression follow up  HPI Patient doing fair, lost her husband and adjusting and doing estate planning Had difficult time with daughter while doing it  Gardening helps, goes out for it  In general doing fair,  Tolerating meds, spends time cleaning  Aggravating factor: estate planning  Anxiety: doing fair  Depression: manageable   Modifying factors : friends, gardening Duration adult life Severity:  manageable   Principal Problem: GAD Diagnosis:   Patient Active Problem List   Diagnosis Date Noted   Hyperlipidemia [E78.5] 07/27/2023   Elevated coronary artery calcium score [R93.1] 07/27/2023   Tobacco abuse [Z72.0] 04/22/2021   Atypical chest pain [R07.89] 04/22/2021   Syncope [R55] 03/11/2017   Menopause [Z78.0] 12/03/2014   Marital conflict [Z63.0] 06/21/2014   GAD (generalized anxiety disorder) [F41.1]    GERD (gastroesophageal reflux disease) [K21.9]    Peri-menopause [N95.1]    Headache [R51.9]    Generalized anxiety disorder [F41.1] 03/05/2013   Total Time spent with patient: 30 minutes   Past Medical History:  Past Medical History:  Diagnosis Date   Anxiety    GAD (generalized anxiety disorder)    GERD (gastroesophageal reflux disease)    Headache    Hx of cardiovascular stress test    ETT-Myoview (03/2014):  EF 58%, no ischemia; normal   Peri-menopause     Past Surgical History:  Procedure Laterality Date   CESAREAN SECTION     DILITATION & CURRETTAGE/HYSTROSCOPY WITH ESSURE     Family History:  Family History  Problem Relation Age of Onset   Coronary artery disease Maternal Grandfather    Cancer Maternal Grandfather    Coronary artery disease Maternal Grandmother    Cancer Maternal Grandmother    Coronary artery disease Paternal Grandfather    Cancer Paternal Grandfather     Coronary artery disease Paternal Grandmother    Cancer Paternal Grandmother    Diabetes Cousin    Alcohol abuse Neg Hx    Anxiety disorder Neg Hx    Bipolar disorder Neg Hx    Depression Neg Hx    Drug abuse Neg Hx    OCD Neg Hx    Schizophrenia Neg Hx    ADD / ADHD Neg Hx    Social History:  Social History   Substance and Sexual Activity  Alcohol Use No   Alcohol/week: 0.0 standard drinks of alcohol     Social History   Substance and Sexual Activity  Drug Use No    Social History   Socioeconomic History   Marital status: Married    Spouse name: Not on file   Number of children: Not on file   Years of education: Not on file   Highest education level: Not on file  Occupational History   Not on file  Tobacco Use   Smoking status: Every Day    Current packs/day: 0.50    Average packs/day: 0.5 packs/day for 39.0 years (19.5 ttl pk-yrs)    Types: Cigarettes   Smokeless tobacco: Never   Tobacco comments:    Working to quit currently as she is cutting back and wants to quit.  Vaping Use   Vaping status: Never Used  Substance and Sexual Activity   Alcohol use: No    Alcohol/week: 0.0 standard drinks of alcohol   Drug use: No  Sexual activity: Never    Partners: Male  Other Topics Concern   Not on file  Social History Narrative   Not on file   Social Drivers of Health   Financial Resource Strain: Low Risk  (01/04/2023)   Received from [facility], [facility]   Overall Financial Resource Strain (CARDIA)    Difficulty of Paying Living Expenses: Not hard at all  Food Insecurity: No Food Insecurity (01/04/2023)   Received from [facility], [facility]   Hunger Vital Sign    Worried About Running Out of Food in the Last Year: Never true    Ran Out of Food in the Last Year: Never true  Transportation Needs: No Transportation Needs (01/04/2023)   Received from [facility], [facility]   PRAPARE - Transportation    Lack of Transportation (Medical): No     Lack of Transportation (Non-Medical): No  Physical Activity: Inactive (04/16/2021)   Received from [facility], [facility]   Exercise Vital Sign    Days of Exercise per Week: 0 days    Minutes of Exercise per Session: 0 min  Stress: Stress Concern Present (04/16/2021)   Received from [facility], [facility] - Occupational Stress Questionnaire    Feeling of Stress : To some extent  Social Connections: Unknown (01/15/2022)   Received from [facility], [facility]   Social Network    Social Network: Not on file     Psychiatric Specialty Exam: Physical Exam  Review of Systems  Cardiovascular:  Negative for chest pain and palpitations.  Neurological:  Negative for tremors.  Psychiatric/Behavioral:  Negative for suicidal ideas.     Body mass index is 26.89 kg/m.  General Appearance:casual  Eye Contact:: fair  Speech:  Clear and Coherent  Volume:  Normal  Mood: fair  Affect:  congruent  Thought Process:  Coherent and Goal Directed  Orientation:  Full (Time, Place, and Person)  Thought Content:  WDL  Suicidal Thoughts:  No  Homicidal Thoughts:  No  Memory:  NA  Judgement:  Intact  Insight:  Good and Present  Psychomotor Activity:  Normal  Concentration:  Good  Recall:  Good  Fund of Knowledge:Good  Language: Good  Akathisia:  No  Handed:  Right  AIMS (if indicated):     Assets:  Communication Skills Desire for Improvement Financial Resources/Insurance Housing Leisure Time Physical Health Resilience Social Support Talents/Skills Transportation  ADL's:  Intact  Cognition: WNL  Sleep:        Current Medications: Current Outpatient Medications  Medication Sig Dispense Refill   atorvastatin (LIPITOR) 40 MG tablet Take 1 tablet (40 mg total) by mouth daily. 90 tablet 3   b complex vitamins capsule Take 1 capsule by mouth daily.     estradiol (VIVELLE-DOT) 0.075 MG/24HR APPLY 1 PATCH TWICE A WEEK     famotidine  (PEPCID) 20 MG tablet Take 20 mg by mouth in the morning and at bedtime.     fexofenadine (ALLEGRA) 180 MG tablet Take 180 mg by mouth daily.     fluticasone (FLONASE) 50 MCG/ACT nasal spray Place 2 sprays into the nose.     hydrOXYzine (ATARAX) 10 MG tablet Take 1 tablet (10 mg total) by mouth daily as needed for anxiety.     Magnesium Gluconate 550 MG TABS Take by mouth.     omeprazole (PRILOSEC) 20 MG capsule Take 20 mg by mouth in the morning.     progesterone (PROMETRIUM)  100 MG capsule Take 100 mg by mouth daily.  1   propranolol ER (INDERAL LA) 60 MG 24 hr capsule Take 60 mg by mouth at bedtime.     buPROPion ER (WELLBUTRIN SR) 100 MG 12 hr tablet Take 1 tablet (100 mg total) by mouth daily. 30 tablet 2   venlafaxine XR (EFFEXOR-XR) 150 MG 24 hr capsule Take 2 capsules (300 mg total) by mouth daily with breakfast. 60 capsule 2   No current facility-administered medications for this visit.    Lab Results: No results found for this or any previous visit (from the past 48 hours).  Physical Findings: AIMS:  CIWA:   COWS:    Treatment Plan Summary: Medication management  Prior documentation reviewed   MDD: fair continue wellbutrin GAD:  Manageable continue effexor   Direct care time including face to face 20 minutes  including documentation , chart review  Fu 4 m.  1:20 PM 11/08/2023

## 2024-01-21 LAB — HEPATIC FUNCTION PANEL
ALT: 9 IU/L (ref 0–32)
AST: 12 IU/L (ref 0–40)
Albumin: 4 g/dL (ref 3.9–4.9)
Alkaline Phosphatase: 127 IU/L — ABNORMAL HIGH (ref 44–121)
Bilirubin Total: <0.2 mg/dL (ref 0.0–1.2)
Bilirubin, Direct: <0.08 mg/dL (ref 0.00–0.40)
Total Protein: 6.5 g/dL (ref 6.0–8.5)

## 2024-01-21 LAB — LIPID PANEL
Chol/HDL Ratio: 2.8 ratio (ref 0.0–4.4)
Cholesterol, Total: 180 mg/dL (ref 100–199)
HDL: 64 mg/dL (ref >39)
LDL Chol Calc (NIH): 106 mg/dL — ABNORMAL HIGH (ref 0–99)
Triglycerides: 52 mg/dL (ref 0–149)
VLDL Cholesterol Cal: 10 mg/dL (ref 5–40)

## 2024-01-25 ENCOUNTER — Telehealth: Payer: Self-pay | Admitting: Cardiovascular Disease

## 2024-01-25 DIAGNOSIS — E7800 Pure hypercholesterolemia, unspecified: Secondary | ICD-10-CM

## 2024-01-25 MED ORDER — ATORVASTATIN CALCIUM 40 MG PO TABS: 40.0000 mg | ORAL_TABLET | Freq: Every day | ORAL | 3 refills | Status: AC

## 2024-01-25 NOTE — Telephone Encounter (Signed)
 Left message for pt to call.

## 2024-01-25 NOTE — Telephone Encounter (Signed)
 Spoke with pt, aware of results and recommendations. She reports she ran out of refills and was not taking the atorvastatin  when this lab work was done. Aware will send refill into the pharmacy and mail her the paperwork for repeat labs in 3 months.

## 2024-01-25 NOTE — Telephone Encounter (Signed)
Pt calling in for lab results

## 2024-01-26 ENCOUNTER — Ambulatory Visit: Payer: Self-pay

## 2024-02-28 ENCOUNTER — Ambulatory Visit (HOSPITAL_COMMUNITY): Payer: Medicare (Managed Care) | Admitting: Psychiatry

## 2024-03-06 ENCOUNTER — Other Ambulatory Visit: Payer: Self-pay | Admitting: Radiology

## 2024-03-07 LAB — SURGICAL PATHOLOGY

## 2024-03-08 ENCOUNTER — Telehealth: Payer: Self-pay | Admitting: *Deleted

## 2024-03-08 NOTE — Telephone Encounter (Signed)
 Spoke to patient to confirm upcoming afternoon [facility] clinic appointment on 7/2, paperwork will be sent via [facility].  Gave location and time, also informed patient that the surgeon's office would be calling as well to get information from them similar to the packet that they will be receiving so make sure to do both.  Reminded patient that all providers will be coming to the clinic to see them HERE and if they had any questions to not hesitate to reach back out to myself or their navigators.

## 2024-03-12 ENCOUNTER — Encounter: Payer: Self-pay | Admitting: *Deleted

## 2024-03-12 DIAGNOSIS — D0511 Intraductal carcinoma in situ of right breast: Secondary | ICD-10-CM | POA: Insufficient documentation

## 2024-03-13 NOTE — Progress Notes (Signed)
 Radiation Oncology         [phone] ________________________________  Multidisciplinary Breast Oncology Clinic [facility]) Initial Outpatient Consultation  Name: Mallory [name] MRN: [id number]  Date: 03/14/2024  DOB: 1955/09/24  RR:Duzczwd, Olam Shiver, PA  Aron Shoulders, MD   REFERRING PHYSICIAN: Aron Shoulders, MD  DIAGNOSIS: There were no encounter diagnoses.  Stage 0 (cTis (DCIS), cN0, cM0) Right Breast, Intermediate grade DCIS, ER+ / PR+ / Her2 not assessed  No diagnosis found.  HISTORY OF PRESENT ILLNESS::Mallory [name] is a 68 y.o. female who is presenting to the office today for evaluation of her newly diagnosed breast cancer. She is accompanied by ***. She is doing well overall.   {She had routine screening mammography on *** showing a possible abnormality in the *** breast.} She underwent bilateral diagnostic mammography with tomography and *** breast ultrasonography at {[facility]} on *** showing: ***.  Biopsy of the 8 o'clock right breast (5 cmfn) on 03/06/24 showed: intermediate grade DCIS involving a intraductal papillary proliferation with focal sclerosis. Prognostic indicators significant for: estrogen receptor, 100% positive and progesterone receptor, 70% positive, both with strong staining intensity. Her2 not assessed.   Menarche: *** years old Age at first live birth: *** years old GP: *** LMP: *** Contraceptive: *** HRT: ***   The patient was referred today for presentation in the multidisciplinary conference.  Radiology studies and pathology slides were presented there for review and discussion of treatment options.  A consensus was discussed regarding potential next steps.  PREVIOUS RADIATION THERAPY: {EXAM; YES/NO:19492::No}  PAST MEDICAL HISTORY:  Past Medical History:  Diagnosis Date   Anxiety    GAD (generalized anxiety disorder)    GERD (gastroesophageal reflux disease)    Headache    Hx of cardiovascular stress test    ETT-Myoview  (03/2014):  EF 58%, no ischemia; normal   Peri-menopause     PAST SURGICAL HISTORY: Past Surgical History:  Procedure Laterality Date   CESAREAN SECTION     DILITATION & CURRETTAGE/HYSTROSCOPY WITH ESSURE      FAMILY HISTORY:  Family History  Problem Relation Age of Onset   Coronary artery disease Maternal Grandfather    Cancer Maternal Grandfather    Coronary artery disease Maternal Grandmother    Cancer Maternal Grandmother    Coronary artery disease Paternal Grandfather    Cancer Paternal Grandfather    Coronary artery disease Paternal Grandmother    Cancer Paternal Grandmother    Diabetes Cousin    Alcohol abuse Neg Hx    Anxiety disorder Neg Hx    Bipolar disorder Neg Hx    Depression Neg Hx    Drug abuse Neg Hx    OCD Neg Hx    Schizophrenia Neg Hx    ADD / ADHD Neg Hx     SOCIAL HISTORY:  Social History   Socioeconomic History   Marital status: Married    Spouse name: Not on file   Number of children: Not on file   Years of education: Not on file   Highest education level: Not on file  Occupational History   Not on file  Tobacco Use   Smoking status: Every Day    Current packs/day: 0.50    Average packs/day: 0.5 packs/day for 39.0 years (19.5 ttl pk-yrs)    Types: Cigarettes   Smokeless tobacco: Never   Tobacco comments:    Working to quit currently as she is cutting back and wants to quit.  Vaping Use   Vaping  status: Never Used  Substance and Sexual Activity   Alcohol use: No    Alcohol/week: 0.0 standard drinks of alcohol   Drug use: No   Sexual activity: Never    Partners: Male  Other Topics Concern   Not on file  Social History Narrative   Not on file   Social Drivers of Health   Financial Resource Strain: Low Risk  (11/16/2023)   Received [facility]   Overall Financial Resource Strain (CARDIA)    Difficulty of Paying Living Expenses: Not hard at all  Food Insecurity: No Food Insecurity (11/16/2023)   Received from [facility]    Hunger Vital Sign    Within the past 12 months, you worried that your food would run out before you got the money to buy more.: Never true    Within the past 12 months, the food you bought just didn't last and you didn't have money to get more.: Never true  Transportation Needs: No Transportation Needs (11/16/2023)   Received from [facility] - Transportation    Lack of Transportation (Medical): No    Lack of Transportation (Non-Medical): No  Physical Activity: Inactive (04/16/2021)   Received from [facility]   Exercise Vital Sign    On average, how many days per week do you engage in moderate to strenuous exercise (like a brisk walk)?: 0 days    On average, how many minutes do you engage in exercise at this level?: 0 min  Stress: Stress Concern Present (04/16/2021)   Received from [facility] of Occupational Health - Occupational Stress Questionnaire    Feeling of Stress : To some extent  Social Connections: Unknown (01/15/2022)   Received from [facility]   Social Network    Social Network: Not on file    ALLERGIES:  Allergies  Allergen Reactions   Banana Itching   Latex     MEDICATIONS:  Current Outpatient Medications  Medication Sig Dispense Refill   atorvastatin  (LIPITOR) 40 MG tablet Take 1 tablet (40 mg total) by mouth daily. 90 tablet 3   b complex vitamins capsule Take 1 capsule by mouth daily.     buPROPion  ER (WELLBUTRIN  SR) 100 MG 12 hr tablet Take 1 tablet (100 mg total) by mouth daily. 30 tablet 2   estradiol (VIVELLE-DOT) 0.075 MG/24HR APPLY 1 PATCH TWICE A WEEK     famotidine (PEPCID) 20 MG tablet Take 20 mg by mouth in the morning and at bedtime.     fexofenadine (ALLEGRA) 180 MG tablet Take 180 mg by mouth daily.     fluticasone (FLONASE) 50 MCG/ACT nasal spray Place 2 sprays into the nose.     hydrOXYzine  (ATARAX ) 10 MG tablet Take 1 tablet (10 mg total) by mouth daily as needed for anxiety.     Magnesium Gluconate 550 MG  TABS Take by mouth.     omeprazole (PRILOSEC) 20 MG capsule Take 20 mg by mouth in the morning.     progesterone (PROMETRIUM) 100 MG capsule Take 100 mg by mouth daily.  1   propranolol ER (INDERAL LA) 60 MG 24 hr capsule Take 60 mg by mouth at bedtime.     venlafaxine  XR (EFFEXOR -XR) 150 MG 24 hr capsule Take 2 capsules (300 mg total) by mouth daily with breakfast. 60 capsule 2   No current facility-administered medications for this encounter.    REVIEW OF SYSTEMS: A 10+ POINT REVIEW OF SYSTEMS WAS OBTAINED including neurology,  dermatology, psychiatry, cardiac, respiratory, lymph, extremities, GI, GU, musculoskeletal, constitutional, reproductive, HEENT. On the provided form, she reports ***. She denies *** and any other symptoms.    PHYSICAL EXAM:  vitals were not taken for this visit.  {may need to copy over vitals} Lungs are clear to auscultation bilaterally. Heart has regular rate and rhythm. No palpable cervical, supraclavicular, or axillary adenopathy. Abdomen soft, non-tender, normal bowel sounds. Breast: Left breast with no palpable mass, nipple discharge, or bleeding. Right breast with ***.   KPS = ***  100 - Normal; no complaints; no evidence of disease. 90   - Able to carry on normal activity; minor signs or symptoms of disease. 80   - Normal activity with effort; some signs or symptoms of disease. 98   - Cares for self; unable to carry on normal activity or to do active work. 60   - Requires occasional assistance, but is able to care for most of his personal needs. 50   - Requires considerable assistance and frequent medical care. 40   - Disabled; requires special care and assistance. 30   - Severely disabled; hospital admission is indicated although death not imminent. 20   - Very sick; hospital admission necessary; active supportive treatment necessary. 10   - Moribund; fatal processes progressing rapidly. 0     - Dead  Karnofsky DA, Abelmann WH, Craver LS and [employer]  [phone]) The use of the nitrogen mustards in the palliative treatment of carcinoma: with particular reference to bronchogenic carcinoma Cancer 1 634-56  LABORATORY DATA:  No results found for: WBC, HGB, HCT, MCV, PLT Lab Results  Component Value Date   NA 140 04/22/2021   K 5.1 04/22/2021   CL 106 04/22/2021   CO2 22 04/22/2021   Lab Results  Component Value Date   ALT 9 01/20/2024   AST 12 01/20/2024   ALKPHOS 127 (H) 01/20/2024   BILITOT <0.2 01/20/2024    PULMONARY FUNCTION TEST:   Review Flowsheet        No data to display          RADIOGRAPHY: No results found.    IMPRESSION: Stage 0 (cTis (DCIS), cN0, cM0) Right Breast, Intermediate grade DCIS, ER+ / PR+ / Her2 not assessed  Patient will be a good candidate for breast conservation with radiotherapy to the right breast. We discussed the general course of radiation, potential side effects, and toxicities with radiation and the patient is interested in this approach. ***   PLAN:  *** (copy notes from board at conference)   ------------------------------------------------  Lynwood CHARM Nasuti, PhD, MD  This document serves as a record of services personally performed by Lynwood Nasuti, MD. It was created on his behalf by Dorthy Fuse, a trained medical scribe. The creation of this record is based on the scribe's personal observations and the provider's statements to them. This document has been checked and approved by the attending provider.

## 2024-03-14 ENCOUNTER — Other Ambulatory Visit: Payer: Self-pay | Admitting: General Surgery

## 2024-03-14 ENCOUNTER — Inpatient Hospital Stay: Payer: Medicare (Managed Care) | Attending: Internal Medicine

## 2024-03-14 ENCOUNTER — Inpatient Hospital Stay: Payer: Medicare (Managed Care) | Admitting: Hematology and Oncology

## 2024-03-14 ENCOUNTER — Inpatient Hospital Stay: Payer: Medicare (Managed Care) | Admitting: Genetic Counselor

## 2024-03-14 ENCOUNTER — Ambulatory Visit
Admission: RE | Admit: 2024-03-14 | Discharge: 2024-03-14 | Disposition: A | Discharge status: Home / Self Care | Payer: Medicare (Managed Care) | Source: Ambulatory Visit | Attending: Radiation Oncology | Admitting: Radiation Oncology

## 2024-03-14 ENCOUNTER — Ambulatory Visit: Payer: Medicare (Managed Care) | Admitting: Physical Therapy

## 2024-03-14 ENCOUNTER — Encounter: Payer: Self-pay | Admitting: Radiology

## 2024-03-14 ENCOUNTER — Encounter: Payer: Self-pay | Admitting: *Deleted

## 2024-03-14 VITALS — BP 126/70 | HR 64 | Temp 97.8°F | Resp 16 | Ht 62.0 in | Wt <= 1120 oz

## 2024-03-14 DIAGNOSIS — Z79899 Other long term (current) drug therapy: Secondary | ICD-10-CM | POA: Insufficient documentation

## 2024-03-14 DIAGNOSIS — D0511 Intraductal carcinoma in situ of right breast: Secondary | ICD-10-CM

## 2024-03-14 DIAGNOSIS — C439 Malignant melanoma of skin, unspecified: Secondary | ICD-10-CM

## 2024-03-14 DIAGNOSIS — Z170 Estrogen receptor positive status [ER+]: Secondary | ICD-10-CM

## 2024-03-14 DIAGNOSIS — C50511 Malignant neoplasm of lower-outer quadrant of right female breast: Secondary | ICD-10-CM

## 2024-03-14 DIAGNOSIS — Z808 Family history of malignant neoplasm of other organs or systems: Secondary | ICD-10-CM

## 2024-03-14 DIAGNOSIS — F17210 Nicotine dependence, cigarettes, uncomplicated: Secondary | ICD-10-CM | POA: Diagnosis not present

## 2024-03-14 LAB — CBC WITH DIFFERENTIAL (CANCER CENTER ONLY)
Abs Immature Granulocytes: 0.04 10*3/uL (ref 0.00–0.07)
Basophils Absolute: 0.1 10*3/uL (ref 0.0–0.1)
Basophils Relative: 1 %
Eosinophils Absolute: 0.5 10*3/uL (ref 0.0–0.5)
Eosinophils Relative: 5 %
HCT: 38 % (ref 36.0–46.0)
Hemoglobin: 12.4 g/dL (ref 12.0–15.0)
Immature Granulocytes: 0 %
Lymphocytes Relative: 30 %
Lymphs Abs: 3 10*3/uL (ref 0.7–4.0)
MCH: 30 pg (ref 26.0–34.0)
MCHC: 32.6 g/dL (ref 30.0–36.0)
MCV: 91.8 fL (ref 80.0–100.0)
Monocytes Absolute: 0.7 10*3/uL (ref 0.1–1.0)
Monocytes Relative: 7 %
Neutro Abs: 5.6 10*3/uL (ref 1.7–7.7)
Neutrophils Relative %: 57 %
Platelet Count: 259 10*3/uL (ref 150–400)
RBC: 4.14 MIL/uL (ref 3.87–5.11)
RDW: 14 % (ref 11.5–15.5)
WBC Count: 9.9 10*3/uL (ref 4.0–10.5)
nRBC: 0 % (ref 0.0–0.2)

## 2024-03-14 LAB — CMP (CANCER CENTER ONLY)
ALT: 9 U/L (ref 0–44)
AST: 14 U/L — ABNORMAL LOW (ref 15–41)
Albumin: 3.9 g/dL (ref 3.5–5.0)
Alkaline Phosphatase: 96 U/L (ref 38–126)
Anion gap: 7 (ref 5–15)
BUN: 18 mg/dL (ref 8–23)
CO2: 28 mmol/L (ref 22–32)
Calcium: 8.9 mg/dL (ref 8.9–10.3)
Chloride: 106 mmol/L (ref 98–111)
Creatinine: 0.87 mg/dL (ref 0.44–1.00)
GFR, Estimated: >60 mL/min (ref >60)
Glucose, Bld: 110 mg/dL — ABNORMAL HIGH (ref 70–99)
Potassium: 4 mmol/L (ref 3.5–5.1)
Sodium: 141 mmol/L (ref 135–145)
Total Bilirubin: 0.4 mg/dL (ref 0.0–1.2)
Total Protein: 6.8 g/dL (ref 6.5–8.1)

## 2024-03-14 LAB — GENETIC SCREENING ORDER

## 2024-03-14 NOTE — Progress Notes (Signed)
 REFERRING PHYSICIAN:  Vonzell   PROVIDER:  JINA CLAIR NEPHEW, MD  Care Team: Patient Care Team: Lelon Rollene Dragon, DO as PCP - General (Internal Medicine) Curlene Lynwood BRAVO, MD as Referring Physician (Obstetrics and Gynecology) Edna Toribio LABOR, MD as Referring Physician (Orthopedic Surgery) Odean Mackey POUR, MD as Referring Physician (Hematology and Oncology) Shannon Lynwood BIRCH, MD as Referring Physician (Radiation Oncology) NEPHEW JINA CLAIR, MD as Consulting Provider (Surgical Oncology) Berry, Jonathan [place], MD as Referring Physician (Cardiovascular Disease)   MRN: [id number] DOB: 03/08/1956 DATE OF ENCOUNTER: 03/14/2024  Subjective   Chief Complaint: New Consultation (Breast cancer)     History of Present Illness: Mallory [name] is a 68 y.o. female who is seen today as an office consultation at the request of Dr. Gudena for evaluation of New Consultation (Breast cancer)   Patient presents with a new diagnosis of right breast cancer June 2025.  The patient had a palpable mass.  She subsequently underwent diagnostic imaging.  This demonstrated 8 mm mass in the lower outer quadrant on the right.  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  This is ER and PR positive.  The patient has no personal or family history of breast cancer, but does have a history of melanoma in herself and her mother.  Patient lives with her goldendoodle.  Patient's recent health has also been plagued by vaginal bleeding which is being worked up by Dr. Tomblin.  Preliminary workup seems to indicate this is a fibroid.  She also has severe hip arthritis and is working toward a hip replacement.  She is going to be getting an injection next week as she is going to have to delay this for treatment of her breast cancer.   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   Diagnostic mammogram:[facility] 02/29/24   [facility] 02/29/24    Pathology core needle biopsy: 03/06/24   Receptors:    Review of Systems: A complete review of systems was obtained from the patient.  I have reviewed this information and discussed as appropriate with the patient.  See HPI as well for other ROS. ROS - severe hip pain, vaginal bleeding, see above.     Medical History: Past Medical History:  Diagnosis Date  . Anxiety   . Arthritis   . GERD (gastroesophageal reflux disease)   . History of cancer   . Hyperlipidemia   . Thyroid disease     Patient Active Problem List  Diagnosis  . Malignant neoplasm of lower-outer quadrant of right breast of female, estrogen receptor positive (CMS/HHS-HCC)  . Acquired asplenia  . Arthritis  . History of melanoma  . Hyperlipidemia  . Multinodular goiter (nontoxic)    Past Surgical History:  Procedure Laterality Date  . CESAREAN SECTION    . DILATION & CURETTAGE CERVIX    . EXPLORATORY LAPAROTOMY    . HERNIA REPAIR    . SPLENECTOMY       Allergies  Allergen Reactions  . Banana Itching  . Latex Itching, Other (See Comments) and Rash    Tingling all over.    Current Outpatient Medications on File Prior to Visit  Medication Sig Dispense Refill  . atorvastatin  (LIPITOR) 40 MG tablet Take 40 mg by mouth once daily    . buPROPion  (WELLBUTRIN  SR) 100 MG SR tablet Take 100  mg by mouth once daily    . hydrOXYzine  HCL (ATARAX ) 10 MG tablet Take 10 mg by mouth 2 (two) times daily as needed    . magnesium gluconate 30 mg (550 mg) Tab Take 30 mg by mouth once daily    . omeprazole (PRILOSEC) 20 MG DR capsule Take 20 mg by mouth once daily    . progesterone (PROMETRIUM) 100 MG capsule Take 100 mg by mouth once daily    . propranoloL (INDERAL LA) 60 MG LA capsule Take 60 mg by mouth once daily    . venlafaxine  (EFFEXOR -XR) 150 MG XR capsule TAKE 2 CAPSULES BY MOUTH ONCE  DAILY WITH BREAKFAST    . estradioL (CLIMARA) 0.075 mg/24 hr patch 1 PATCH TWICE WEEKLY    . famotidine (PEPCID) 20 MG tablet Take 20 mg by mouth 2 (two) times daily    . fexofenadine (ALLEGRA) 180 MG tablet Take 180 mg by mouth     No current facility-administered medications on file prior to visit.    Family History  Problem Relation Age of Onset  . Skin cancer Mother      Social History   Tobacco Use  Smoking Status Every Day  . Current packs/day: 0.50  . Types: Cigarettes  Smokeless Tobacco Not on file     Social History   Socioeconomic History  . Marital status: Married  Tobacco Use  . Smoking status: Every Day    Current packs/day: 0.50    Types: Cigarettes  Vaping Use  . Vaping status: Never Used  Substance and Sexual Activity  . Drug use: Never   Social Drivers of Corporate investment banker Strain: Low Risk  (11/16/2023)   Received from [facility]   Overall Financial Resource Strain (CARDIA)   . Difficulty of Paying Living Expenses: Not hard at all  Food Insecurity: No Food Insecurity (11/16/2023)   [facility]   Hunger Vital Sign   . Within the past 12 months, you worried that your food would run out before you got the money to buy more.: Never true   . Within the past 12 months, the food you bought just didn't last and you didn't have money to get more.: Never true  Transportation Needs: No Transportation Needs (11/16/2023)   Received from [facility]   PRAPARE - Transportation   . Lack of Transportation (Medical): No   . Lack of Transportation (Non-Medical): No  Physical Activity: Inactive (04/16/2021)   Received from [facility]   Exercise Vital Sign   . On average, how many days per week do you engage in moderate to strenuous exercise (like a brisk walk)?: 0 days   . On average, how many minutes do you engage in exercise at this level?: 0 min  Stress: Stress Concern Present (04/16/2021)   Received from [facility] - Occupational Stress Questionnaire   . Feeling of Stress : To some extent  Housing Stability: Low Risk  (11/16/2023)   Received from [facility] Stability Vital Sign   . In the last 12 months, was there a time when you were not able to pay the mortgage or rent on time?: No   . In the past 12 months, how many times have you moved where you were living?: 1   . At any time in the past 12 months, were you homeless or living in a shelter (including now)?: No    Objective:  Vitals:   03/14/24 1711  BP: 126/70  Pulse: 64  Resp: 16  Temp: 36.6 C (97.8 F)  Weight: 65.8 kg (145 lb)  Height: 157.5 cm (5' 2)    Body mass index is 26.52 kg/m.  Gen:  No acute distress.  Well nourished and well groomed.   Neurological: Alert and oriented to person, place, and time. Coordination normal.  Head: Normocephalic and atraumatic.  Eyes: Conjunctivae are normal. Pupils are equal, round, and reactive to light. No scleral icterus.  Neck: Normal range of motion. Neck supple. No tracheal deviation. + thyromegaly.   Cardiovascular: Normal rate, regular rhythm, normal heart sounds and intact distal pulses.  Exam reveals no gallop and no friction rub.  No murmur heard. Breast: There is a small 5 to 10 mm palpable mass on the inferolateral right breast.  There is a small scar at the end inferior areolar border on the right breast.  This is well-healed.  Right breast slightly smaller than left.  Mild ptosis bilaterally.  Denser than would be expected for her age.  No axillary adenopathy.  No nipple retraction or nipple discharge.  No contour abnormalities. Respiratory: Effort normal.  No respiratory distress. No chest wall tenderness. Breath sounds normal.  No wheezes, rales or rhonchi.  GI: Soft. Bowel sounds are normal. The abdomen is soft and nontender.  There is no rebound and no guarding.  Musculoskeletal: Normal range of motion. Extremities are nontender.   Lymphadenopathy: No cervical, preauricular, postauricular or axillary adenopathy is present Skin: Skin is warm and dry. No rash noted. No diaphoresis. No erythema. No pallor. No clubbing, cyanosis, or edema.   Psychiatric: Normal mood and affect. Behavior is normal. Judgment and thought content normal.    Labs CBC, CMET essentially normal  Assessment and Plan:     ICD-10-CM   1. Malignant neoplasm of lower-outer quadrant of right breast of female, estrogen receptor positive (CMS/HHS-HCC)  C50.511    Z17.0     2. Acquired asplenia  Z90.81     3. History of melanoma  Z85.820      Patient has a new diagnosis of stage 0 right breast cancer.  This is amenable to breast conservation.  Seed localized lumpectomy will be followed by radiation and antihormonal treatment.  I discussed surgery with the patient.  The patient is being referred for genetic testing if desired.  The surgical procedure was described to the patient.  I discussed the incision type and location and that we would need radiology involved pre op to place a seed 1-2 days pre op.     We discussed the risks bleeding, infection, damage to other structures, need for further procedures/surgeries.  We discussed the risk of seroma.  The patient was advised that we may need to go back to surgery for additional tissue to obtain negative margins or for a lymph node biopsy. The patient was advised that these are the most common complications, but that others can occur as well. I discussed the risk of alteration in breast contour or size.  I discussed risk of chronic pain.  There are rare instances of heart/lung issues post op as well as blood clots.     They were advised against taking aspirin or other anti-inflammatory agents/blood thinners the week before surgery.    The risks and benefits of the procedure were described to the patient and she wishes to proceed.     No follow-ups on file.   Jina LITTIE Nephew, MD FACS Surgical  Oncology, General Surgery, Trauma and Critical Care [facility] \  Patient's films and pathology as well as care plan were reviewed in multidisciplinary fashion today.  Highly complex medical decision making.

## 2024-03-14 NOTE — Assessment & Plan Note (Addendum)
 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 It is considered a precancerous lesion. DCIS is classified as a 0. It is generally detected through mammograms as calcifications. We discussed the significance of grades and its impact on prognosis. We also discussed the importance of ER and PR receptors and their implications to adjuvant treatment options. Prognosis of DCIS dependence on grade, comedo necrosis. It is anticipated that if not treated, 20-30% of DCIS can develop into invasive breast cancer.  Recommendation: 1. Breast conserving surgery 2. Followed by adjuvant radiation therapy 3. Followed by antiestrogen therapy with tamoxifen 5 years  Tamoxifen counseling: We discussed the risks and benefits of tamoxifen. These include but not limited to insomnia, hot flashes, mood changes, vaginal dryness, and weight gain. Although rare, serious side effects including endometrial cancer, risk of blood clots were also discussed. We strongly believe that the benefits far outweigh the risks. Patient understands these risks and consented to starting treatment. Planned treatment duration is 5 years.  Return to clinic after radiation for follow-up

## 2024-03-14 NOTE — Progress Notes (Signed)
 [facility] CONSULT NOTE  Patient Care Team: Mannie Olam Shiver, PA as PCP - General (Physician Assistant) Court Dorn PARAS, MD as PCP - Cardiology (Cardiology) Tyree Nanetta SAILOR, RN as Oncology Nurse Navigator Aron Shoulders, MD as Consulting Physician (General Surgery) Odean Potts, MD as Consulting Physician (Hematology and Oncology) Shannon Agent, MD as Consulting Physician (Radiation Oncology)  CHIEF COMPLAINTS/PURPOSE OF CONSULTATION:  Newly diagnosed right breast DCIS  HISTORY OF PRESENTING ILLNESS: [name] is a 68 year old who was recently diagnosed with DCIS of the right breast.  She felt a lump in the right breast for couple of weeks led to mammogram and ultrasound which detected a 0.8 cm mass which on biopsy came back as intermediate grade DCIS that is ER/PR positive.  She was presented this morning to the multidiscipline tumor board and she is here today to discuss treatment plan.   I reviewed her records extensively and collaborated the history with the patient.  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, ER 100%, PR 70%   03/14/2024 Cancer Staging   Staging form: Breast, AJCC 8th Edition - Clinical stage from 03/14/2024: Stage 0 (cTis (DCIS), cN0, cM0, ER+, PR+, HER2: Not Assessed) - Signed by Odean Potts, MD on 03/14/2024 Stage prefix: Initial diagnosis Nuclear grade: G2 Laterality: Right Staged by: Pathologist and managing physician Stage used in treatment planning: Yes National guidelines used in treatment planning: Yes Type of national guideline used in treatment planning: NCCN      MEDICAL HISTORY:  Past Medical History:  Diagnosis Date   Anxiety    GAD (generalized anxiety disorder)    GERD  (gastroesophageal reflux disease)    Headache    Hx of cardiovascular stress test    ETT-Myoview (03/2014):  EF 58%, no ischemia; normal   Peri-menopause     SURGICAL HISTORY: Past Surgical History:  Procedure Laterality Date   CESAREAN SECTION     DILITATION & CURRETTAGE/HYSTROSCOPY WITH ESSURE      SOCIAL HISTORY: Social History   Socioeconomic History   Marital status: Married    Spouse name: Not on file   Number of children: Not on file   Years of education: Not on file   Highest education level: Not on file  Occupational History   Not on file  Tobacco Use   Smoking status: Every Day    Current packs/day: 0.50    Average packs/day: 0.5 packs/day for 39.0 years (19.5 ttl pk-yrs)    Types: Cigarettes   Smokeless tobacco: Never   Tobacco comments:    Working to quit currently as she is cutting back and wants to quit.  Vaping Use   Vaping status: Never Used  Substance and Sexual Activity   Alcohol use: No    Alcohol/week: 0.0 standard drinks of alcohol   Drug use: No   Sexual activity: Never    Partners: Male  Other Topics Concern   Not on file  Social History Narrative   Not on file   Social Drivers of Health   Financial Resource Strain: Low Risk  (11/16/2023)   Received from [facility]   Overall Financial Resource Strain (CARDIA)    Difficulty of Paying Living Expenses: Not hard at all  Food Insecurity: No Food Insecurity (11/16/2023)  Received from [facility]   Hunger Vital Sign    Within the past 12 months, you worried that your food would run out before you got the money to buy more.: Never true    Within the past 12 months, the food you bought just didn't last and you didn't have money to get more.: Never true  Transportation Needs: No Transportation Needs (11/16/2023)   Received from [facility]   PRAPARE - Transportation    Lack of Transportation (Medical): No    Lack of Transportation (Non-Medical): No  Physical Activity: Inactive (04/16/2021)    Received from [facility]   Exercise Vital Sign    On average, how many days per week do you engage in moderate to strenuous exercise (like a brisk walk)?: 0 days    On average, how many minutes do you engage in exercise at this level?: 0 min  Stress: Stress Concern Present (04/16/2021)   Received from [facility] - Occupational Stress Questionnaire    Feeling of Stress : To some extent  Social Connections: Unknown (01/15/2022)   Received from [facility]   Social Network    Social Network: Not on file  Intimate Partner Violence: Not At Risk (12/19/2022)   Received from [facility]   HITS    Over the last 12 months how often did your partner physically hurt you?: Never    Over the last 12 months how often did your partner insult you or talk down to you?: Never    Over the last 12 months how often did your partner threaten you with physical harm?: Never    Over the last 12 months how often did your partner scream or curse at you?: Never    FAMILY HISTORY: Family History  Problem Relation Age of Onset   Coronary artery disease Maternal Grandfather    Cancer Maternal Grandfather    Coronary artery disease Maternal Grandmother    Cancer Maternal Grandmother    Coronary artery disease Paternal Grandfather    Cancer Paternal Grandfather    Coronary artery disease Paternal Grandmother    Cancer Paternal Grandmother    Diabetes Cousin    Alcohol abuse Neg Hx    Anxiety disorder Neg Hx    Bipolar disorder Neg Hx    Depression Neg Hx    Drug abuse Neg Hx    OCD Neg Hx    Schizophrenia Neg Hx    ADD / ADHD Neg Hx     ALLERGIES:  is allergic to banana and latex.  MEDICATIONS:  Current Outpatient Medications  Medication Sig Dispense Refill   atorvastatin  (LIPITOR) 40 MG tablet Take 1 tablet (40 mg total) by mouth daily. 90 tablet 3   b complex vitamins capsule Take 1 capsule by mouth daily.     buPROPion  ER (WELLBUTRIN  SR) 100 MG 12 hr  tablet Take 1 tablet (100 mg total) by mouth daily. 30 tablet 2   estradiol (VIVELLE-DOT) 0.075 MG/24HR APPLY 1 PATCH TWICE A WEEK     famotidine (PEPCID) 20 MG tablet Take 20 mg by mouth in the morning and at bedtime.     fexofenadine (ALLEGRA) 180 MG tablet Take 180 mg by mouth daily.     fluticasone (FLONASE) 50 MCG/ACT nasal spray Place 2 sprays into the nose.     hydrOXYzine  (ATARAX ) 10 MG tablet Take 1 tablet (10 mg total) by mouth daily as needed for anxiety.     Magnesium Gluconate  550 MG TABS Take by mouth.     omeprazole (PRILOSEC) 20 MG capsule Take 20 mg by mouth in the morning.     progesterone (PROMETRIUM) 100 MG capsule Take 100 mg by mouth daily.  1   propranolol ER (INDERAL LA) 60 MG 24 hr capsule Take 60 mg by mouth at bedtime.     venlafaxine  XR (EFFEXOR -XR) 150 MG 24 hr capsule Take 2 capsules (300 mg total) by mouth daily with breakfast. 60 capsule 2   No current facility-administered medications for this visit.    REVIEW OF SYSTEMS:   Constitutional: Denies fevers, chills or abnormal night sweats  All other systems were reviewed with the patient and are negative.  PHYSICAL EXAMINATION: ECOG PERFORMANCE STATUS: 1 - Symptomatic but completely ambulatory  Vitals:   03/14/24 1236  BP: 126/70  Pulse: 64  Resp: 16  Temp: 97.8 F (36.6 C)   Filed Weights   03/14/24 1236  Weight: 14 lb 8 oz (6.577 kg)    GENERAL:alert, no distress and comfortable    LABORATORY DATA:  I have reviewed the data as listed Lab Results  Component Value Date   WBC 9.9 03/14/2024   HGB 12.4 03/14/2024   HCT 38.0 03/14/2024   MCV 91.8 03/14/2024   PLT 259 03/14/2024   Lab Results  Component Value Date   NA 141 03/14/2024   K 4.0 03/14/2024   CL 106 03/14/2024   CO2 28 03/14/2024    RADIOGRAPHIC STUDIES: I have personally reviewed the radiological reports and agreed with the findings in the report.  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 It is considered a precancerous lesion. DCIS is classified as a 0. It is generally detected through mammograms as calcifications. We discussed the significance of grades and its impact on prognosis. We also discussed the importance of ER and PR receptors and their implications to adjuvant treatment options. Prognosis of DCIS dependence on grade, comedo necrosis. It is anticipated that if not treated, 20-30% of DCIS can develop into invasive breast cancer.  Recommendation: 1. Breast conserving surgery 2. Followed by adjuvant radiation therapy 3. Followed by antiestrogen therapy with tamoxifen 5 years  Tamoxifen counseling: We discussed the risks and benefits of tamoxifen. These include but not limited to insomnia, hot flashes, mood changes, vaginal dryness, and weight gain. Although rare, serious side effects including endometrial cancer, risk of blood clots were also discussed. We strongly believe that the benefits far outweigh the risks. Patient understands these risks and consented to starting treatment. Planned treatment duration is 5 years.  Return to clinic after radiation for follow-up   All questions were answered. The patient knows to call the clinic with any problems, questions or concerns.    Viinay K Lashanna Angelo, MD 03/14/24

## 2024-03-15 ENCOUNTER — Telehealth: Payer: Self-pay | Admitting: Genetic Counselor

## 2024-03-15 ENCOUNTER — Encounter: Payer: Self-pay | Admitting: Genetic Counselor

## 2024-03-15 DIAGNOSIS — Z808 Family history of malignant neoplasm of other organs or systems: Secondary | ICD-10-CM | POA: Insufficient documentation

## 2024-03-15 DIAGNOSIS — C439 Malignant melanoma of skin, unspecified: Secondary | ICD-10-CM | POA: Insufficient documentation

## 2024-03-15 DIAGNOSIS — Z86006 Personal history of melanoma in-situ: Secondary | ICD-10-CM | POA: Insufficient documentation

## 2024-03-15 NOTE — Progress Notes (Signed)
 REFERRING PROVIDER: Odean Potts, MD [address] [place],  [place] [address]  PRIMARY PROVIDER:  Mannie Olam Shiver, PA  PRIMARY REASON FOR VISIT:  1. Family history of melanoma   2. Ductal carcinoma in situ (DCIS) of right breast   3. Melanoma of skin (HCC)      HISTORY OF PRESENT ILLNESS:   Mallory [name], a 68 y.o. female, was seen for a [facility] cancer genetics consultation at the request of Dr. Odean due to a personal and family history of cancer.  Mallory [name] presents to clinic today to discuss the possibility of a hereditary predisposition to cancer, genetic testing, and to further clarify her future cancer risks, as well as potential cancer risks for family members.   In June 2025, at the age of 39, Mallory [name] was diagnosed with DCIS of the right breast.  About 5 years ago she had been diagnosed with melanoma.      CANCER HISTORY:  Oncology History  Ductal carcinoma in situ (DCIS) of right breast  03/06/2024 Initial Diagnosis   Palpable right breast mass for 2 weeks, ultrasound and mammogram revealed 0.8 cm mass which on biopsy came back intermediate grade DCIS with an intraductal papilloma, ER 100%, PR 70%   03/14/2024 Cancer Staging   Staging form: Breast, AJCC 8th Edition - Clinical stage from 03/14/2024: Stage 0 (cTis (DCIS), cN0, cM0, ER+, PR+, HER2: Not Assessed) - Signed by Odean Potts, MD on 03/14/2024 Stage prefix: Initial diagnosis Nuclear grade: G2 Laterality: Right Staged by: Pathologist and managing physician Stage used in treatment planning: Yes National guidelines used in treatment planning: Yes Type of national guideline used in treatment planning: NCCN      Past Medical History:  Diagnosis Date   Anxiety    Family history of melanoma    GAD (generalized anxiety disorder)    GERD (gastroesophageal reflux disease)    Headache    Hx of cardiovascular stress test    ETT-Myoview (03/2014):  EF 58%, no ischemia; normal   Peri-menopause     Personal history of melanoma in-situ     Past Surgical History:  Procedure Laterality Date   CESAREAN SECTION     DILITATION & CURRETTAGE/HYSTROSCOPY WITH ESSURE      Social History   Socioeconomic History   Marital status: Married    Spouse name: Not on file   Number of children: Not on file   Years of education: Not on file   Highest education level: Not on file  Occupational History   Not on file  Tobacco Use   Smoking status: Every Day    Current packs/day: 0.50    Average packs/day: 0.5 packs/day for 39.0 years (19.5 ttl pk-yrs)    Types: Cigarettes   Smokeless tobacco: Never   Tobacco comments:    Working to quit currently as she is cutting back and wants to quit.  Vaping Use   Vaping status: Never Used  Substance and Sexual Activity   Alcohol use: No    Alcohol/week: 0.0 standard drinks of alcohol   Drug use: No   Sexual activity: Never    Partners: Male  Other Topics Concern   Not on file  Social History Narrative   Not on file   Social Drivers of Health   Financial Resource Strain: Low Risk  (11/16/2023)   Received from [facility]   Overall Financial Resource Strain (CARDIA)    Difficulty of Paying Living Expenses: Not hard at all  Food Insecurity: No  Food Insecurity (11/16/2023)   Received from [facility]   Hunger Vital Sign    Within the past 12 months, you worried that your food would run out before you got the money to buy more.: Never true    Within the past 12 months, the food you bought just didn't last and you didn't have money to get more.: Never true  Transportation Needs: No Transportation Needs (11/16/2023)   Received from [facility]   PRAPARE - Transportation    Lack of Transportation (Medical): No    Lack of Transportation (Non-Medical): No  Physical Activity: Inactive (04/16/2021)   Received from [facility]   Exercise Vital Sign    On average, how many days per week do you engage in moderate to strenuous exercise (like a brisk  walk)?: 0 days    On average, how many minutes do you engage in exercise at this level?: 0 min  Stress: Stress Concern Present (04/16/2021)   Received from [facility] - Occupational Stress Questionnaire    Feeling of Stress : To some extent  Social Connections: Unknown (01/15/2022)   Received from [facility]   Social Network    Social Network: Not on file     FAMILY HISTORY:  We obtained a detailed, 4-generation family history.  Significant diagnoses are listed below: Family History  Problem Relation Age of Onset   Melanoma Mother    Coronary artery disease Maternal Grandmother    Cancer Maternal Grandmother    Coronary artery disease Maternal Grandfather    Cancer Maternal Grandfather    Coronary artery disease Paternal Grandmother    Cancer Paternal Grandmother    Coronary artery disease Paternal Grandfather    Cancer Paternal Grandfather    Melanoma Daughter 73   Diabetes Cousin    Alcohol abuse Neg Hx    Anxiety disorder Neg Hx    Bipolar disorder Neg Hx    Depression Neg Hx    Drug abuse Neg Hx    OCD Neg Hx    Schizophrenia Neg Hx    ADD / ADHD Neg Hx      The patient has a daughter who had melanoma at 63.  Her brother is cancer free, and her mother had melanoma.  Her mother's paternal cousins had cancer of different types.  Mallory [name] is unaware of previous family history of genetic testing for hereditary cancer risks. There is no reported Ashkenazi Jewish ancestry. There is no known consanguinity.  GENETIC COUNSELING ASSESSMENT: Mallory [name] is a 68 y.o. female with a personal and family history of cancer which is somewhat suggestive of a hereditary cancer syndrome and predisposition to cancer given the number of cases of melanoma and the young ages of onset. We, therefore, discussed and recommended the following at today's visit.   DISCUSSION: We discussed that, in general, most cancer is not inherited in families, but  instead is sporadic or familial. Sporadic cancers occur by chance and typically happen at older ages (>50 years) as this type of cancer is caused by genetic changes acquired during an individual's lifetime. Some families have more cancers than would be expected by chance; however, the ages or types of cancer are not consistent with a known genetic mutation or known genetic mutations have been ruled out. This type of familial cancer is thought to be due to a combination of multiple genetic, environmental, hormonal, and lifestyle factors. While this combination of factors likely increases the risk of  cancer, the exact source of this risk is not currently identifiable or testable.  We discussed that 5 - 10% of melanoma is hereditary, with most cases associated with CDKN2A mutations.  There are other genes that can be associated with hereditary melanoma cancer syndromes.  These include BAP1, POT1 and CDK4.  We discussed that testing is beneficial for several reasons including knowing how to follow individuals after completing their treatment, identifying whether potential treatment options such as PARP inhibitors would be beneficial, and understand if other family members could be at risk for cancer and allow them to undergo genetic testing.   We reviewed the characteristics, features and inheritance patterns of hereditary cancer syndromes. We also discussed genetic testing, including the appropriate family members to test, the process of testing, insurance coverage and turn-around-time for results. We discussed the implications of a negative, positive, carrier and/or variant of uncertain significant result. Mallory [name]  was offered a common hereditary cancer panel (36+ genes) and an expanded pan-cancer panel (70+ genes). Mallory [name] was informed of the benefits and limitations of each panel, including that expanded pan-cancer panels contain genes that do not have clear management guidelines at this point in time.  We  also discussed that as the number of genes included on a panel increases, the chances of variants of uncertain significance increases. Mallory [name] decided to pursue genetic testing for the CancerNext-Expanded+RNAinsight gene panel.   The CancerNext-Expanded gene panel offered by [facility] and includes sequencing, rearrangement, and RNA analysis for the following 77 genes: AIP, ALK, APC, ATM, BAP1, BARD1, BMPR1A, BRCA1, BRCA2, BRIP1, CDC73, CDH1, CDK4, CDKN1B, CDKN2A, CEBPA, CHEK2, CTNNA1, DDX41, DICER1, ETV6, FH, FLCN, GATA2, LZTR1, MAX, MBD4, MEN1, MET, MLH1, MSH2, MSH3, MSH6, MUTYH, NF1, NF2, NTHL1, PALB2, PHOX2B, PMS2, POT1, PRKAR1A, PTCH1, PTEN, RAD51C, RAD51D, RB1, RET, RPS20, RUNX1, SDHA, SDHAF2, SDHB, SDHC, SDHD, SMAD4, SMARCA4, SMARCB1, SMARCE1, STK11, SUFU, TMEM127, TP53, TSC1, TSC2, VHL, and WT1 (sequencing and deletion/duplication); AXIN2, CTNNA1, DDX41, EGFR, HOXB13, KIT, MBD4, MITF, MSH3, PDGFRA, POLD1 and POLE (sequencing only); EPCAM and GREM1 (deletion/duplication only). RNA data is routinely analyzed for use in variant interpretation for all genes.   Based on Mallory [name] personal and family history of cancer, she meets medical criteria for genetic testing. Despite that she meets criteria, she may still have an out of pocket cost. We discussed that if her out of pocket cost for testing is over $100, the laboratory will call and confirm whether she wants to proceed with testing.  If the out of pocket cost of testing is less than $100 she will be billed by the genetic testing laboratory.   PLAN: After considering the risks, benefits, and limitations, Mallory [name] provided informed consent to pursue genetic testing and the blood sample was sent to [facility] for analysis of the CancerNext-Expanded+RNAinsight. Results should be available within approximately 2-3 weeks' time, at which point they will be disclosed by telephone to Mallory [name], as will any additional recommendations  warranted by these results. Mallory [name] will receive a summary of her genetic counseling visit and a copy of her results once available. This information will also be available in Epic.   Lastly, we encouraged Mallory [name] to remain in contact with cancer genetics annually so that we can continuously update the family history and inform her of any changes in cancer genetics and testing that may be of benefit for this family.   Mallory [name] questions were answered to her satisfaction today. Our contact information was provided should  additional questions or concerns arise. Thank you for the referral and allowing us  to share in the care of your patient.   Jamarkis Branam P. Perri, MS, CGC Licensed, Patent attorney [email or web address] phone: [phone]  In total, 40 minutes were spent on the date of the encounter in service to the patient including preparation, face-to-face consultation, documentation and care coordination.  The patient was seen alone.  Drs. Lanny Stalls, and/or Gudena were available for questions, if needed..    _______________________________________________________________________ For Office Staff:  Number of people involved in session: 1 Was an Intern/ student involved with case: no

## 2024-03-15 NOTE — Telephone Encounter (Signed)
 Mallory [name] was seen by a genetic counselor during the breast multidisciplinary clinic on March 14, 2024. In addition to her personal history of breast cancer, she reported a personal history of melanoma and a family history of melanoma. She does not meet NCCN criteria for genetic testing at this time. However, she thinks there may be a third case of melanoma in the family.  Mallory [name] will call some family members and let me know if there are additional family members with cancer.  She was still offered genetic testing but declined until additional family history was obtained. We encourage her to contact us  if there are any changes to her personal or family history of cancer. If she meets NCCN criteria based on the updated personal/family history, she would be recommended to have genetic counseling and testing.

## 2024-03-20 ENCOUNTER — Other Ambulatory Visit: Payer: Self-pay

## 2024-03-20 ENCOUNTER — Encounter (HOSPITAL_BASED_OUTPATIENT_CLINIC_OR_DEPARTMENT_OTHER): Payer: Self-pay | Admitting: General Surgery

## 2024-03-21 ENCOUNTER — Other Ambulatory Visit: Payer: Self-pay | Admitting: *Deleted

## 2024-03-21 DIAGNOSIS — D0511 Intraductal carcinoma in situ of right breast: Secondary | ICD-10-CM

## 2024-03-22 ENCOUNTER — Telehealth: Payer: Self-pay | Admitting: *Deleted

## 2024-03-22 ENCOUNTER — Encounter: Payer: Self-pay | Admitting: *Deleted

## 2024-03-22 NOTE — Telephone Encounter (Signed)
 Spoke with patient to follow up from [facility] and assess navigation needs. Patient denies any questions or concerns at this time. Encouraged her to call should anything arise.

## 2024-03-26 ENCOUNTER — Encounter: Payer: Self-pay | Admitting: General Practice

## 2024-03-26 MED ORDER — CHLORHEXIDINE GLUCONATE CLOTH 2 % EX PADS: 6.0000 | MEDICATED_PAD | Freq: Once | CUTANEOUS | Status: DC

## 2024-03-26 NOTE — Progress Notes (Signed)
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
## 2024-03-26 NOTE — H&P (Signed)
 REFERRING PHYSICIAN:  Vonzell     PROVIDER:  JINA CLAIR NEPHEW, MD   Care Team: Patient Care Team: Lelon Rollene Dragon, DO as PCP - General (Internal Medicine) Curlene Lynwood BRAVO, MD as Referring Physician (Obstetrics and Gynecology) Edna Toribio LABOR, MD as Referring Physician (Orthopedic Surgery) Odean Mackey POUR, MD as Referring Physician (Hematology and Oncology) Shannon Lynwood BIRCH, MD as Referring Physician (Radiation Oncology) NEPHEW JINA CLAIR, MD as Consulting Provider (Surgical Oncology) Berry, Jonathan [place], MD as Referring Physician (Cardiovascular Disease)    MRN: [id number] DOB: [birth year] DATE OF ENCOUNTER: 03/14/2024   Subjective    Chief Complaint: New Consultation (Breast cancer)       History of Present Illness: Mallory [name] is a 68 y.o. female who is seen today as an office consultation at the request of Dr. Gudena for evaluation of New Consultation (Breast cancer)     Patient presents with a new diagnosis of right breast cancer June 2025.  The patient had a palpable mass.  She subsequently underwent diagnostic imaging.  This demonstrated 8 mm mass in the lower outer quadrant on the right.  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  This is ER and PR positive.  The patient has no personal or family history of breast cancer, but does have a history of melanoma in herself and her mother.   Patient lives with her goldendoodle.   Patient's recent health has also been plagued by vaginal bleeding which is being worked up by Dr. Tomblin.  Preliminary workup seems to indicate this is a fibroid.  She also has severe hip arthritis and is working toward a hip replacement.  She is going to be getting an injection next week as she is going to have to delay this for treatment of her breast cancer.     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    [facility] 02/29/[facility]    [facility] 02/29/[facility]      Pathology core needle biopsy: 03/06/24    Receptors:      Review of Systems: A complete review of systems was obtained from the patient.  I have reviewed this information and discussed as appropriate with the patient.  See HPI as well for other ROS. ROS - severe hip pain, vaginal bleeding, see above.        Medical History: Past Medical History      Past Medical History:  Diagnosis Date   Anxiety     Arthritis     GERD (gastroesophageal reflux disease)     History of cancer     Hyperlipidemia     Thyroid disease          Problem List     Patient Active Problem List  Diagnosis   Malignant neoplasm of lower-outer quadrant of right breast of female, estrogen receptor positive (CMS/HHS-HCC)   Acquired asplenia   Arthritis   History of melanoma   Hyperlipidemia   Multinodular goiter (nontoxic)        Past Surgical History       Past Surgical History:  Procedure Laterality Date   CESAREAN SECTION       DILATION & CURETTAGE CERVIX       EXPLORATORY LAPAROTOMY       HERNIA REPAIR       SPLENECTOMY  Allergies       Allergies  Allergen Reactions   Banana Itching   Latex Itching, Other (See Comments) and Rash      Tingling all over.        Medications Ordered Prior to Encounter        Current Outpatient Medications on File Prior to Visit  Medication Sig Dispense Refill   atorvastatin  (LIPITOR) 40 MG tablet Take 40 mg by mouth once daily       buPROPion  (WELLBUTRIN  SR) 100 MG SR tablet Take 100 mg by mouth once daily       hydrOXYzine  HCL (ATARAX ) 10 MG tablet Take 10 mg by mouth 2 (two) times daily as needed       magnesium gluconate 30 mg (550 mg) Tab Take 30 mg by mouth once daily       omeprazole (PRILOSEC) 20 MG DR capsule Take 20 mg by mouth once daily       progesterone (PROMETRIUM) 100 MG capsule Take  100 mg by mouth once daily       propranoloL (INDERAL LA) 60 MG LA capsule Take 60 mg by mouth once daily       venlafaxine  (EFFEXOR -XR) 150 MG XR capsule TAKE 2 CAPSULES BY MOUTH ONCE DAILY WITH BREAKFAST       estradioL (CLIMARA) 0.075 mg/24 hr patch 1 PATCH TWICE WEEKLY       famotidine (PEPCID) 20 MG tablet Take 20 mg by mouth 2 (two) times daily       fexofenadine (ALLEGRA) 180 MG tablet Take 180 mg by mouth        No current facility-administered medications on file prior to visit.        Family History       Family History  Problem Relation Age of Onset   Skin cancer Mother          Tobacco Use History  Social History        Tobacco Use  Smoking Status Every Day   Current packs/day: 0.50   Types: Cigarettes  Smokeless Tobacco Not on file        Social History  Social History         Socioeconomic History   Marital status: Married  Tobacco Use   Smoking status: Every Day      Current packs/day: 0.50      Types: Cigarettes  Vaping Use   Vaping status: Never Used  Substance and Sexual Activity   Drug use: Never    Social Drivers of Acupuncturist Strain: Low Risk  (11/16/2023)    Received from [facility]    Overall Financial Resource Strain (CARDIA)     Difficulty of Paying Living Expenses: Not hard at all  Food Insecurity: No Food Insecurity (11/16/2023)    Received from [facility]    Hunger Vital Sign     Within the past 12 months, you worried that your food would run out before you got the money to buy more.: Never true     Within the past 12 months, the food you bought just didn't last and you didn't have money to get more.: Never true  Transportation Needs: No Transportation Needs (11/16/2023)    Received from [facility] - Transportation     Lack of Transportation (Medical): No     Lack of Transportation (Non-Medical): No  Physical Activity: Inactive (04/16/2021)    Received from  [facility]    Exercise Vital  Sign     On average, how many days per week do you engage in moderate to strenuous exercise (like a brisk walk)?: 0 days     On average, how many minutes do you engage in exercise at this level?: 0 min  Stress: Stress Concern Present (04/16/2021)    Received from [facility] Occupational Health - Occupational Stress Questionnaire     Feeling of Stress : To some extent  Housing Stability: Low Risk  (11/16/2023)    Received from [facility] Stability Vital Sign     In the last 12 months, was there a time when you were not able to pay the mortgage or rent on time?: No     In the past 12 months, how many times have you moved where you were living?: 1     At any time in the past 12 months, were you homeless or living in a shelter (including now)?: No        Objective:         Vitals:    03/14/24 1711  BP: 126/70  Pulse: 64  Resp: 16  Temp: 36.6 C (97.8 F)  Weight: 65.8 kg (145 lb)  Height: 157.5 cm (5' 2)    Body mass index is 26.52 kg/m.   Gen:  No acute distress.  Well nourished and well groomed.   Neurological: Alert and oriented to person, place, and time. Coordination normal.  Head: Normocephalic and atraumatic.  Eyes: Conjunctivae are normal. Pupils are equal, round, and reactive to light. No scleral icterus.  Neck: Normal range of motion. Neck supple. No tracheal deviation. + thyromegaly.   Cardiovascular: Normal rate, regular rhythm, normal heart sounds and intact distal pulses.  Exam reveals no gallop and no friction rub.  No murmur heard. Breast: There is a small 5 to 10 mm palpable mass on the inferolateral right breast.  There is a small scar at the end inferior areolar border on the right breast.  This is well-healed.  Right breast slightly smaller than left.  Mild ptosis bilaterally.  Denser than would be expected for her age.  No axillary adenopathy.  No nipple retraction or nipple discharge.  No contour  abnormalities. Respiratory: Effort normal.  No respiratory distress. No chest wall tenderness. Breath sounds normal.  No wheezes, rales or rhonchi.  GI: Soft. Bowel sounds are normal. The abdomen is soft and nontender.  There is no rebound and no guarding.  Musculoskeletal: Normal range of motion. Extremities are nontender.  Lymphadenopathy: No cervical, preauricular, postauricular or axillary adenopathy is present Skin: Skin is warm and dry. No rash noted. No diaphoresis. No erythema. No pallor. No clubbing, cyanosis, or edema.   Psychiatric: Normal mood and affect. Behavior is normal. Judgment and thought content normal.      Labs CBC, CMET essentially normal   Assessment and Plan:        ICD-10-CM    1. Malignant neoplasm of lower-outer quadrant of right breast of female, estrogen receptor positive (CMS/HHS-HCC)  C50.511      Z17.0       2. Acquired asplenia  Z90.81       3. History of melanoma  Z85.820         Patient has a new diagnosis of stage 0 right breast cancer.  This is amenable to breast conservation.  Seed localized lumpectomy will be followed by radiation  and antihormonal treatment.  I discussed surgery with the patient.  The patient is being referred for genetic testing if desired.   The surgical procedure was described to the patient.  I discussed the incision type and location and that we would need radiology involved pre op to place a seed 1-2 days pre op.      We discussed the risks bleeding, infection, damage to other structures, need for further procedures/surgeries.  We discussed the risk of seroma.  The patient was advised that we may need to go back to surgery for additional tissue to obtain negative margins or for a lymph node biopsy. The patient was advised that these are the most common complications, but that others can occur as well. I discussed the risk of alteration in breast contour or size.  I discussed risk of chronic pain.  There are rare instances of  heart/lung issues post op as well as blood clots.      They were advised against taking aspirin or other anti-inflammatory agents/blood thinners the week before surgery.     The risks and benefits of the procedure were described to the patient and she wishes to proceed.

## 2024-03-27 ENCOUNTER — Ambulatory Visit (HOSPITAL_BASED_OUTPATIENT_CLINIC_OR_DEPARTMENT_OTHER): Payer: Medicare (Managed Care) | Admitting: Anesthesiology

## 2024-03-27 ENCOUNTER — Other Ambulatory Visit: Payer: Self-pay

## 2024-03-27 ENCOUNTER — Ambulatory Visit (HOSPITAL_BASED_OUTPATIENT_CLINIC_OR_DEPARTMENT_OTHER)
Admission: RE | Admit: 2024-03-27 | Discharge: 2024-03-27 | Disposition: A | Discharge status: Home / Self Care | Payer: Medicare (Managed Care) | Attending: General Surgery | Admitting: General Surgery

## 2024-03-27 ENCOUNTER — Encounter (HOSPITAL_BASED_OUTPATIENT_CLINIC_OR_DEPARTMENT_OTHER): Payer: Self-pay | Admitting: General Surgery

## 2024-03-27 ENCOUNTER — Encounter (HOSPITAL_BASED_OUTPATIENT_CLINIC_OR_DEPARTMENT_OTHER)
Admission: RE | Disposition: A | Discharge status: Home / Self Care | Payer: Self-pay | Source: Home / Self Care | Attending: General Surgery

## 2024-03-27 ENCOUNTER — Telehealth (HOSPITAL_COMMUNITY): Payer: Self-pay | Admitting: *Deleted

## 2024-03-27 DIAGNOSIS — Z85820 Personal history of malignant melanoma of skin: Secondary | ICD-10-CM | POA: Diagnosis not present

## 2024-03-27 DIAGNOSIS — Z01818 Encounter for other preprocedural examination: Secondary | ICD-10-CM

## 2024-03-27 DIAGNOSIS — Z79899 Other long term (current) drug therapy: Secondary | ICD-10-CM | POA: Diagnosis not present

## 2024-03-27 DIAGNOSIS — F411 Generalized anxiety disorder: Secondary | ICD-10-CM

## 2024-03-27 DIAGNOSIS — F17210 Nicotine dependence, cigarettes, uncomplicated: Secondary | ICD-10-CM | POA: Diagnosis not present

## 2024-03-27 DIAGNOSIS — C50511 Malignant neoplasm of lower-outer quadrant of right female breast: Secondary | ICD-10-CM

## 2024-03-27 DIAGNOSIS — Z630 Problems in relationship with spouse or partner: Secondary | ICD-10-CM

## 2024-03-27 DIAGNOSIS — I2510 Atherosclerotic heart disease of native coronary artery without angina pectoris: Secondary | ICD-10-CM | POA: Insufficient documentation

## 2024-03-27 DIAGNOSIS — Z170 Estrogen receptor positive status [ER+]: Secondary | ICD-10-CM | POA: Insufficient documentation

## 2024-03-27 DIAGNOSIS — Z1721 Progesterone receptor positive status: Secondary | ICD-10-CM | POA: Insufficient documentation

## 2024-03-27 DIAGNOSIS — K219 Gastro-esophageal reflux disease without esophagitis: Secondary | ICD-10-CM | POA: Insufficient documentation

## 2024-03-27 HISTORY — PX: BREAST LUMPECTOMY WITH RADIOACTIVE SEED LOCALIZATION: SHX6424

## 2024-03-27 HISTORY — DX: Unspecified osteoarthritis, unspecified site: M19.90

## 2024-03-27 SURGERY — BREAST LUMPECTOMY WITH RADIOACTIVE SEED LOCALIZATION
Anesthesia: General | Site: Breast | Laterality: Right

## 2024-03-27 MED ORDER — DEXMEDETOMIDINE HCL IN NACL 80 MCG/20ML IV SOLN
INTRAVENOUS | Status: DC | PRN
  Administered 2024-03-27: 8 ug via INTRAVENOUS

## 2024-03-27 MED ORDER — FENTANYL CITRATE (PF) 100 MCG/2ML IJ SOLN: 25.0000 ug | INTRAMUSCULAR | Status: DC | PRN

## 2024-03-27 MED ORDER — DEXAMETHASONE SODIUM PHOSPHATE 4 MG/ML IJ SOLN
INTRAMUSCULAR | Status: DC | PRN
  Administered 2024-03-27: 5 mg via INTRAVENOUS

## 2024-03-27 MED ORDER — PROPOFOL 500 MG/50ML IV EMUL
INTRAVENOUS | Status: DC | PRN
  Administered 2024-03-27: 125 ug/kg/min via INTRAVENOUS

## 2024-03-27 MED ORDER — LIDOCAINE HCL (CARDIAC) PF 100 MG/5ML IV SOSY
PREFILLED_SYRINGE | INTRAVENOUS | Status: DC | PRN
  Administered 2024-03-27: 50 mg via INTRAVENOUS

## 2024-03-27 MED ORDER — FENTANYL CITRATE (PF) 100 MCG/2ML IJ SOLN
INTRAMUSCULAR | Status: AC
  Filled 2024-03-27: qty 2

## 2024-03-27 MED ORDER — ACETAMINOPHEN 500 MG PO TABS
ORAL_TABLET | ORAL | Status: AC
  Filled 2024-03-27: qty 2

## 2024-03-27 MED ORDER — LACTATED RINGERS IV SOLN: INTRAVENOUS | Status: DC

## 2024-03-27 MED ORDER — ONDANSETRON HCL 4 MG/2ML IJ SOLN
INTRAMUSCULAR | Status: AC
  Filled 2024-03-27: qty 2

## 2024-03-27 MED ORDER — OXYCODONE HCL 5 MG/5ML PO SOLN: 5.0000 mg | Freq: Once | ORAL | Status: DC | PRN

## 2024-03-27 MED ORDER — CEFAZOLIN SODIUM-DEXTROSE 2-4 GM/100ML-% IV SOLN
2.0000 g | INTRAVENOUS | Status: AC
  Administered 2024-03-27: 2 g via INTRAVENOUS

## 2024-03-27 MED ORDER — FENTANYL CITRATE (PF) 100 MCG/2ML IJ SOLN
INTRAMUSCULAR | Status: DC | PRN
  Administered 2024-03-27 (×2): 50 ug via INTRAVENOUS
  Administered 2024-03-27: 25 ug via INTRAVENOUS

## 2024-03-27 MED ORDER — FENTANYL CITRATE (PF) 100 MCG/2ML IJ SOLN
INTRAMUSCULAR | Status: AC
Start: 2024-03-27 — End: 2024-03-27
  Filled 2024-03-27: qty 2

## 2024-03-27 MED ORDER — DEXAMETHASONE SODIUM PHOSPHATE 10 MG/ML IJ SOLN
INTRAMUSCULAR | Status: AC
  Filled 2024-03-27: qty 1

## 2024-03-27 MED ORDER — LIDOCAINE 2% (20 MG/ML) 5 ML SYRINGE
INTRAMUSCULAR | Status: AC
  Filled 2024-03-27: qty 5

## 2024-03-27 MED ORDER — PROPOFOL 10 MG/ML IV BOLUS
INTRAVENOUS | Status: DC | PRN
  Administered 2024-03-27: 150 mg via INTRAVENOUS
  Administered 2024-03-27: 25 mg via INTRAVENOUS

## 2024-03-27 MED ORDER — EPHEDRINE SULFATE (PRESSORS) 50 MG/ML IJ SOLN
INTRAMUSCULAR | Status: DC | PRN
  Administered 2024-03-27: 5 mg via INTRAVENOUS

## 2024-03-27 MED ORDER — PROPOFOL 10 MG/ML IV BOLUS
INTRAVENOUS | Status: AC
  Filled 2024-03-27: qty 20

## 2024-03-27 MED ORDER — ONDANSETRON HCL 4 MG/2ML IJ SOLN: 4.0000 mg | Freq: Once | INTRAMUSCULAR | Status: DC | PRN

## 2024-03-27 MED ORDER — MIDAZOLAM HCL 5 MG/5ML IJ SOLN
INTRAMUSCULAR | Status: DC | PRN
  Administered 2024-03-27: 1 mg via INTRAVENOUS

## 2024-03-27 MED ORDER — CEFAZOLIN SODIUM-DEXTROSE 2-4 GM/100ML-% IV SOLN
INTRAVENOUS | Status: AC
  Filled 2024-03-27: qty 100

## 2024-03-27 MED ORDER — VENLAFAXINE HCL ER 150 MG PO CP24: 300.0000 mg | ORAL_CAPSULE | Freq: Every day | ORAL | 0 refills | Status: DC

## 2024-03-27 MED ORDER — LIDOCAINE-EPINEPHRINE (PF) 1 %-1:200000 IJ SOLN
INTRAMUSCULAR | Status: DC | PRN
  Administered 2024-03-27: 60 mL via INTRAMUSCULAR

## 2024-03-27 MED ORDER — KETOROLAC TROMETHAMINE 30 MG/ML IJ SOLN
INTRAMUSCULAR | Status: AC
  Filled 2024-03-27: qty 1

## 2024-03-27 MED ORDER — OXYCODONE HCL 5 MG PO TABS: 5.0000 mg | ORAL_TABLET | Freq: Four times a day (QID) | ORAL | 0 refills | Status: DC | PRN

## 2024-03-27 MED ORDER — PHENYLEPHRINE HCL (PRESSORS) 10 MG/ML IV SOLN
INTRAVENOUS | Status: DC | PRN
  Administered 2024-03-27 (×4): 80 ug via INTRAVENOUS

## 2024-03-27 MED ORDER — ACETAMINOPHEN 500 MG PO TABS
1000.0000 mg | ORAL_TABLET | ORAL | Status: AC
  Administered 2024-03-27: 1000 mg via ORAL

## 2024-03-27 MED ORDER — OXYCODONE HCL 5 MG PO TABS: 5.0000 mg | ORAL_TABLET | Freq: Once | ORAL | Status: DC | PRN

## 2024-03-27 MED ORDER — DROPERIDOL 2.5 MG/ML IJ SOLN: 0.6250 mg | Freq: Once | INTRAMUSCULAR | Status: DC | PRN

## 2024-03-27 MED ORDER — MIDAZOLAM HCL 2 MG/2ML IJ SOLN
INTRAMUSCULAR | Status: AC
  Filled 2024-03-27: qty 2

## 2024-03-27 MED ORDER — ONDANSETRON HCL 4 MG/2ML IJ SOLN
INTRAMUSCULAR | Status: DC | PRN
Start: 2024-03-27 — End: 2024-03-27
  Administered 2024-03-27: 4 mg via INTRAVENOUS

## 2024-03-27 MED ORDER — BUPROPION HCL ER (SR) 100 MG PO TB12: 100.0000 mg | ORAL_TABLET | Freq: Every day | ORAL | 0 refills | Status: DC

## 2024-03-27 MED ORDER — KETOROLAC TROMETHAMINE 30 MG/ML IJ SOLN
INTRAMUSCULAR | Status: DC | PRN
  Administered 2024-03-27: 30 mg via INTRAVENOUS

## 2024-03-27 MED ORDER — 0.9 % SODIUM CHLORIDE (POUR BTL) OPTIME
TOPICAL | Status: DC | PRN
  Administered 2024-03-27: 100 mL

## 2024-03-27 SURGICAL SUPPLY — 47 items
BINDER BREAST LRG (GAUZE/BANDAGES/DRESSINGS) IMPLANT
BINDER BREAST MEDIUM (GAUZE/BANDAGES/DRESSINGS) IMPLANT
BINDER BREAST XLRG (GAUZE/BANDAGES/DRESSINGS) IMPLANT
BINDER BREAST XXLRG (GAUZE/BANDAGES/DRESSINGS) IMPLANT
BLADE SURG 10 STRL SS (BLADE) ×1 IMPLANT
BLADE SURG 15 STRL LF DISP TIS (BLADE) IMPLANT
CANISTER SUC SOCK COL 7IN (MISCELLANEOUS) IMPLANT
CANISTER SUCT 1200ML W/VALVE (MISCELLANEOUS) IMPLANT
CHLORAPREP W/TINT 26 (MISCELLANEOUS) ×1 IMPLANT
CLIP TI LARGE 6 (CLIP) ×1 IMPLANT
CLIP TI MEDIUM 6 (CLIP) IMPLANT
COVER BACK TABLE 60X90IN (DRAPES) ×1 IMPLANT
COVER MAYO STAND STRL (DRAPES) ×1 IMPLANT
COVER PROBE CYLINDRICAL 5X96 (MISCELLANEOUS) ×1 IMPLANT
DERMABOND ADVANCED .7 DNX12 (GAUZE/BANDAGES/DRESSINGS) ×1 IMPLANT
DRAPE LAPAROSCOPIC ABDOMINAL (DRAPES) ×1 IMPLANT
DRAPE UTILITY XL STRL (DRAPES) ×1 IMPLANT
ELECT COATED BLADE 2.86 ST (ELECTRODE) ×1 IMPLANT
ELECTRODE REM PT RTRN 9FT ADLT (ELECTROSURGICAL) ×1 IMPLANT
GAUZE PAD ABD 8X10 STRL (GAUZE/BANDAGES/DRESSINGS) IMPLANT
GAUZE SPONGE 4X4 12PLY STRL LF (GAUZE/BANDAGES/DRESSINGS) ×1 IMPLANT
GLOVE BIO SURGEON STRL SZ 6 (GLOVE) ×1 IMPLANT
GLOVE BIOGEL PI IND STRL 6.5 (GLOVE) ×1 IMPLANT
GLOVE SURG SS PI 6.0 STRL IVOR (GLOVE) IMPLANT
GLOVE SURG SS PI 6.5 STRL IVOR (GLOVE) IMPLANT
GOWN STRL REUS W/ TWL LRG LVL3 (GOWN DISPOSABLE) ×1 IMPLANT
GOWN STRL REUS W/ TWL XL LVL3 (GOWN DISPOSABLE) ×1 IMPLANT
KIT MARKER MARGIN INK (KITS) ×1 IMPLANT
LIGHT WAVEGUIDE WIDE FLAT (MISCELLANEOUS) IMPLANT
NDL HYPO 25X1 1.5 SAFETY (NEEDLE) ×1 IMPLANT
NEEDLE HYPO 25X1 1.5 SAFETY (NEEDLE) ×1 IMPLANT
NS IRRIG 1000ML POUR BTL (IV SOLUTION) ×1 IMPLANT
PACK BASIN DAY SURGERY FS (CUSTOM PROCEDURE TRAY) ×1 IMPLANT
PENCIL SMOKE EVACUATOR (MISCELLANEOUS) ×1 IMPLANT
SLEEVE SCD COMPRESS KNEE MED (STOCKING) ×1 IMPLANT
SPIKE FLUID TRANSFER (MISCELLANEOUS) IMPLANT
SPONGE T-LAP 18X18 ~~LOC~~+RFID (SPONGE) ×1 IMPLANT
STRIP CLOSURE SKIN 1/2X4 (GAUZE/BANDAGES/DRESSINGS) ×1 IMPLANT
SUT MNCRL AB 4-0 PS2 18 (SUTURE) ×1 IMPLANT
SUT SILK 2 0 SH (SUTURE) IMPLANT
SUT VIC AB 2-0 SH 27XBRD (SUTURE) ×1 IMPLANT
SUT VIC AB 3-0 SH 27X BRD (SUTURE) ×1 IMPLANT
SYR CONTROL 10ML LL (SYRINGE) ×1 IMPLANT
TOWEL GREEN STERILE FF (TOWEL DISPOSABLE) ×1 IMPLANT
TRAY FAXITRON CT DISP (TRAY / TRAY PROCEDURE) ×1 IMPLANT
TUBE CONNECTING 20X1/4 (TUBING) IMPLANT
YANKAUER SUCT BULB TIP NO VENT (SUCTIONS) IMPLANT

## 2024-03-27 NOTE — Op Note (Signed)
 Right Breast Magseed localized lumpectomy  Indications: This patient presents with history of right breast cancer, upper outer quadrant, cTis intermediate grade, receptors +/+  Pre-operative Diagnosis: right breast cancer, stage 0  Post-operative Diagnosis: Same  Surgeon: ARON SHOULDERS   Anesthesia: General endotracheal anesthesia  ASA Class: 2  Procedure Details  The patient was seen in the Holding Room. The risks, benefits, complications, treatment options, and expected outcomes were discussed with the patient. The possibilities of bleeding, infection, the need for additional procedures, failure to diagnose a condition, and creating a complication requiring other procedures or operations were discussed with the patient. The patient concurred with the proposed plan, giving informed consent.  The site of surgery properly noted/marked. The patient was taken to Operating Room # 1, identified, and the procedure verified as right breast seed localized lumpectomy.  The right breast and chest were prepped and draped in standard fashion. A transverse lateral incision was made near the previously placed radioactive seed.  Dissection was carried down around the point of maximum signal intensity. The cautery was used to perform the dissection.   The specimen was inked with the margin marker paint kit.    Specimen radiography confirmed inclusion of the mammographic lesion, the clip, and the seed.  The background signal in the breast was zero.  A 3D mammogram was obtained and additional margins were taken superiorly, medially, and anteriorly.  Hemostasis was achieved with cautery.  The cavity was marked with clips on each border other than the anterior border.  Local was administered into the surrounding tissue.  The wound was irrigated and closed with 3-0 vicryl interrupted deep dermal sutures and 4-0 monocryl running subcuticular suture.      Sterile dressings were applied. At the end of the operation, all  sponge, instrument, and needle counts were correct.   Findings: Seed, clip in specimen.  anterior margin is skin   Estimated Blood Loss:  min         Specimens: Right breast tissue with seed, additional superior margin, additional medial margin, additional anterior margin         Complications:  None; patient tolerated the procedure well.         Disposition: PACU - hemodynamically stable.         Condition: stable

## 2024-03-27 NOTE — Addendum Note (Signed)
 Addended by: Haille Pardi on: 03/27/2024 02:02 PM   Modules accepted: Orders

## 2024-03-27 NOTE — Discharge Instructions (Addendum)
 [facility] Office Phone Number [phone]  BREAST BIOPSY/ PARTIAL MASTECTOMY: POST OP INSTRUCTIONS  Always review your discharge instruction sheet given to you by the facility where your surgery was performed.  IF YOU HAVE DISABILITY OR FAMILY LEAVE FORMS, YOU MUST BRING THEM TO THE OFFICE FOR PROCESSING.  DO NOT GIVE THEM TO YOUR DOCTOR.  Take 2 tylenol  (acetominophen) three times a day for 3 days.  If you still have pain, add ibuprofen with food in between if able to take this (if you have kidney issues or stomach issues, do not take ibuprofen).  If both of those are not enough, add the narcotic pain pill.  If you find you are needing a lot of this overnight after surgery, call the next morning for a refill.    Prescriptions will not be filled after 5pm or on week-ends. Take your usually prescribed medications unless otherwise directed You should eat very light the first 24 hours after surgery, such as soup, crackers, pudding, etc.  Resume your normal diet the day after surgery. Most patients will experience some swelling and bruising in the breast.  Ice packs and a good support bra will help.  Swelling and bruising can take several days to resolve.  It is common to experience some constipation if taking pain medication after surgery.  Increasing fluid intake and taking a stool softener will usually help or prevent this problem from occurring.  A mild laxative (Milk of Magnesia or Miralax) should be taken according to package directions if there are no bowel movements after 48 hours. Unless discharge instructions indicate otherwise, you may remove your bandages 48 hours after surgery, and you may shower at that time.  You may have steri-strips (small skin tapes) in place directly over the incision.  These strips should be left on the skin at least for for 7-10 days.    ACTIVITIES:  You may resume regular daily activities (gradually increasing) beginning the next day.  Wearing a  good support bra or sports bra (or the breast binder) minimizes pain and swelling.  You may have sexual intercourse when it is comfortable. No heavy lifting for 1-2 weeks (not over around 10 pounds).  You may drive when you no longer are taking prescription pain medication, you can comfortably wear a seatbelt, and you can safely maneuver your car and apply brakes. RETURN TO WORK:  __________3-14 days depending on job. _______________ Rosine should see your doctor in the office for a follow-up appointment approximately two weeks after your surgery.  Your doctor's nurse will typically make your follow-up appointment when she calls you with your pathology report.  Expect your pathology report 3-4 business days after your surgery.  You may call to check if you do not hear from us  after three days.   WHEN TO CALL YOUR DOCTOR: Fever over 101.0 Nausea and/or vomiting. Extreme swelling or bruising. Continued bleeding from incision. Increased pain, redness, or drainage from the incision.  The clinic staff is available to answer your questions during regular business hours.  Please don't hesitate to call and ask to speak to one of the nurses for clinical concerns.  If you have a medical emergency, go to the nearest emergency room or call 911.  A surgeon from [facility] is always on call at the hospital.  For further questions, please visit [email or web address]    No Tylenol  before 2:15pm. No Ibuprofen/NSAIDS before 7:15pm   Post Anesthesia Home Care Instructions  Activity: Get plenty of rest for  the remainder of the day. A responsible individual must stay with you for 24 hours following the procedure.  For the next 24 hours, DO NOT: -Drive a car -Advertising copywriter -Drink alcoholic beverages -Take any medication unless instructed by your physician -Make any legal decisions or sign important papers.  Meals: Start with liquid foods such as gelatin or soup. Progress to regular  foods as tolerated. Avoid greasy, spicy, heavy foods. If nausea and/or vomiting occur, drink only clear liquids until the nausea and/or vomiting subsides. Call your physician if vomiting continues.  Special Instructions/Symptoms: Your throat may feel dry or sore from the anesthesia or the breathing tube placed in your throat during surgery. If this causes discomfort, gargle with warm salt water. The discomfort should disappear within 24 hours.  If you had a scopolamine patch placed behind your ear for the management of post- operative nausea and/or vomiting:  1. The medication in the patch is effective for 72 hours, after which it should be removed.  Wrap patch in a tissue and discard in the trash. Wash hands thoroughly with soap and water. 2. You may remove the patch earlier than 72 hours if you experience unpleasant side effects which may include dry mouth, dizziness or visual disturbances. 3. Avoid touching the patch. Wash your hands with soap and water after contact with the patch.

## 2024-03-27 NOTE — Interval H&P Note (Signed)
 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  The various methods of treatment have been discussed with the patient and family. After consideration of risks, benefits and other options for treatment, the patient has consented to  Procedure(s): BREAST LUMPECTOMY WITH RADIOACTIVE SEED LOCALIZATION (Right) as a surgical intervention.  The patient's history has been reviewed, patient examined, no change in status, stable for surgery.  I have reviewed the patient's chart and labs.  Questions were answered to the patient's satisfaction.     [name]

## 2024-03-27 NOTE — Anesthesia Procedure Notes (Signed)
 Procedure Name: LMA Insertion Date/Time: 03/27/2024 10:25 AM  Performed by: Buster Catheryn SAUNDERS, CRNAPre-anesthesia Checklist: Patient identified, Emergency Drugs available, Suction available and Patient being monitored Patient Re-evaluated:Patient Re-evaluated prior to induction Oxygen Delivery Method: Circle system utilized Preoxygenation: Pre-oxygenation with 100% oxygen Induction Type: IV induction Ventilation: Mask ventilation without difficulty LMA: LMA inserted LMA Size: 4.0 Number of attempts: 1 Airway Equipment and Method: Bite block Placement Confirmation: positive ETCO2 Tube secured with: Tape Dental Injury: Teeth and Oropharynx as per pre-operative assessment

## 2024-03-27 NOTE — Anesthesia Preprocedure Evaluation (Signed)
 Anesthesia Evaluation  Patient identified by MRN, date of birth, ID band Patient awake    Reviewed: Allergy & Precautions, NPO status , Patient's Chart, lab work & pertinent test results, reviewed documented beta blocker date and time   Airway Mallampati: II  TM Distance: >3 FB     Dental no notable dental hx. (+) Teeth Intact, Dental Advisory Given   Pulmonary Current Smoker and Patient abstained from smoking.   Pulmonary exam normal breath sounds clear to auscultation       Cardiovascular + CAD  Normal cardiovascular exam Rhythm:Regular Rate:Normal     Neuro/Psych  Headaches PSYCHIATRIC DISORDERS Anxiety        GI/Hepatic Neg liver ROS,GERD  Medicated,,  Endo/Other  Right breast Ca  Renal/GU negative Renal ROS  negative genitourinary   Musculoskeletal  (+) Arthritis , Osteoarthritis,    Abdominal   Peds  Hematology negative hematology ROS (+)   Anesthesia Other Findings   Reproductive/Obstetrics                              Anesthesia Physical Anesthesia Plan  ASA: 2  Anesthesia Plan: General   Post-op Pain Management: Minimal or no pain anticipated   Induction: Intravenous  PONV Risk Score and Plan: 3 and Treatment may vary due to age or medical condition  Airway Management Planned: LMA  Additional Equipment: None  Intra-op Plan:   Post-operative Plan: Extubation in OR  Informed Consent: I have reviewed the patients History and Physical, chart, labs and discussed the procedure including the risks, benefits and alternatives for the proposed anesthesia with the patient or authorized representative who has indicated his/her understanding and acceptance.     Dental advisory given  Plan Discussed with: Anesthesiologist and CRNA  Anesthesia Plan Comments:          Anesthesia Quick Evaluation

## 2024-03-27 NOTE — Anesthesia Postprocedure Evaluation (Signed)
 Anesthesia Post Note  Patient: Mallory [name]  Procedure(s) Performed: RIGHT BREAST SEED LOCALIZED LUMPECTOMY (Right: Breast)     Patient location during evaluation: PACU Anesthesia Type: General Level of consciousness: awake and alert and oriented Pain management: pain level controlled Vital Signs Assessment: post-procedure vital signs reviewed and stable Respiratory status: spontaneous breathing, nonlabored ventilation and respiratory function stable Cardiovascular status: blood pressure returned to baseline and stable Postop Assessment: no apparent nausea or vomiting Anesthetic complications: no   No notable events documented.  Last Vitals:  Vitals:   03/27/24 1200 03/27/24 1219  BP: 116/65 (!) 141/73  Pulse: 66 61  Resp: 12 16  Temp:  (!) 36.2 C  SpO2: 100% 97%    Last Pain:  Vitals:   03/27/24 1219  TempSrc:   PainSc: 0-No pain                 Berta Denson A.

## 2024-03-27 NOTE — Transfer of Care (Signed)
 Immediate Anesthesia Transfer of Care Note  Patient: Mallory [name]  Procedure(s) Performed: RIGHT BREAST SEED LOCALIZED LUMPECTOMY (Right: Breast)  Patient Location: PACU  Anesthesia Type:General  Level of Consciousness: awake, alert , and oriented  Airway & Oxygen Therapy: Patient Spontanous Breathing and Patient connected to face mask oxygen  Post-op Assessment: Report given to RN and Post -op Vital signs reviewed and stable  Post vital signs: Reviewed and stable  Last Vitals:  Vitals Value Taken Time  BP 126/73 03/27/24 11:46  Temp    Pulse 68 03/27/24 11:50  Resp 20 03/27/24 11:50  SpO2 100 % 03/27/24 11:50  Vitals shown include unfiled device data.  Last Pain:  Vitals:   03/27/24 0808  TempSrc: Temporal  PainSc: 5       Patients Stated Pain Goal: 3 (03/27/24 9191)  Complications: No notable events documented.

## 2024-03-27 NOTE — Telephone Encounter (Signed)
 PATIENT REQUEST WILL BE OUT BEFORE APPT 04/03/24  [facility] - [place], [place] - [address]   venlafaxine  XR (EFFEXOR -XR) 150 MG 24 hr capsule   buPROPion  ER (WELLBUTRIN  SR) 100 MG 12 hr tablet

## 2024-03-28 ENCOUNTER — Encounter (HOSPITAL_BASED_OUTPATIENT_CLINIC_OR_DEPARTMENT_OTHER): Payer: Self-pay | Admitting: General Surgery

## 2024-03-28 ENCOUNTER — Ambulatory Visit: Payer: Self-pay | Admitting: Genetic Counselor

## 2024-03-28 ENCOUNTER — Telehealth: Payer: Self-pay | Admitting: Genetic Counselor

## 2024-03-28 DIAGNOSIS — Z1379 Encounter for other screening for genetic and chromosomal anomalies: Secondary | ICD-10-CM | POA: Insufficient documentation

## 2024-03-28 NOTE — Telephone Encounter (Signed)
 I spoke to Mallory [name] to review results of genetic testing. she had genetic testing with [facility] CancerNext-Expanded +RNAinsight. 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 We discussed that we do not use VUS to make management decisions or identify at risk family members. Discussed that we do not know why she has cancer or why there is cancer in the family. It could be due to a different gene that we are not testing, or maybe our current technology may not be able to pick something up.  It will be important for her to keep in contact with genetics to keep up with whether additional testing may be needed. We will contact her if new information is learned about the VUS.   Please see counseling note for further detail on this result.

## 2024-03-28 NOTE — Progress Notes (Signed)
 HPI:  Ms. Ostrander was previously seen in the [facility] due to a personal and family history of cancer and concerns regarding a hereditary predisposition to cancer. Please refer to our prior cancer genetics clinic note for more information regarding our discussion, assessment and recommendations, at the time. Ms. Cancio recent genetic test results were disclosed to her, as were recommendations warranted by these results. These results and recommendations are discussed in more detail below.  Results were disclosed by phone on 03/28/24.   CANCER HISTORY:  Oncology History  Ductal carcinoma in situ (DCIS) of right breast  03/06/2024 Initial Diagnosis   Palpable right breast mass for 2 weeks, ultrasound and mammogram revealed 0.8 cm mass which on biopsy came back intermediate grade DCIS with an intraductal papilloma, ER 100%, PR 70%   03/14/2024 Cancer Staging   Staging form: Breast, AJCC 8th Edition - Clinical stage from 03/14/2024: Stage 0 (cTis (DCIS), cN0, cM0, ER+, PR+, HER2: Not Assessed) - Signed by Odean Potts, MD on 03/14/2024 Stage prefix: Initial diagnosis Nuclear grade: G2 Laterality: Right Staged by: Pathologist and managing physician Stage used in treatment planning: Yes National guidelines used in treatment planning: Yes Type of national guideline used in treatment planning: NCCN    Genetic Testing   Negative CancerNext-Expanded +RNAinsight panel. 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 The CancerNext-Expanded gene panel offered by [facility] and includes sequencing, rearrangement, and RNA analysis for the following 77 genes: AIP, ALK, APC, ATM, AXIN2, BAP1, BARD1, BMPR1A, BRCA1, BRCA2, BRIP1, CDC73, CDH1, CDK4, CDKN1B, CDKN2A, CEBPA, CHEK2, CTNNA1, DDX41, DICER1, EGFR, EPCAM, ETV6, FH, FLCN, GATA2, GREM1, HOXB13, KIT, LZTR1, MAX, MBD4, MEN1, MET, MITF, MLH1, MSH2, MSH3, MSH6, MUTYH, NF1, NF2, NTHL1, PALB2, PDGFRA, PHOX2B, PMS2, POLD1, POLE, POT1, PRKAR1A, PTCH1, PTEN, RAD51C, RAD51D,  RB1, RET, RPS20, RUNX1, SDHA, SDHAF2, SDHB, SDHC, SDHD, SMAD4, SMARCA4, SMARCB1, SMARCE1, STK11, SUFU, TMEM127, TP53, TSC1, TSC2, VHL, WT1. Report date 03/28/24.      FAMILY HISTORY:  We obtained a detailed, 4-generation family history.  Significant diagnoses are listed below: Family History  Problem Relation Age of Onset   Melanoma Mother    Coronary artery disease Maternal Grandmother    Cancer Maternal Grandmother    Coronary artery disease Maternal Grandfather    Cancer Maternal Grandfather    Coronary artery disease Paternal Grandmother    Cancer Paternal Grandmother    Coronary artery disease Paternal Grandfather    Cancer Paternal Grandfather    Melanoma Daughter 52   Diabetes Cousin    Alcohol abuse Neg Hx    Anxiety disorder Neg Hx    Bipolar disorder Neg Hx    Depression Neg Hx    Drug abuse Neg Hx    OCD Neg Hx    Schizophrenia Neg Hx    ADD / ADHD Neg Hx        The patient has a daughter who had melanoma at 72.  Her brother is cancer free, and her mother had melanoma.  Her mother's paternal cousins had cancer of different types.   Ms. Borba is unaware of previous family history of genetic testing for hereditary cancer risks. There is no reported Ashkenazi Jewish ancestry. There is no known consanguinity.  GENETIC TEST RESULTS: Genetic testing reported out on 03/28/24 through the [facility] CancerNext-Expanded +RNAinsight panel found no pathogenic mutations. The CancerNext-Expanded gene panel offered by [facility] and includes sequencing, rearrangement, and RNA analysis for the following 77 genes: AIP, ALK, APC, ATM, AXIN2, BAP1, BARD1, BMPR1A, BRCA1, BRCA2, BRIP1,  CDC73, CDH1, CDK4, CDKN1B, CDKN2A, CEBPA, CHEK2, CTNNA1, DDX41, DICER1, EGFR, EPCAM, ETV6, FH, FLCN, GATA2, GREM1, HOXB13, KIT, LZTR1, MAX, MBD4, MEN1, MET, MITF, MLH1, MSH2, MSH3, MSH6, MUTYH, NF1, NF2, NTHL1, PALB2, PDGFRA, PHOX2B, PMS2, POLD1, POLE, POT1, PRKAR1A, PTCH1, PTEN, RAD51C, RAD51D, RB1,  RET, RPS20, RUNX1, SDHA, SDHAF2, SDHB, SDHC, SDHD, SMAD4, SMARCA4, SMARCB1, SMARCE1, STK11, SUFU, TMEM127, TP53, TSC1, TSC2, VHL, WT1. The test report has been scanned into EPIC and is located under the Molecular Pathology section of the Results Review tab.  A portion of the result report is included below for reference.     We discussed with Ms. Limburg that because current genetic testing is not perfect, it is possible there may be a gene mutation in one of these genes that current testing cannot detect, but that chance is small.  We also discussed, that there could be another gene that has not yet been discovered, or that we have not yet tested, that is responsible for the cancer diagnoses in the family. It is also possible there is a hereditary cause for the cancer in the family that Ms. Sturdivant did not inherit and therefore was not identified in her testing.  Therefore, it is important to remain in touch with cancer genetics in the future so that we can continue to offer Ms. Momon the most up to date genetic testing.   Genetic testing did identify a variant of uncertain significance (VUS) was identified in the HOXB13 gene called p.D167N (c.499G>A).  At this time, it is unknown if this variant is associated with increased cancer risk or if this is a normal finding, but most variants such as this get reclassified to being inconsequential. It should not be used to make medical management decisions. With time, we suspect the lab will determine the significance of this variant, if any. If we do learn more about it, we will try to contact Ms. Marszalek to discuss it further. However, it is important to stay in touch with us  periodically and keep the address and phone number up to date.  ADDITIONAL GENETIC TESTING: We discussed with Ms. Diven that her genetic testing was fairly extensive.  If there are genes identified to increase cancer risk that can be analyzed in the future, we would be happy to discuss and  coordinate this testing at that time.    CANCER SCREENING RECOMMENDATIONS: Ms. Mallis test result is considered negative (normal).  This means that we have not identified a hereditary cause for her personal and family history of cancer at this time. Most cancers happen by chance and this negative test suggests that her personal and family history of cancer may fall into this category.    Possible reasons for Ms. Philley negative genetic test include:  1. There may be a gene mutation in one of these genes that current testing methods cannot detect but that chance is small.  2. There could be another gene that has not yet been discovered, or that we have not yet tested, that is responsible for the cancer diagnoses in the family.  3.  There may be no hereditary risk for cancer in the family. The cancers in Ms. Wunschel and/or her family may be sporadic/familial or due to other genetic and environmental factors. 4. It is also possible there is a hereditary cause for the cancer in the family that Ms. Gentzler did not inherit.  Therefore, it is recommended she continue to follow the cancer management and screening guidelines provided by her oncology and  primary healthcare provider. An individual's cancer risk and medical management are not determined by genetic test results alone. Overall cancer risk assessment incorporates additional factors, including personal medical history, family history, and any available genetic information that may result in a personalized plan for cancer prevention and surveillance  Given Ms. Hoffman personal and family histories, we must interpret these negative results with some caution.  Families with features suggestive of hereditary risk for cancer tend to have multiple family members with cancer, diagnoses in multiple generations and diagnoses before the age of 5. Ms. Rosello family exhibits some of these features. Thus, this result may simply reflect our current inability to  detect all mutations within these genes or there may be a different gene that has not yet been discovered or tested.   An individual's cancer risk and medical management are not determined by genetic test results alone. Overall cancer risk assessment incorporates additional factors, including personal medical history, family history, and any available genetic information that may result in a personalized plan for cancer prevention and surveillance.  RECOMMENDATIONS FOR FAMILY MEMBERS:  Individuals in this family might be at some increased risk of developing cancer, over the general population risk, simply due to the family history of cancer.  We recommended women in this family have a yearly mammogram beginning at age 18, or 13 years younger than the earliest onset of cancer, an annual clinical breast exam, and perform monthly breast self-exams. Women in this family should also have a gynecological exam as recommended by their primary provider. All family members should be referred for colonoscopy starting at age 30, or 85 years younger than the earliest onset of cancer.  It is also possible there is a hereditary cause for the cancer in Ms. Crocker family that she did not inherit and therefore was not identified in her. Family members diagnosed with cancer may still benefit from completing their own genetic testing.   FOLLOW-UP: Lastly, we discussed with Ms. Spitzley that cancer genetics is a rapidly advancing field and it is possible that new genetic tests will be appropriate for her and/or her family members in the future. We encouraged her to remain in contact with cancer genetics on an annual basis so we can update her personal and family histories and let her know of advances in cancer genetics that may benefit this family.   Our contact number was provided. Ms. Bradt questions were answered to her satisfaction, and she knows she is welcome to call us  at anytime with additional questions or concerns.    Burnard Ogren, MS, [facility] Licensed, Retail [email or web address]

## 2024-03-29 ENCOUNTER — Ambulatory Visit: Payer: Self-pay | Admitting: General Surgery

## 2024-03-29 LAB — SURGICAL PATHOLOGY

## 2024-04-02 ENCOUNTER — Encounter: Payer: Self-pay | Admitting: *Deleted

## 2024-04-02 ENCOUNTER — Encounter: Payer: Self-pay | Admitting: General Practice

## 2024-04-02 NOTE — Progress Notes (Signed)
 [facility] Spiritual Care Note  Followed up postoperatively by phone. Ms Kozuch was appreciative of call, engaging readily in reflection and sharing. She reports relief at learning about clear margins, even as she is continuing to process the shock of the cancer diagnosis.  Ms Whitehouse reports having several hobbies that help with meaning-making and coping. Encouraged her to explore programs through [facility] and [facility]; added her to the program mailing list per her request.  She plans to phone chaplain during radiation period in order to schedule Spiritual Care office visit after one of her treatments.   04/02/24 1200  Spiritual Encounters  Type of Visit Follow up  Care provided to: Patient  Reason for visit Routine spiritual support  Spiritual Framework  Presenting Themes Coping tools;Meaning/purpose/sources of inspiration  Strengths hobbies (meaning-making/coping tools)  Patient Stress Factors Health changes   Chaplain [name], [place], [facility] Pager [phone] Voicemail [phone]

## 2024-04-03 ENCOUNTER — Ambulatory Visit (INDEPENDENT_AMBULATORY_CARE_PROVIDER_SITE_OTHER): Payer: Medicare (Managed Care) | Admitting: Psychiatry

## 2024-04-03 ENCOUNTER — Encounter (HOSPITAL_COMMUNITY): Payer: Self-pay | Admitting: Psychiatry

## 2024-04-03 VITALS — BP 138/78 | HR 63 | Temp 98.8°F | Ht 62.0 in | Wt 149.0 lb

## 2024-04-03 DIAGNOSIS — F341 Dysthymic disorder: Secondary | ICD-10-CM | POA: Diagnosis not present

## 2024-04-03 DIAGNOSIS — Z630 Problems in relationship with spouse or partner: Secondary | ICD-10-CM

## 2024-04-03 DIAGNOSIS — F411 Generalized anxiety disorder: Secondary | ICD-10-CM | POA: Diagnosis not present

## 2024-04-03 MED ORDER — VENLAFAXINE HCL ER 150 MG PO CP24: 300.0000 mg | ORAL_CAPSULE | Freq: Every day | ORAL | 1 refills | Status: DC

## 2024-04-03 MED ORDER — HYDROXYZINE HCL 10 MG PO TABS: 10.0000 mg | ORAL_TABLET | Freq: Every day | ORAL | 0 refills | Status: AC | PRN

## 2024-04-03 MED ORDER — BUPROPION HCL ER (SR) 100 MG PO TB12
100.0000 mg | ORAL_TABLET | Freq: Every day | ORAL | 1 refills | Status: DC
Start: 2024-04-03 — End: 2024-07-13

## 2024-04-03 NOTE — Progress Notes (Signed)
 Patient ID: Mallory [name], female   DOB: 1955-12-17, 68 y.o.   MRN: [id number] [facility] MD Progress Note Tele psych 04/03/2024 10:47 AM Mallory [name]  MRN:  [id number] CC/ depression follow up  HPI Patient has had some recent medical challenges and diagnosed with breast cancer. Localized, got surgery lumpectomy done Also diagnosed with arthritis and may have surgery of joing  Overal managing stress despite and mood described fair  Gardening helps, goes out for it  Aggravating factor: recent medical diagnosis  Anxiety: doing fair  Depression manageable  Modifying factors : friends, gardening when she can  Duration adult life Severity:  manageable   Principal Problem: GAD Diagnosis:   Patient Active Problem List   Diagnosis Date Noted   Genetic testing [Z13.79] 03/28/2024   Melanoma of skin (HCC) [C43.9] 03/15/2024   Family history of melanoma [Z80.8]    Personal history of melanoma in-situ [Z86.006]    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] 03/12/2024   Hyperlipidemia [E78.5] 07/27/2023   Elevated coronary artery calcium  score [R93.1] 07/27/2023   Tobacco abuse [Z72.0] 04/22/2021   Atypical chest pain [R07.89] 04/22/2021   Syncope [R55] 03/11/2017   Menopause [Z78.0] 12/03/2014   Marital conflict [Z63.0] 06/21/2014   GAD (generalized anxiety disorder) [F41.1]    GERD (gastroesophageal reflux disease) [K21.9]    Peri-menopause [N95.1]    Headache [R51.9]    Generalized anxiety disorder [F41.1] 03/05/2013   Total Time spent with patient: 30 minutes   Past Medical History:  Past Medical History:  Diagnosis Date   Anxiety    Arthritis    right hip, left thumb   Family history of melanoma    GAD (generalized anxiety disorder)    GERD (gastroesophageal reflux disease)    Headache    Hx of cardiovascular stress test    ETT-Myoview (03/2014):  EF 58%, no ischemia; normal   Personal history of melanoma in-situ     Past Surgical History:  Procedure  Laterality Date   BREAST LUMPECTOMY WITH RADIOACTIVE SEED LOCALIZATION Right 03/27/2024   Procedure: RIGHT BREAST SEED LOCALIZED LUMPECTOMY;  Surgeon: Aron Shoulders, MD;  Location: [facility];  Service: General;  Laterality: Right;   CESAREAN SECTION     DILITATION & CURRETTAGE/HYSTROSCOPY WITH ESSURE     EXPLORATORY LAPAROTOMY     HERNIA REPAIR     as baby   spleenectomy     at age 66   TUBAL LIGATION     Family History:  Family History  Problem Relation Age of Onset   Melanoma Mother    Coronary artery disease Maternal Grandmother    Cancer Maternal Grandmother    Coronary artery disease Maternal Grandfather    Cancer Maternal Grandfather    Coronary artery disease Paternal Grandmother    Cancer Paternal Grandmother    Coronary artery disease Paternal Grandfather    Cancer Paternal Grandfather    Melanoma Daughter 61   Diabetes Cousin    Alcohol abuse Neg Hx    Anxiety disorder Neg Hx    Bipolar disorder Neg Hx    Depression Neg Hx    Drug abuse Neg Hx    OCD Neg Hx    Schizophrenia Neg Hx    ADD / ADHD Neg Hx    Social History:  Social History   Substance and Sexual Activity  Alcohol Use Yes   Comment: social     Social History   Substance and Sexual Activity  Drug Use No  Social History   Socioeconomic History   Marital status: Widowed    Spouse name: Not on file   Number of children: Not on file   Years of education: Not on file   Highest education level: Not on file  Occupational History   Not on file  Tobacco Use   Smoking status: Every Day    Current packs/day: 0.50    Average packs/day: 0.5 packs/day for 39.0 years (19.5 ttl pk-yrs)    Types: Cigarettes   Smokeless tobacco: Never   Tobacco comments:    Working to quit currently as she is cutting back and wants to quit.  Vaping Use   Vaping status: Never Used  Substance and Sexual Activity   Alcohol use: Yes    Comment: social   Drug use: No   Sexual activity: Not Currently     Partners: Male    Birth control/protection: Post-menopausal  Other Topics Concern   Not on file  Social History Narrative   Not on file   Social Drivers of Health   Financial Resource Strain: Low Risk  (11/16/2023)   Received from [facility]   Overall Financial Resource Strain (CARDIA)    Difficulty of Paying Living Expenses: Not hard at all  Food Insecurity: No Food Insecurity (03/26/2024)   Hunger Vital Sign    Worried About Running Out of Food in the Last Year: Never true    Ran Out of Food in the Last Year: Never true  Transportation Needs: No Transportation Needs (03/26/2024)   PRAPARE - Administrator, Civil Service (Medical): No    Lack of Transportation (Non-Medical): No  Physical Activity: Inactive (04/16/2021)   Received from [facility]   Exercise Vital Sign    On average, how many days per week do you engage in moderate to strenuous exercise (like a brisk walk)?: 0 days    On average, how many minutes do you engage in exercise at this level?: 0 min  Stress: Stress Concern Present (04/16/2021)   Received from [facility] - Occupational Stress Questionnaire    Feeling of Stress : To some extent  Social Connections: Unknown (01/15/2022)   Received from [facility]   Social Network    Social Network: Not on file     Psychiatric Specialty Exam: Physical Exam  Review of Systems  Cardiovascular:  Negative for chest pain and palpitations.  Musculoskeletal:  Positive for joint pain.  Neurological:  Negative for tremors.  Psychiatric/Behavioral:  Negative for suicidal ideas.     Body mass index is 27.25 kg/m.  General Appearance:casual  Eye Contact:: fair  Speech:  Clear and Coherent  Volume:  Normal  Mood: fair  Affect:  congruent  Thought Process:  Coherent and Goal Directed  Orientation:  Full (Time, Place, and Person)  Thought Content:  WDL  Suicidal Thoughts:  No  Homicidal Thoughts:  No  Memory:   NA  Judgement:  Intact  Insight:  Good and Present  Psychomotor Activity:  Normal  Concentration:  Good  Recall:  Good  Fund of Knowledge:Good  Language: Good  Akathisia:  No  Handed:  Right  AIMS (if indicated):     Assets:  Communication Skills Desire for Improvement Financial Resources/Insurance Housing Leisure Time Physical Health Resilience Social Support Talents/Skills Transportation  ADL's:  Intact  Cognition: WNL  Sleep:        Current Medications: Current Outpatient Medications  Medication Sig Dispense Refill  atorvastatin  (LIPITOR) 40 MG tablet Take 1 tablet (40 mg total) by mouth daily. 90 tablet 3   buPROPion  ER (WELLBUTRIN  SR) 100 MG 12 hr tablet Take 1 tablet (100 mg total) by mouth daily. 30 tablet 0   cetirizine (ZYRTEC) 10 MG tablet Take 10 mg by mouth daily.     famotidine (PEPCID) 20 MG tablet Take 20 mg by mouth in the morning and at bedtime.     fluticasone (FLONASE) 50 MCG/ACT nasal spray Place 2 sprays into the nose.     gabapentin (NEURONTIN) 100 MG capsule Take 100 mg by mouth daily as needed (hormonal hot flashes).     hydrOXYzine  (ATARAX ) 10 MG tablet Take 1 tablet (10 mg total) by mouth daily as needed for anxiety.     omeprazole (PRILOSEC) 20 MG capsule Take 20 mg by mouth in the morning.     oxyCODONE  (OXY IR/ROXICODONE ) 5 MG immediate release tablet Take 1 tablet (5 mg total) by mouth every 6 (six) hours as needed for severe pain (pain score 7-10). 15 tablet 0   propranolol ER (INDERAL LA) 60 MG 24 hr capsule Take 60 mg by mouth at bedtime.     venlafaxine  XR (EFFEXOR -XR) 150 MG 24 hr capsule Take 2 capsules (300 mg total) by mouth daily with breakfast. 60 capsule 0   b complex vitamins capsule Take 1 capsule by mouth daily. (Patient not taking: Reported on 04/03/2024)     Magnesium Gluconate 550 MG TABS Take by mouth. (Patient not taking: Reported on 04/03/2024)     No current facility-administered medications for this visit.    Lab  Results: No results found for this or any previous visit (from the past 48 hours).  Physical Findings: AIMS:  CIWA:   COWS:    Treatment Plan Summary: Medication management Prior documentation reviewed   MDD: managing it on meds, continue wellbutrin  \ Handling the post surgery and may be on radiation for breast cancer Has brothers support  GAD:  Stressed but managing with effexor  and is on therapy to help out    Direct care time including face to face 18 minutes  including documentation , chart review  Fu 4 m.  10:47 AM 04/03/2024

## 2024-04-20 ENCOUNTER — Other Ambulatory Visit: Payer: Self-pay | Admitting: Radiation Oncology

## 2024-04-20 ENCOUNTER — Inpatient Hospital Stay
Admission: RE | Admit: 2024-04-20 | Discharge: 2024-04-20 | Disposition: A | Discharge status: Home / Self Care | Payer: Self-pay | Source: Ambulatory Visit | Attending: Radiation Oncology | Admitting: Radiation Oncology

## 2024-04-20 DIAGNOSIS — D0511 Intraductal carcinoma in situ of right breast: Secondary | ICD-10-CM

## 2024-04-23 NOTE — Progress Notes (Signed)
 Radiation Oncology         [phone] ________________________________  Name: Mallory [name] MRN: [id number]  Date: 04/25/2024  DOB: [birth year]  Re-Evaluation Note  CC: Mannie Olam Shiver, PA  Odean Potts, MD    ICD-10-CM   1. Ductal carcinoma in situ (DCIS) of right breast  D05.11       Diagnosis:   The encounter diagnosis was Ductal carcinoma in situ (DCIS) of right breast.   Stage 0 (cTis (DCIS), cN0, cM0) Right Breast, Intermediate grade DCIS, ER+ / PR+ / Her2 not assessed  Narrative:  The patient returns today to discuss radiation treatment options. She was seen in the multidisciplinary breast clinic on 03/14/24.   Since consultation, she underwent genetic testing on 03/28/24. Results showed no pathogenic mutations contributing to her recent diagnosis.  Patient presented for a consult with Dr. Gudena on 03/14/24 to discuss further treatment plan. Upon discussion, they opted to proceed with adjuvant radiation therapy followed by antiestrogen therapy with tamoxifen 5 years.    patient opted to proceed with right breast seed localized lumpectomy w/o nodal biopsy on 03/27/24 under the care of Dr. Aron. Pathology from the procedure revealed: tumor size of 0.8 cm; histology of intermediate nuclear grade solid papillary DCIS with microcalcifications present. Negative for invasive disease. All margins are free of DCIS, final anterior margin at least 10 mm and final superior margin at least 17 mm. Prognostic indicators significant for: estrogen receptor positive with 100%, strong staining; progesterone receptor positive with 70%, strong staining; Her2 status not applicable; Grade intermediate.       On review of systems, the patient reports doing well since her surgery.  She did not require any significant pain medication after her lumpectomy.. She denies drainage from the breast or swelling in the breast or arm swelling.   Allergies:  is allergic to banana and latex.  Meds: Current  Outpatient Medications  Medication Sig Dispense Refill   atorvastatin  (LIPITOR) 40 MG tablet Take 1 tablet (40 mg total) by mouth daily. 90 tablet 3   buPROPion  ER (WELLBUTRIN  SR) 100 MG 12 hr tablet Take 1 tablet (100 mg total) by mouth daily. 30 tablet 1   cetirizine (ZYRTEC) 10 MG tablet Take 10 mg by mouth daily.     famotidine (PEPCID) 20 MG tablet Take 20 mg by mouth in the morning and at bedtime.     fluticasone (FLONASE) 50 MCG/ACT nasal spray Place 2 sprays into the nose.     gabapentin (NEURONTIN) 100 MG capsule Take 100 mg by mouth daily as needed (hormonal hot flashes).     hydrOXYzine  (ATARAX ) 10 MG tablet Take 1 tablet (10 mg total) by mouth daily as needed for anxiety. 30 tablet 0   omeprazole (PRILOSEC) 20 MG capsule Take 20 mg by mouth in the morning.     oxyCODONE  (OXY IR/ROXICODONE ) 5 MG immediate release tablet Take 1 tablet (5 mg total) by mouth every 6 (six) hours as needed for severe pain (pain score 7-10). 15 tablet 0   propranolol ER (INDERAL LA) 60 MG 24 hr capsule Take 60 mg by mouth at bedtime.     venlafaxine  XR (EFFEXOR -XR) 150 MG 24 hr capsule Take 2 capsules (300 mg total) by mouth daily with breakfast. 60 capsule 1   b complex vitamins capsule Take 1 capsule by mouth daily. (Patient not taking: Reported on 04/25/2024)     Magnesium Gluconate 550 MG TABS Take by mouth. (Patient not taking: Reported on 04/25/2024)  No current facility-administered medications for this encounter.    Physical Findings: The patient is in no acute distress. Patient is alert and oriented.  height is 5' 3 (1.6 m) and weight is 148 lb 3.2 oz (67.2 kg). Her oral temperature is 98.5 F (36.9 C). Her blood pressure is 130/71 and her pulse is 68. Her respiration is 17 and oxygen saturation is 99%.  No significant changes. Lungs are clear to auscultation bilaterally. Heart has regular rate and rhythm. No palpable cervical, supraclavicular, or axillary adenopathy. Abdomen soft, non-tender,  normal bowel sounds. Left breast: no palpable mass, nipple discharge or bleeding. Right breast: Surgical scar on the lateral aspect of the breast area which is well-healed without signs of drainage or infection.  No dominant mass appreciated in the breast nipple discharge or bleeding.  Lab Findings: Lab Results  Component Value Date   WBC 9.9 03/14/2024   HGB 12.4 03/14/2024   HCT 38.0 03/14/2024   MCV 91.8 03/14/2024   PLT 259 03/14/2024    Radiographic Findings: No results found.  Impression: Stage 0 (cTis (DCIS), cN0, cM0) Right Breast, Intermediate grade DCIS, ER+ / PR+ / Her2 not assessed  Today we reviewed the patient's pathology.  She was found to have an intermediate grade DCIS with clear  surgical margins.  The closest margin was posteriorly at 5.5 mm.  She is unsure whether she will be able to proceed with adjuvant hormonal therapy or whether she will agree to this treatment given her long-term use of hormone replacement.  She does however wish to proceed with adjuvant radiation therapy to reduce chances of recurrence within the right breast.  I discussed the overall course of treatment anticipated side effects and potential long-term toxicities of right sided breast radiation treatments.  She appears to understand and wishes to proceed with planned course of treatment.  Plan:  Patient is scheduled for CT simulation later today.  She will begin her treatments approximately a week from now.  She would be a good candidate for hypofractionated accelerated radiation therapy over approximately 4 weeks.  -----------------------------------  Lynwood CHARM Nasuti, PhD, MD   This document serves as a record of services personally performed by Lynwood Nasuti, MD. It was created on his behalf by Reymundo Cartwright, a trained medical scribe. The creation of this record is based on the scribe's personal observations and the provider's statements to them. This document has been checked and approved by  the attending provider.

## 2024-04-24 ENCOUNTER — Telehealth: Payer: Self-pay | Admitting: Radiation Oncology

## 2024-04-24 NOTE — Progress Notes (Signed)
 Location of Breast Cancer:right DCIS  Histology per Pathology Report:    Receptor Status: ER(100), PR (70), Her2-neu (), Ki-67()  Did patient present with symptoms (if so, please note symptoms) or was this found on screening mammography?: Palpable mass which was confirmed with mammogram.   Past/Anticipated interventions by surgeon, if jwb:mphyu breat lumpectomy  Past/Anticipated interventions by medical oncology, if any:   Lymphedema issues, if any:  Denies  Pain issues, if any: Denies   SAFETY ISSUES: Prior radiation? No Pacemaker/ICD? No Possible current pregnancy?no Is the patient on methotrexate? no  Current Complaints / other details: Is there       BP 130/71 (BP Location: Left Arm, Patient Position: Sitting)   Pulse 68   Temp 98.5 F (36.9 C) (Oral)   Resp 17   Ht 5' 3 (1.6 m)   Wt 148 lb 3.2 oz (67.2 kg)   LMP 11/11/2012   SpO2 99%   BMI 26.25 kg/m

## 2024-04-24 NOTE — Telephone Encounter (Signed)
 8/12 Called pt after received voicemail.  She had questions about what to wear for her consult/ct sim on tomorrow.  Pt question where answer and grateful for call back.

## 2024-04-25 ENCOUNTER — Ambulatory Visit
Admission: RE | Admit: 2024-04-25 | Discharge: 2024-04-25 | Disposition: A | Discharge status: Home / Self Care | Payer: Medicare (Managed Care) | Source: Ambulatory Visit | Attending: Radiation Oncology | Admitting: Radiation Oncology

## 2024-04-25 VITALS — BP 130/71 | HR 68 | Temp 98.5°F | Resp 17 | Ht 63.0 in | Wt 148.2 lb

## 2024-04-25 DIAGNOSIS — Z510 Encounter for antineoplastic radiation therapy: Secondary | ICD-10-CM | POA: Diagnosis present

## 2024-04-25 DIAGNOSIS — D0511 Intraductal carcinoma in situ of right breast: Secondary | ICD-10-CM | POA: Diagnosis not present

## 2024-04-25 DIAGNOSIS — Z79899 Other long term (current) drug therapy: Secondary | ICD-10-CM | POA: Diagnosis not present

## 2024-04-25 DIAGNOSIS — Z1721 Progesterone receptor positive status: Secondary | ICD-10-CM | POA: Diagnosis not present

## 2024-04-25 DIAGNOSIS — Z79810 Long term (current) use of selective estrogen receptor modulators (SERMs): Secondary | ICD-10-CM | POA: Insufficient documentation

## 2024-04-25 DIAGNOSIS — Z170 Estrogen receptor positive status [ER+]: Secondary | ICD-10-CM | POA: Insufficient documentation

## 2024-04-26 DIAGNOSIS — Z510 Encounter for antineoplastic radiation therapy: Secondary | ICD-10-CM | POA: Diagnosis not present

## 2024-05-01 ENCOUNTER — Encounter: Payer: Self-pay | Admitting: *Deleted

## 2024-05-01 DIAGNOSIS — D0511 Intraductal carcinoma in situ of right breast: Secondary | ICD-10-CM

## 2024-05-03 ENCOUNTER — Other Ambulatory Visit: Payer: Self-pay

## 2024-05-03 ENCOUNTER — Ambulatory Visit
Admission: RE | Admit: 2024-05-03 | Discharge: 2024-05-03 | Disposition: A | Discharge status: Home / Self Care | Payer: Medicare (Managed Care) | Source: Ambulatory Visit | Attending: Radiation Oncology | Admitting: Radiation Oncology

## 2024-05-03 DIAGNOSIS — D0511 Intraductal carcinoma in situ of right breast: Secondary | ICD-10-CM

## 2024-05-03 DIAGNOSIS — Z510 Encounter for antineoplastic radiation therapy: Secondary | ICD-10-CM | POA: Diagnosis not present

## 2024-05-03 LAB — RAD ONC ARIA SESSION SUMMARY
Course Elapsed Days: 0
Plan Fractions Treated to Date: 1
Plan Prescribed Dose Per Fraction: 2.67 Gy
Plan Total Fractions Prescribed: 15
Plan Total Prescribed Dose: 40.05 Gy
Reference Point Dosage Given to Date: 2.67 Gy
Reference Point Session Dosage Given: 2.67 Gy
Session Number: 1

## 2024-05-03 NOTE — Progress Notes (Incomplete)
 Pre-seed nursing interview for a diagnosis of ***  Patient identity verified x2.   Patient states issues as follows...  -Pain: *** -Fatigue: *** -Abdomen: *** -Groin: *** -Urinary: *** -Bowels: *** -Appetite: ***  Patient denies all other related issues at this time.  Meaningful use complete.  Urinary Management medication(s)- *** Urology appointment date- ***, with Dr. PIERRETTE at ***  No vitals needed for this visit.  This concludes the interaction.  NURSE REMINDER: START 'PRE-SEED' EDUCATION VIDEO AT 4:25 mins.

## 2024-05-04 ENCOUNTER — Ambulatory Visit: Payer: Medicare (Managed Care)

## 2024-05-07 ENCOUNTER — Other Ambulatory Visit: Payer: Self-pay

## 2024-05-07 ENCOUNTER — Ambulatory Visit
Admission: RE | Admit: 2024-05-07 | Discharge: 2024-05-07 | Disposition: A | Discharge status: Home / Self Care | Payer: Medicare (Managed Care) | Source: Ambulatory Visit | Attending: Radiation Oncology | Admitting: Radiation Oncology

## 2024-05-07 DIAGNOSIS — Z510 Encounter for antineoplastic radiation therapy: Secondary | ICD-10-CM | POA: Diagnosis not present

## 2024-05-07 LAB — RAD ONC ARIA SESSION SUMMARY
Course Elapsed Days: 4
Plan Fractions Treated to Date: 2
Plan Prescribed Dose Per Fraction: 2.67 Gy
Plan Total Fractions Prescribed: 15
Plan Total Prescribed Dose: 40.05 Gy
Reference Point Dosage Given to Date: 5.34 Gy
Reference Point Session Dosage Given: 2.67 Gy
Session Number: 2

## 2024-05-08 ENCOUNTER — Ambulatory Visit
Admission: RE | Admit: 2024-05-08 | Discharge: 2024-05-08 | Disposition: A | Discharge status: Home / Self Care | Payer: Medicare (Managed Care) | Source: Ambulatory Visit | Attending: Radiation Oncology | Admitting: Radiation Oncology

## 2024-05-08 ENCOUNTER — Other Ambulatory Visit: Payer: Self-pay

## 2024-05-08 DIAGNOSIS — Z510 Encounter for antineoplastic radiation therapy: Secondary | ICD-10-CM | POA: Diagnosis not present

## 2024-05-08 DIAGNOSIS — D0511 Intraductal carcinoma in situ of right breast: Secondary | ICD-10-CM

## 2024-05-08 LAB — RAD ONC ARIA SESSION SUMMARY
Course Elapsed Days: 5
Plan Fractions Treated to Date: 3
Plan Prescribed Dose Per Fraction: 2.67 Gy
Plan Total Fractions Prescribed: 15
Plan Total Prescribed Dose: 40.05 Gy
Reference Point Dosage Given to Date: 8.01 Gy
Reference Point Session Dosage Given: 2.67 Gy
Session Number: 3

## 2024-05-08 MED ORDER — RADIAPLEXRX EX GEL: Freq: Once | CUTANEOUS | Status: AC

## 2024-05-08 MED ORDER — ALRA NON-METALLIC DEODORANT (RAD-ONC)
1.0000 | Freq: Once | TOPICAL | Status: AC
  Administered 2024-05-08: 1 via TOPICAL

## 2024-05-09 ENCOUNTER — Ambulatory Visit
Admission: RE | Admit: 2024-05-09 | Discharge: 2024-05-09 | Disposition: A | Discharge status: Home / Self Care | Payer: Medicare (Managed Care) | Source: Ambulatory Visit | Attending: Radiation Oncology | Admitting: Radiation Oncology

## 2024-05-09 ENCOUNTER — Other Ambulatory Visit: Payer: Self-pay

## 2024-05-09 DIAGNOSIS — Z510 Encounter for antineoplastic radiation therapy: Secondary | ICD-10-CM | POA: Diagnosis not present

## 2024-05-09 LAB — RAD ONC ARIA SESSION SUMMARY
Course Elapsed Days: 6
Plan Fractions Treated to Date: 4
Plan Prescribed Dose Per Fraction: 2.67 Gy
Plan Total Fractions Prescribed: 15
Plan Total Prescribed Dose: 40.05 Gy
Reference Point Dosage Given to Date: 10.68 Gy
Reference Point Session Dosage Given: 2.67 Gy
Session Number: 4

## 2024-05-10 ENCOUNTER — Other Ambulatory Visit: Payer: Self-pay

## 2024-05-10 ENCOUNTER — Ambulatory Visit
Admission: RE | Admit: 2024-05-10 | Discharge: 2024-05-10 | Disposition: A | Discharge status: Home / Self Care | Payer: Medicare (Managed Care) | Source: Ambulatory Visit | Attending: Radiation Oncology | Admitting: Radiation Oncology

## 2024-05-10 DIAGNOSIS — Z510 Encounter for antineoplastic radiation therapy: Secondary | ICD-10-CM | POA: Diagnosis not present

## 2024-05-10 LAB — RAD ONC ARIA SESSION SUMMARY
Course Elapsed Days: 7
Plan Fractions Treated to Date: 5
Plan Prescribed Dose Per Fraction: 2.67 Gy
Plan Total Fractions Prescribed: 15
Plan Total Prescribed Dose: 40.05 Gy
Reference Point Dosage Given to Date: 13.35 Gy
Reference Point Session Dosage Given: 2.67 Gy
Session Number: 5

## 2024-05-11 ENCOUNTER — Other Ambulatory Visit: Payer: Self-pay

## 2024-05-11 ENCOUNTER — Ambulatory Visit
Admission: RE | Admit: 2024-05-11 | Discharge: 2024-05-11 | Disposition: A | Discharge status: Home / Self Care | Payer: Medicare (Managed Care) | Source: Ambulatory Visit | Attending: Radiation Oncology | Admitting: Radiation Oncology

## 2024-05-11 DIAGNOSIS — Z510 Encounter for antineoplastic radiation therapy: Secondary | ICD-10-CM | POA: Diagnosis not present

## 2024-05-11 LAB — RAD ONC ARIA SESSION SUMMARY
Course Elapsed Days: 8
Plan Fractions Treated to Date: 6
Plan Prescribed Dose Per Fraction: 2.67 Gy
Plan Total Fractions Prescribed: 15
Plan Total Prescribed Dose: 40.05 Gy
Reference Point Dosage Given to Date: 16.02 Gy
Reference Point Session Dosage Given: 2.67 Gy
Session Number: 6

## 2024-05-15 ENCOUNTER — Other Ambulatory Visit: Payer: Self-pay

## 2024-05-15 ENCOUNTER — Ambulatory Visit
Admission: RE | Admit: 2024-05-15 | Discharge: 2024-05-15 | Disposition: A | Discharge status: Home / Self Care | Payer: Medicare (Managed Care) | Source: Ambulatory Visit | Attending: Radiation Oncology | Admitting: Radiation Oncology

## 2024-05-15 DIAGNOSIS — D0511 Intraductal carcinoma in situ of right breast: Secondary | ICD-10-CM | POA: Insufficient documentation

## 2024-05-15 LAB — RAD ONC ARIA SESSION SUMMARY
Course Elapsed Days: 12
Plan Fractions Treated to Date: 7
Plan Prescribed Dose Per Fraction: 2.67 Gy
Plan Total Fractions Prescribed: 15
Plan Total Prescribed Dose: 40.05 Gy
Reference Point Dosage Given to Date: 18.69 Gy
Reference Point Session Dosage Given: 2.67 Gy
Session Number: 7

## 2024-05-16 ENCOUNTER — Ambulatory Visit
Admission: RE | Admit: 2024-05-16 | Discharge: 2024-05-16 | Disposition: A | Discharge status: Home / Self Care | Payer: Medicare (Managed Care) | Source: Ambulatory Visit | Attending: Radiation Oncology | Admitting: Radiation Oncology

## 2024-05-16 ENCOUNTER — Other Ambulatory Visit: Payer: Self-pay

## 2024-05-16 DIAGNOSIS — D0511 Intraductal carcinoma in situ of right breast: Secondary | ICD-10-CM | POA: Diagnosis not present

## 2024-05-16 LAB — RAD ONC ARIA SESSION SUMMARY
Course Elapsed Days: 13
Plan Fractions Treated to Date: 8
Plan Prescribed Dose Per Fraction: 2.67 Gy
Plan Total Fractions Prescribed: 15
Plan Total Prescribed Dose: 40.05 Gy
Reference Point Dosage Given to Date: 21.36 Gy
Reference Point Session Dosage Given: 2.67 Gy
Session Number: 8

## 2024-05-17 ENCOUNTER — Ambulatory Visit: Payer: Medicare (Managed Care)

## 2024-05-18 ENCOUNTER — Ambulatory Visit
Admission: RE | Admit: 2024-05-18 | Discharge: 2024-05-18 | Disposition: A | Discharge status: Home / Self Care | Payer: Medicare (Managed Care) | Source: Ambulatory Visit | Attending: Radiation Oncology | Admitting: Radiation Oncology

## 2024-05-18 ENCOUNTER — Ambulatory Visit: Payer: Medicare (Managed Care)

## 2024-05-18 ENCOUNTER — Other Ambulatory Visit: Payer: Self-pay

## 2024-05-18 DIAGNOSIS — D0511 Intraductal carcinoma in situ of right breast: Secondary | ICD-10-CM | POA: Diagnosis not present

## 2024-05-18 LAB — RAD ONC ARIA SESSION SUMMARY
Course Elapsed Days: 15
Plan Fractions Treated to Date: 9
Plan Prescribed Dose Per Fraction: 2.67 Gy
Plan Total Fractions Prescribed: 15
Plan Total Prescribed Dose: 40.05 Gy
Reference Point Dosage Given to Date: 24.03 Gy
Reference Point Session Dosage Given: 2.67 Gy
Session Number: 9

## 2024-05-21 ENCOUNTER — Ambulatory Visit: Payer: Medicare (Managed Care)

## 2024-05-22 ENCOUNTER — Ambulatory Visit
Admission: RE | Admit: 2024-05-22 | Discharge: 2024-05-22 | Disposition: A | Discharge status: Home / Self Care | Payer: Medicare (Managed Care) | Source: Ambulatory Visit | Attending: Radiation Oncology | Admitting: Radiation Oncology

## 2024-05-22 ENCOUNTER — Telehealth: Payer: Self-pay | Admitting: Adult Health

## 2024-05-22 ENCOUNTER — Other Ambulatory Visit: Payer: Self-pay

## 2024-05-22 ENCOUNTER — Telehealth: Payer: Self-pay | Admitting: Radiation Oncology

## 2024-05-22 ENCOUNTER — Ambulatory Visit: Payer: Medicare (Managed Care) | Admitting: Radiation Oncology

## 2024-05-22 DIAGNOSIS — D0511 Intraductal carcinoma in situ of right breast: Secondary | ICD-10-CM | POA: Diagnosis not present

## 2024-05-22 LAB — RAD ONC ARIA SESSION SUMMARY
Course Elapsed Days: 19
Plan Fractions Treated to Date: 10
Plan Prescribed Dose Per Fraction: 2.67 Gy
Plan Total Fractions Prescribed: 15
Plan Total Prescribed Dose: 40.05 Gy
Reference Point Dosage Given to Date: 26.7 Gy
Reference Point Session Dosage Given: 2.67 Gy
Session Number: 10

## 2024-05-22 NOTE — Telephone Encounter (Signed)
 Sent inbasket to Rosaline Ide regarding referral from Dr. Shannon for social work.

## 2024-05-22 NOTE — Telephone Encounter (Signed)
 Scheduled patient for survivorship. Left the patient a voicemail with appointment details.

## 2024-05-23 ENCOUNTER — Telehealth: Payer: Self-pay | Admitting: Licensed Clinical Social Worker

## 2024-05-23 ENCOUNTER — Ambulatory Visit
Admission: RE | Admit: 2024-05-23 | Discharge: 2024-05-23 | Disposition: A | Discharge status: Home / Self Care | Payer: Medicare (Managed Care) | Source: Ambulatory Visit | Attending: Radiation Oncology | Admitting: Radiation Oncology

## 2024-05-23 ENCOUNTER — Other Ambulatory Visit: Payer: Self-pay

## 2024-05-23 DIAGNOSIS — D0511 Intraductal carcinoma in situ of right breast: Secondary | ICD-10-CM | POA: Diagnosis not present

## 2024-05-23 LAB — RAD ONC ARIA SESSION SUMMARY
Course Elapsed Days: 20
Plan Fractions Treated to Date: 11
Plan Prescribed Dose Per Fraction: 2.67 Gy
Plan Total Fractions Prescribed: 15
Plan Total Prescribed Dose: 40.05 Gy
Reference Point Dosage Given to Date: 29.37 Gy
Reference Point Session Dosage Given: 2.67 Gy
Session Number: 11

## 2024-05-23 NOTE — Telephone Encounter (Signed)
[facility] Clinical Social Work  Clinical Social Work was referred by medical provider for assessment of psychosocial needs.  Clinical Social Worker attempted to contact patient by phone  to offer support and assess for needs.   No answer. Left VM with direct contact information.     Jimeka Balan E Nattalie Santiesteban, LCSW  Clinical Social Worker [facility]         

## 2024-05-24 ENCOUNTER — Other Ambulatory Visit: Payer: Self-pay

## 2024-05-24 ENCOUNTER — Encounter: Payer: Self-pay | Admitting: General Practice

## 2024-05-24 ENCOUNTER — Ambulatory Visit
Admission: RE | Admit: 2024-05-24 | Discharge: 2024-05-24 | Disposition: A | Discharge status: Home / Self Care | Payer: Medicare (Managed Care) | Source: Ambulatory Visit | Attending: Radiation Oncology | Admitting: Radiation Oncology

## 2024-05-24 DIAGNOSIS — D0511 Intraductal carcinoma in situ of right breast: Secondary | ICD-10-CM | POA: Diagnosis not present

## 2024-05-24 LAB — RAD ONC ARIA SESSION SUMMARY
Course Elapsed Days: 21
Plan Fractions Treated to Date: 12
Plan Prescribed Dose Per Fraction: 2.67 Gy
Plan Total Fractions Prescribed: 15
Plan Total Prescribed Dose: 40.05 Gy
Reference Point Dosage Given to Date: 32.04 Gy
Reference Point Session Dosage Given: 2.67 Gy
Session Number: 12

## 2024-05-24 NOTE — Progress Notes (Signed)
 [facility] Spiritual Care Note  Made check-in call, leaving voicemail with direct number and encouragement to return call.  [address] [name], [place], [facility] Pager [phone] Voicemail [phone]

## 2024-05-25 ENCOUNTER — Ambulatory Visit: Payer: Medicare (Managed Care)

## 2024-05-25 ENCOUNTER — Telehealth: Payer: Self-pay | Admitting: Licensed Clinical Social Worker

## 2024-05-25 NOTE — Telephone Encounter (Signed)
 2nd attempt to reach pt regarding referral to social work.  No answer. Left 2nd VM with direct contact information. Pt may be re-referred as needed.   Raekwan Spelman E Raynard Mapps, LCSW

## 2024-05-28 ENCOUNTER — Inpatient Hospital Stay: Payer: Medicare (Managed Care) | Attending: Internal Medicine | Admitting: Hematology and Oncology

## 2024-05-28 ENCOUNTER — Other Ambulatory Visit: Payer: Self-pay

## 2024-05-28 ENCOUNTER — Ambulatory Visit: Payer: Medicare (Managed Care)

## 2024-05-28 ENCOUNTER — Ambulatory Visit
Admission: RE | Admit: 2024-05-28 | Discharge: 2024-05-28 | Disposition: A | Discharge status: Home / Self Care | Payer: Medicare (Managed Care) | Source: Ambulatory Visit | Attending: Radiation Oncology | Admitting: Radiation Oncology

## 2024-05-28 VITALS — BP 134/90 | HR 84 | Temp 98.0°F | Resp 16 | Ht 63.0 in | Wt 144.9 lb

## 2024-05-28 DIAGNOSIS — Z79899 Other long term (current) drug therapy: Secondary | ICD-10-CM | POA: Insufficient documentation

## 2024-05-28 DIAGNOSIS — D0511 Intraductal carcinoma in situ of right breast: Secondary | ICD-10-CM | POA: Diagnosis present

## 2024-05-28 DIAGNOSIS — Z170 Estrogen receptor positive status [ER+]: Secondary | ICD-10-CM | POA: Diagnosis not present

## 2024-05-28 DIAGNOSIS — Z1721 Progesterone receptor positive status: Secondary | ICD-10-CM | POA: Insufficient documentation

## 2024-05-28 LAB — RAD ONC ARIA SESSION SUMMARY
Course Elapsed Days: 25
Plan Fractions Treated to Date: 13
Plan Prescribed Dose Per Fraction: 2.67 Gy
Plan Total Fractions Prescribed: 15
Plan Total Prescribed Dose: 40.05 Gy
Reference Point Dosage Given to Date: 34.71 Gy
Reference Point Session Dosage Given: 2.67 Gy
Session Number: 13

## 2024-05-28 MED ORDER — TAMOXIFEN CITRATE 10 MG PO TABS: 10.0000 mg | ORAL_TABLET | Freq: Every day | ORAL | 3 refills | Status: AC

## 2024-05-28 NOTE — Progress Notes (Signed)
 Patient Care Team: Mannie Olam Shiver, PA as PCP - General (Physician Assistant) Court Dorn PARAS, MD as PCP - Cardiology (Cardiology) Tyree Nanetta SAILOR, RN as Oncology Nurse Navigator Aron Shoulders, MD as Consulting Physician (General Surgery) Odean Potts, MD as Consulting Physician (Hematology and Oncology) Shannon Agent, MD as Consulting Physician (Radiation Oncology)  DIAGNOSIS:  Encounter Diagnosis  Name Primary?   Ductal carcinoma in situ (DCIS) of right breast Yes    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, ER 100%, PR 70%   03/14/2024 Cancer Staging   Staging form: Breast, AJCC 8th Edition - Clinical stage from 03/14/2024: Stage 0 (cTis (DCIS), cN0, cM0, ER+, PR+, HER2: Not Assessed) - Signed by Odean Potts, MD on 03/14/2024 Stage prefix: Initial diagnosis Nuclear grade: G2 Laterality: Right Staged by: Pathologist and managing physician Stage used in treatment planning: Yes National guidelines used in treatment planning: Yes Type of national guideline used in treatment planning: NCCN    Genetic Testing   Negative CancerNext-Expanded +RNAinsight panel. 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 The CancerNext-Expanded gene panel offered by [facility] and includes sequencing, rearrangement, and RNA analysis for the following 77 genes: AIP, ALK, APC, ATM, AXIN2, BAP1, BARD1, BMPR1A, BRCA1, BRCA2, BRIP1, CDC73, CDH1, CDK4, CDKN1B, CDKN2A, CEBPA, CHEK2, CTNNA1, DDX41, DICER1, EGFR, EPCAM, ETV6, FH, FLCN, GATA2, GREM1, HOXB13, KIT, LZTR1, MAX, MBD4, MEN1, MET, MITF, MLH1, MSH2, MSH3, MSH6, MUTYH, NF1, NF2, NTHL1, PALB2, PDGFRA, PHOX2B, PMS2, POLD1, POLE, POT1, PRKAR1A, PTCH1, PTEN, RAD51C, RAD51D, RB1, RET, RPS20, RUNX1, SDHA, SDHAF2, SDHB, SDHC, SDHD,  SMAD4, SMARCA4, SMARCB1, SMARCE1, STK11, SUFU, TMEM127, TP53, TSC1, TSC2, VHL, WT1. Report date 03/28/24.      CHIEF COMPLIANT: Follow-up to discuss antiestrogen therapy  HISTORY OF PRESENT ILLNESS:   History of Present Illness Mallory [name] is a 68 year old female with breast cancer undergoing radiation therapy who presents for a follow-up visit.  She is undergoing radiation therapy for breast cancer, with the final session scheduled for June 06, 2024. She is preparing to start tamoxifen  therapy following radiation. Her estrogen and progesterone receptors are positive. Her lymph nodes were not removed during surgery.  She is currently taking Wellbutrin  and has leftover oxycodone  from her surgery, which she has not used recently.     ALLERGIES:  is allergic to banana and latex.  MEDICATIONS:  Current Outpatient Medications  Medication Sig Dispense Refill   atorvastatin  (LIPITOR) 40 MG tablet Take 1 tablet (40 mg total) by mouth daily. 90 tablet 3   buPROPion  ER (WELLBUTRIN  SR) 100 MG 12 hr tablet Take 1 tablet (100 mg total) by mouth daily. 30 tablet 1   cetirizine (ZYRTEC) 10 MG tablet Take 10 mg by mouth daily.     famotidine (PEPCID) 20 MG tablet Take 20 mg by mouth in the morning and at bedtime.     fluticasone (FLONASE) 50 MCG/ACT nasal spray Place 2 sprays into the nose.     gabapentin (NEURONTIN) 100 MG capsule Take 100 mg by mouth daily as needed (hormonal hot flashes).     hydrOXYzine  (ATARAX ) 10 MG tablet Take 1 tablet (10 mg total) by mouth daily as needed for anxiety. 30 tablet 0   omeprazole (PRILOSEC) 20 MG capsule Take 20 mg by mouth in the morning.  oxyCODONE  (OXY IR/ROXICODONE ) 5 MG immediate release tablet Take 1 tablet (5 mg total) by mouth every 6 (six) hours as needed for severe pain (pain score 7-10). 15 tablet 0   propranolol ER (INDERAL LA) 60 MG 24 hr capsule Take 60 mg by mouth at bedtime.     venlafaxine  XR (EFFEXOR -XR) 150 MG 24 hr capsule Take 2  capsules (300 mg total) by mouth daily with breakfast. 60 capsule 1   b complex vitamins capsule Take 1 capsule by mouth daily. (Patient not taking: Reported on 05/28/2024)     Magnesium Gluconate 550 MG TABS Take by mouth. (Patient not taking: Reported on 05/28/2024)     No current facility-administered medications for this visit.    PHYSICAL EXAMINATION: ECOG PERFORMANCE STATUS: 1 - Symptomatic but completely ambulatory  Vitals:   05/28/24 1502  BP: (!) 134/90  Pulse: 84  Resp: 16  Temp: 98 F (36.7 C)  SpO2: 98%   Filed Weights   05/28/24 1502  Weight: 144 lb 14.4 oz (65.7 kg)      LABORATORY DATA:  I have reviewed the data as listed    Latest Ref Rng & Units 03/14/2024   12:22 PM 01/20/2024   12:51 PM 12/10/2022    2:17 PM  CMP  Glucose 70 - 99 mg/dL 889     BUN 8 - 23 mg/dL 18     Creatinine 9.55 - 1.00 mg/dL 9.12     Sodium 864 - 854 mmol/L 141     Potassium 3.5 - 5.1 mmol/L 4.0     Chloride 98 - 111 mmol/L 106     CO2 22 - 32 mmol/L 28     Calcium  8.9 - 10.3 mg/dL 8.9     Total Protein 6.5 - 8.1 g/dL 6.8  6.5  6.2   Total Bilirubin 0.0 - 1.2 mg/dL 0.4  <9.7  0.2   Alkaline Phos 38 - 126 U/L 96  127  101   AST 15 - 41 U/L 14  12  12    ALT 0 - 44 U/L 9  9  8      Lab Results  Component Value Date   WBC 9.9 03/14/2024   HGB 12.4 03/14/2024   HCT 38.0 03/14/2024   MCV 91.8 03/14/2024   PLT 259 03/14/2024   NEUTROABS 5.6 03/14/2024    ASSESSMENT & PLAN:  Ductal carcinoma in situ (DCIS) of right breast 03/06/2024: Palpable right breast mass for 2 weeks, ultrasound and mammogram revealed 0.8 cm mass which on biopsy came back intermediate grade DCIS with an intraductal papilloma, ER 100%, PR 70%  03/27/2024: Right lumpectomy: Solid papillary carcinoma DCIS, intermediate grade, negative for invasive cancer, 0.8 cm, margins negative, ER 100%, PR 70% 05/08/2024-06/06/2024: Adjuvant radiation  Tamoxifen  counseling: We discussed the risks and benefits of tamoxifen . These  include but not limited to insomnia, hot flashes, mood changes, vaginal dryness, and weight gain. Although rare, serious side effects including endometrial cancer, risk of blood clots were also discussed. We strongly believe that the benefits far outweigh the risks. Patient understands these risks and consented to starting treatment. Planned treatment duration is 5 years.  Patient has an appointment with survivorship care plan visit     No orders of the defined types were placed in this encounter.  The patient has a good understanding of the overall plan. she agrees with it. she will call with any problems that may develop before the next visit here. Total time spent: 30 mins including  face to face time and time spent for planning, charting and co-ordination of care   Viinay K Breton Berns, MD 05/28/24

## 2024-05-28 NOTE — Assessment & Plan Note (Signed)
 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 These include but not limited to insomnia, hot flashes, mood changes, vaginal dryness, and weight gain. Although rare, serious side effects including endometrial cancer, risk of blood clots were also discussed. We strongly believe that the benefits far outweigh the risks. Patient understands these risks and consented to starting treatment. Planned treatment duration is 5 years.  Patient has an appointment with survivorship care plan visit

## 2024-05-29 ENCOUNTER — Other Ambulatory Visit: Payer: Self-pay

## 2024-05-29 ENCOUNTER — Ambulatory Visit: Payer: Medicare (Managed Care)

## 2024-05-29 ENCOUNTER — Ambulatory Visit
Admission: RE | Admit: 2024-05-29 | Discharge: 2024-05-29 | Disposition: A | Discharge status: Home / Self Care | Payer: Medicare (Managed Care) | Source: Ambulatory Visit | Attending: Radiation Oncology | Admitting: Radiation Oncology

## 2024-05-29 DIAGNOSIS — D0511 Intraductal carcinoma in situ of right breast: Secondary | ICD-10-CM | POA: Diagnosis not present

## 2024-05-29 LAB — RAD ONC ARIA SESSION SUMMARY
Course Elapsed Days: 26
Plan Fractions Treated to Date: 14
Plan Prescribed Dose Per Fraction: 2.67 Gy
Plan Total Fractions Prescribed: 15
Plan Total Prescribed Dose: 40.05 Gy
Reference Point Dosage Given to Date: 37.38 Gy
Reference Point Session Dosage Given: 2.67 Gy
Session Number: 14

## 2024-05-30 ENCOUNTER — Ambulatory Visit: Payer: Medicare (Managed Care)

## 2024-05-30 ENCOUNTER — Ambulatory Visit
Admission: RE | Admit: 2024-05-30 | Discharge: 2024-05-30 | Disposition: A | Discharge status: Home / Self Care | Payer: Medicare (Managed Care) | Source: Ambulatory Visit | Attending: Radiation Oncology | Admitting: Radiation Oncology

## 2024-05-30 ENCOUNTER — Other Ambulatory Visit: Payer: Self-pay

## 2024-05-30 DIAGNOSIS — D0511 Intraductal carcinoma in situ of right breast: Secondary | ICD-10-CM | POA: Diagnosis not present

## 2024-05-30 LAB — RAD ONC ARIA SESSION SUMMARY
Course Elapsed Days: 27
Plan Fractions Treated to Date: 15
Plan Prescribed Dose Per Fraction: 2.67 Gy
Plan Total Fractions Prescribed: 15
Plan Total Prescribed Dose: 40.05 Gy
Reference Point Dosage Given to Date: 40.05 Gy
Reference Point Session Dosage Given: 2.67 Gy
Session Number: 15

## 2024-05-31 ENCOUNTER — Ambulatory Visit: Payer: Medicare (Managed Care)

## 2024-05-31 ENCOUNTER — Other Ambulatory Visit: Payer: Self-pay

## 2024-05-31 ENCOUNTER — Ambulatory Visit
Admission: RE | Admit: 2024-05-31 | Discharge: 2024-05-31 | Discharge status: Home / Self Care | Payer: Medicare (Managed Care) | Source: Ambulatory Visit | Attending: Radiation Oncology | Admitting: Radiation Oncology

## 2024-05-31 DIAGNOSIS — D0511 Intraductal carcinoma in situ of right breast: Secondary | ICD-10-CM | POA: Diagnosis not present

## 2024-05-31 LAB — RAD ONC ARIA SESSION SUMMARY
Course Elapsed Days: 28
Plan Fractions Treated to Date: 1
Plan Prescribed Dose Per Fraction: 2 Gy
Plan Total Fractions Prescribed: 5
Plan Total Prescribed Dose: 10 Gy
Reference Point Dosage Given to Date: 2 Gy
Reference Point Session Dosage Given: 2 Gy
Session Number: 16

## 2024-06-01 ENCOUNTER — Ambulatory Visit: Payer: Medicare (Managed Care)

## 2024-06-01 ENCOUNTER — Other Ambulatory Visit: Payer: Self-pay

## 2024-06-01 ENCOUNTER — Ambulatory Visit
Admission: RE | Admit: 2024-06-01 | Discharge: 2024-06-01 | Disposition: A | Discharge status: Home / Self Care | Payer: Medicare (Managed Care) | Source: Ambulatory Visit | Attending: Radiation Oncology | Admitting: Radiation Oncology

## 2024-06-01 DIAGNOSIS — D0511 Intraductal carcinoma in situ of right breast: Secondary | ICD-10-CM | POA: Diagnosis not present

## 2024-06-01 LAB — RAD ONC ARIA SESSION SUMMARY
Course Elapsed Days: 29
Plan Fractions Treated to Date: 2
Plan Prescribed Dose Per Fraction: 2 Gy
Plan Total Fractions Prescribed: 5
Plan Total Prescribed Dose: 10 Gy
Reference Point Dosage Given to Date: 4 Gy
Reference Point Session Dosage Given: 2 Gy
Session Number: 17

## 2024-06-04 ENCOUNTER — Ambulatory Visit: Payer: Medicare (Managed Care)

## 2024-06-04 ENCOUNTER — Ambulatory Visit
Admission: RE | Admit: 2024-06-04 | Discharge: 2024-06-04 | Disposition: A | Discharge status: Home / Self Care | Payer: Medicare (Managed Care) | Source: Ambulatory Visit | Attending: Radiation Oncology | Admitting: Radiation Oncology

## 2024-06-04 ENCOUNTER — Other Ambulatory Visit: Payer: Self-pay

## 2024-06-04 DIAGNOSIS — D0511 Intraductal carcinoma in situ of right breast: Secondary | ICD-10-CM | POA: Diagnosis not present

## 2024-06-04 LAB — RAD ONC ARIA SESSION SUMMARY
Course Elapsed Days: 32
Plan Fractions Treated to Date: 3
Plan Prescribed Dose Per Fraction: 2 Gy
Plan Total Fractions Prescribed: 5
Plan Total Prescribed Dose: 10 Gy
Reference Point Dosage Given to Date: 6 Gy
Reference Point Session Dosage Given: 2 Gy
Session Number: 18

## 2024-06-05 ENCOUNTER — Ambulatory Visit
Admission: RE | Admit: 2024-06-05 | Discharge: 2024-06-05 | Disposition: A | Discharge status: Home / Self Care | Payer: Medicare (Managed Care) | Source: Ambulatory Visit | Attending: Radiation Oncology | Admitting: Radiation Oncology

## 2024-06-05 ENCOUNTER — Other Ambulatory Visit: Payer: Self-pay

## 2024-06-05 ENCOUNTER — Ambulatory Visit: Payer: Medicare (Managed Care)

## 2024-06-05 DIAGNOSIS — D0511 Intraductal carcinoma in situ of right breast: Secondary | ICD-10-CM | POA: Diagnosis not present

## 2024-06-05 LAB — RAD ONC ARIA SESSION SUMMARY
Course Elapsed Days: 33
Plan Fractions Treated to Date: 4
Plan Prescribed Dose Per Fraction: 2 Gy
Plan Total Fractions Prescribed: 5
Plan Total Prescribed Dose: 10 Gy
Reference Point Dosage Given to Date: 8 Gy
Reference Point Session Dosage Given: 2 Gy
Session Number: 19

## 2024-06-06 ENCOUNTER — Ambulatory Visit: Payer: Medicare (Managed Care)

## 2024-06-07 ENCOUNTER — Other Ambulatory Visit: Payer: Self-pay

## 2024-06-07 ENCOUNTER — Ambulatory Visit
Admission: RE | Admit: 2024-06-07 | Discharge: 2024-06-07 | Disposition: A | Discharge status: Home / Self Care | Payer: Medicare (Managed Care) | Source: Ambulatory Visit | Attending: Radiation Oncology | Admitting: Radiation Oncology

## 2024-06-07 DIAGNOSIS — D0511 Intraductal carcinoma in situ of right breast: Secondary | ICD-10-CM | POA: Diagnosis not present

## 2024-06-07 LAB — RAD ONC ARIA SESSION SUMMARY
Course Elapsed Days: 35
Plan Fractions Treated to Date: 5
Plan Prescribed Dose Per Fraction: 2 Gy
Plan Total Fractions Prescribed: 5
Plan Total Prescribed Dose: 10 Gy
Reference Point Dosage Given to Date: 10 Gy
Reference Point Session Dosage Given: 2 Gy
Session Number: 20

## 2024-06-08 NOTE — Radiation Completion Notes (Addendum)
  Radiation Oncology         [phone] ________________________________  Name: Mallory [name] MRN: [id number]  Date of Service: 06/07/2024  DOB: 1955/11/06  End of Treatment Note  Diagnosis: Stage 0 (cTis (DCIS), cN0, cM0) Right Breast, Intermediate grade DCIS, ER+ / PR+ / Her2 not assessed  Intent: Curative     ==========DELIVERED PLANS==========  First Treatment Date: 2024-05-03 Last Treatment Date: 2024-06-07   Plan Name: Breast_R Site: Breast, Right Technique: 3D Mode: Photon Dose Per Fraction: 2.67 Gy Prescribed Dose (Delivered / Prescribed): 40.05 Gy / 40.05 Gy Prescribed Fxs (Delivered / Prescribed): 15 / 15   Plan Name: Breast_R_Bst Site: Breast, Right Technique: 3D Mode: Photon Dose Per Fraction: 2 Gy Prescribed Dose (Delivered / Prescribed): 10 Gy / 10 Gy Prescribed Fxs (Delivered / Prescribed): 5 / 5     ====================================   The patient tolerated radiation. She developed fatigue and anticipated skin changes in the treatment field.   The patient will return in one month and will continue follow up with Dr. Gudena as well.      Ronita Due, PA-C

## 2024-06-26 ENCOUNTER — Ambulatory Visit (HOSPITAL_COMMUNITY): Payer: Medicare (Managed Care) | Admitting: Psychiatry

## 2024-07-06 ENCOUNTER — Encounter: Payer: Self-pay | Admitting: Radiation Oncology

## 2024-07-09 ENCOUNTER — Ambulatory Visit
Admission: RE | Admit: 2024-07-09 | Discharge: 2024-07-09 | Disposition: A | Discharge status: Home / Self Care | Payer: Medicare (Managed Care) | Source: Ambulatory Visit | Attending: Radiation Oncology | Admitting: Radiation Oncology

## 2024-07-09 ENCOUNTER — Encounter: Payer: Self-pay | Admitting: Radiation Oncology

## 2024-07-09 VITALS — BP 132/92 | HR 73 | Temp 98.4°F | Resp 18 | Wt 146.0 lb

## 2024-07-09 DIAGNOSIS — D0511 Intraductal carcinoma in situ of right breast: Secondary | ICD-10-CM | POA: Diagnosis present

## 2024-07-09 DIAGNOSIS — Z79899 Other long term (current) drug therapy: Secondary | ICD-10-CM | POA: Insufficient documentation

## 2024-07-09 DIAGNOSIS — Z171 Estrogen receptor negative status [ER-]: Secondary | ICD-10-CM | POA: Diagnosis not present

## 2024-07-09 DIAGNOSIS — Z79810 Long term (current) use of selective estrogen receptor modulators (SERMs): Secondary | ICD-10-CM | POA: Diagnosis not present

## 2024-07-09 DIAGNOSIS — Z1721 Progesterone receptor positive status: Secondary | ICD-10-CM | POA: Insufficient documentation

## 2024-07-09 DIAGNOSIS — Z923 Personal history of irradiation: Secondary | ICD-10-CM | POA: Insufficient documentation

## 2024-07-09 NOTE — Progress Notes (Signed)
 Mallory [name] is here today for follow up post radiation to the breast.   Breast Side:Right Breast   They completed their radiation on: 06/07/24  Does the patient complain of any of the following: Post radiation skin issues: She reports flaking ans still some darkening. Breast Tenderness: Yes Breast Swelling: Yes Lymphadema: Denies Range of Motion limitations: Denies Fatigue post radiation: Mild Appetite good/fair/poor: Good  Additional comments if applicable: She reports water weight and breast swelling like she is coming on her menstrual. BP (!) 132/92 (BP Location: Left Arm, Patient Position: Sitting)   Pulse 73   Temp 98.4 F (36.9 C) (Oral)   Resp 18   Wt 146 lb (66.2 kg)   LMP 11/11/2012   SpO2 100%   BMI 25.86 kg/m

## 2024-07-09 NOTE — Progress Notes (Signed)
 Radiation Oncology         [phone] ________________________________  Name: Mallory [name] MRN: [id number]  Date: 07/09/2024  DOB: 1956/06/22  Follow-Up Visit Note  CC: Mannie Olam Shiver, PA  Mannie Olam Shiver, *    ICD-10-CM   1. Ductal carcinoma in situ (DCIS) of right breast  D05.11      Diagnosis: The encounter diagnosis was Ductal carcinoma in situ (DCIS) of right breast.   Stage 0 (cTis (DCIS), cN0, cM0) Right Breast, Intermediate grade DCIS, ER+ / PR+ / Her2 not assessed  Interval Since Last Radiation:  1 month and 2 days   Intent: Curative  Radiation Treatment Dates: First Treatment Date: 2024-05-03 -- Last Treatment Date: 2024-06-07 Site/Dose/Technique/Mode:  Plan Name: Breast_R Site: Breast, Right Technique: 3D Mode: Photon Dose Per Fraction: 2.67 Gy Prescribed Dose (Delivered / Prescribed): 40.05 Gy / 40.05 Gy Prescribed Fxs (Delivered / Prescribed): 15 / 15   Plan Name: Breast_R_Bst Site: Breast, Right Technique: 3D Mode: Photon Dose Per Fraction: 2 Gy Prescribed Dose (Delivered / Prescribed): 10 Gy / 10 Gy Prescribed Fxs (Delivered / Prescribed): 5 / 5  Narrative:  The patient returns today for routine follow-up. She tolerated radiation therapy quite well overall other than some mild fatigue and anticipated skin changes in the treatment field.   In the interval since she began radiation therapy, she followed up with Dr. Gudena on 05/28/24. She was noted to be doing well at that time and she consented to proceed with antiestrogen therapy consisting of tamoxifen  after completing radiation therapy. She will return to Dr. Gudena in December to review her survivorship care plan.   No other significant interval history since she completed radiation therapy.   Patient reports to be doing well overall today. She notes some residual dry skin that she is continuing to use Radiaplex for. She notes some fatigue, but also attributes this to at-home  stressors.                              Allergies:  is allergic to banana and latex.  Meds: Current Outpatient Medications  Medication Sig Dispense Refill   atorvastatin  (LIPITOR) 40 MG tablet Take 1 tablet (40 mg total) by mouth daily. 90 tablet 3   b complex vitamins capsule Take 1 capsule by mouth daily. (Patient not taking: Reported on 05/28/2024)     buPROPion  ER (WELLBUTRIN  SR) 100 MG 12 hr tablet Take 1 tablet (100 mg total) by mouth daily. 30 tablet 1   cetirizine (ZYRTEC) 10 MG tablet Take 10 mg by mouth daily.     famotidine (PEPCID) 20 MG tablet Take 20 mg by mouth in the morning and at bedtime.     fluticasone (FLONASE) 50 MCG/ACT nasal spray Place 2 sprays into the nose.     gabapentin (NEURONTIN) 100 MG capsule Take 100 mg by mouth daily as needed (hormonal hot flashes).     hydrOXYzine  (ATARAX ) 10 MG tablet Take 1 tablet (10 mg total) by mouth daily as needed for anxiety. 30 tablet 0   Magnesium Gluconate 550 MG TABS Take by mouth. (Patient not taking: Reported on 05/28/2024)     omeprazole (PRILOSEC) 20 MG capsule Take 20 mg by mouth in the morning.     propranolol ER (INDERAL LA) 60 MG 24 hr capsule Take 60 mg by mouth at bedtime.     tamoxifen  (NOLVADEX ) 10 MG tablet Take 1 tablet (10 mg total)  by mouth daily. 90 tablet 3   venlafaxine  XR (EFFEXOR -XR) 150 MG 24 hr capsule Take 2 capsules (300 mg total) by mouth daily with breakfast. 60 capsule 1   No current facility-administered medications for this encounter.    Physical Findings: The patient is in no acute distress. Patient is alert and oriented.  weight is 146 lb (66.2 kg). Her oral temperature is 98.4 F (36.9 C). Her blood pressure is 132/92 (abnormal) and her pulse is 73. Her respiration is 18 and oxygen saturation is 100%. .  No palpable cervical, supraclavicular, or axillary adenopathy.   Left Breast: no palpable mass, nipple discharge or bleeding. Right Breast: mild hyperpigmentation within the treatment field.  Firm tissue palpated beneath the well-healed incision, consistent with history of surgery. Full ROM in right shoulder.   Lab Findings: Lab Results  Component Value Date   WBC 9.9 03/14/2024   HGB 12.4 03/14/2024   HCT 38.0 03/14/2024   MCV 91.8 03/14/2024   PLT 259 03/14/2024    Radiographic Findings: No results found.  Impression/Plan: Stage 0 (cTis (DCIS), cN0, cM0) Right Breast, Intermediate grade DCIS, ER+ / PR+ / Her2 not assessed  The patient is recovering from the effects of radiation. Recommend finishing Radiaplex and the addition of Vitamin E lotion/oil once she has finished her Radiaplex.   Patient is understandably anxious about disease recurrence. We discussed recommended follow-up and recommended attending our support group to help with this.  Patient will continue on Tamoxifen  under the care of Dr. Odean. She is scheduled to see Morna Kendall for survivorship clinic on 08/24/2024. Radiation follow-up PRN. We appreciate the opportunity to take part in this patient's care. She has been advised to call back with any questions or concerns.    30 minutes of total time was spent for this patient encounter, including preparation, face-to-face counseling with the patient and coordination of care, physical exam, and documentation of the encounter. ____________________________________   Leeroy Due, PA-C   This document serves as a record of services personally performed by Leeroy Due, PA-C. It was created on her behalf by Dorthy Fuse, a trained medical scribe. The creation of this record is based on the scribe's personal observations and the provider's statements to them. This document has been checked and approved by the attending provider.

## 2024-07-13 ENCOUNTER — Other Ambulatory Visit (HOSPITAL_COMMUNITY): Payer: Self-pay | Admitting: Psychiatry

## 2024-07-13 DIAGNOSIS — F411 Generalized anxiety disorder: Secondary | ICD-10-CM

## 2024-07-13 DIAGNOSIS — Z630 Problems in relationship with spouse or partner: Secondary | ICD-10-CM

## 2024-07-31 ENCOUNTER — Ambulatory Visit: Payer: Medicare (Managed Care) | Attending: Cardiovascular Disease | Admitting: Cardiovascular Disease

## 2024-08-14 ENCOUNTER — Other Ambulatory Visit: Payer: Self-pay | Admitting: *Deleted

## 2024-08-14 ENCOUNTER — Telehealth: Payer: Self-pay

## 2024-08-14 DIAGNOSIS — M7989 Other specified soft tissue disorders: Secondary | ICD-10-CM

## 2024-08-14 NOTE — Telephone Encounter (Signed)
 Patient called in to report bilateral lower extremity edema. Reports swelling is worse to the left leg. Reports leg is warm to touch and painful. Denies redness to extremites.  Patient completed15 treatments to right breast on 06/07/24. Patient is currently taking tamoxifen .

## 2024-08-14 NOTE — Progress Notes (Signed)
 Received call from pt with complaint of bilateral lower extremity swelling and pain.  Worse in left lower extremity.  Per MD pt needing Vas US  to r/o DVT.  Orders placed, appt scheduled, pt notified and verbalized understanding.

## 2024-08-15 ENCOUNTER — Ambulatory Visit (HOSPITAL_COMMUNITY): Admission: RE | Admit: 2024-08-15 | Discharge: 2024-08-15 | Payer: Medicare (Managed Care) | Attending: Vascular Surgery

## 2024-08-15 DIAGNOSIS — M7989 Other specified soft tissue disorders: Secondary | ICD-10-CM | POA: Insufficient documentation

## 2024-08-16 ENCOUNTER — Inpatient Hospital Stay: Payer: Medicare (Managed Care) | Attending: Internal Medicine | Admitting: Physician Assistant

## 2024-08-16 VITALS — BP 143/73 | HR 65 | Temp 97.3°F | Resp 16

## 2024-08-16 DIAGNOSIS — Z79810 Long term (current) use of selective estrogen receptor modulators (SERMs): Secondary | ICD-10-CM | POA: Diagnosis not present

## 2024-08-16 DIAGNOSIS — D0511 Intraductal carcinoma in situ of right breast: Secondary | ICD-10-CM | POA: Diagnosis not present

## 2024-08-16 DIAGNOSIS — F17210 Nicotine dependence, cigarettes, uncomplicated: Secondary | ICD-10-CM | POA: Insufficient documentation

## 2024-08-16 DIAGNOSIS — M7989 Other specified soft tissue disorders: Secondary | ICD-10-CM
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
## 2024-08-22 ENCOUNTER — Ambulatory Visit: Payer: Medicare (Managed Care) | Attending: Cardiovascular Disease | Admitting: Cardiovascular Disease

## 2024-08-24 ENCOUNTER — Encounter: Payer: Self-pay | Admitting: Adult Health

## 2024-08-24 ENCOUNTER — Inpatient Hospital Stay: Payer: Medicare (Managed Care) | Admitting: Adult Health

## 2024-08-24 VITALS — BP 136/66 | HR 70 | Temp 97.8°F | Resp 16 | Ht 63.0 in | Wt 154.0 lb

## 2024-08-24 DIAGNOSIS — F17210 Nicotine dependence, cigarettes, uncomplicated: Secondary | ICD-10-CM

## 2024-08-24 DIAGNOSIS — D0511 Intraductal carcinoma in situ of right breast: Secondary | ICD-10-CM | POA: Diagnosis not present

## 2024-08-24 DIAGNOSIS — Z122 Encounter for screening for malignant neoplasm of respiratory organs: Secondary | ICD-10-CM | POA: Diagnosis not present

## 2024-08-24 NOTE — Progress Notes (Signed)
 SURVIVORSHIP VISIT:  BRIEF ONCOLOGIC HISTORY:  Oncology History  Ductal carcinoma in situ (DCIS) of right breast  03/06/2024 Initial Diagnosis   Palpable right breast mass for 2 weeks, ultrasound and mammogram revealed 0.8 cm mass which on biopsy came back intermediate grade DCIS with an intraductal papilloma, ER 100%, PR 70%   03/14/2024 Cancer Staging   Staging form: Breast, AJCC 8th Edition - Clinical stage from 03/14/2024: Stage 0 (cTis (DCIS), cN0, cM0, ER+, PR+, HER2: Not Assessed) - Signed by Odean Potts, MD on 03/14/2024 Stage prefix: Initial diagnosis Nuclear grade: G2 Laterality: Right Staged by: Pathologist and managing physician Stage used in treatment planning: Yes National guidelines used in treatment planning: Yes Type of national guideline used in treatment planning: NCCN    Genetic Testing   Negative CancerNext-Expanded +RNAinsight panel. 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 The CancerNext-Expanded gene panel offered by [facility] and includes sequencing, rearrangement, and RNA analysis for the following 77 genes: AIP, ALK, APC, ATM, AXIN2, BAP1, BARD1, BMPR1A, BRCA1, BRCA2, BRIP1, CDC73, CDH1, CDK4, CDKN1B, CDKN2A, CEBPA, CHEK2, CTNNA1, DDX41, DICER1, EGFR, EPCAM, ETV6, FH, FLCN, GATA2, GREM1, HOXB13, KIT, LZTR1, MAX, MBD4, MEN1, MET, MITF, MLH1, MSH2, MSH3, MSH6, MUTYH, NF1, NF2, NTHL1, PALB2, PDGFRA, PHOX2B, PMS2, POLD1, POLE, POT1, PRKAR1A, PTCH1, PTEN, RAD51C, RAD51D, RB1, RET, RPS20, RUNX1, SDHA, SDHAF2, SDHB, SDHC, SDHD, SMAD4, SMARCA4, SMARCB1, SMARCE1, STK11, SUFU, TMEM127, TP53, TSC1, TSC2, VHL, WT1. Report date 03/28/24.    05/03/2024 - 06/07/2024 Radiation Therapy   Plan Name: Breast_R Site: Breast, Right Technique: 3D Mode: Photon Dose Per Fraction: 2.67 Gy Prescribed Dose (Delivered / Prescribed): 40.05 Gy / 40.05 Gy Prescribed Fxs (Delivered / Prescribed): 15 / 15   Plan Name: Breast_R_Bst Site: Breast, Right Technique: 3D Mode: Photon Dose Per Fraction: 2 Gy Prescribed Dose  (Delivered / Prescribed): 10 Gy / 10 Gy Prescribed Fxs (Delivered / Prescribed): 5 / 5   05/2024 -  Anti-estrogen oral therapy   Tamoxifen  x 5 years     INTERVAL HISTORY:  Discussed the use of AI scribe software for clinical note transcription with the Mallory, who gave verbal consent to proceed.  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  She is six months post radiation for right breast DCIS. She takes nightly tamoxifen  without new or worsening side effects. She has no new breast masses, pain, discharge, or palpable lumps. She has persistent fatigue that she attributes to tamoxifen  and prior radiation, and occasional hot flashes that are well controlled with gabapentin.  She is due for an annual screening mammogram in June and asks about appropriate timing in the setting of recent treatment. She is worried about recurrence in either breast.  She currently smokes half a pack per day with a 21.5 pack-year history and notes increased tobacco use related to stress and bereavement.  She has intermittent leg swelling and pain from a known Baker's cyst. Prior Doppler ultrasound was negative for deep vein thrombosis. She has difficulty with ambulation and variable pain and is scheduled for orthopedic evaluation and is considering possible hip surgery.    REVIEW OF SYSTEMS:  Review of Systems  Constitutional:  Negative for appetite change, chills, fatigue, fever and unexpected weight change.  HENT:   Negative for hearing loss, lump/mass and trouble swallowing.   Eyes:  Negative for eye problems and icterus.  Respiratory:  Negative for chest tightness, cough and shortness of breath.   Cardiovascular:  Negative for chest pain, leg swelling and palpitations.  Gastrointestinal:  Negative for  abdominal distention, abdominal pain, constipation, diarrhea, nausea and vomiting.  Endocrine: Negative for hot flashes.  Genitourinary:  Negative for difficulty urinating.   Musculoskeletal:  Negative for arthralgias.  Skin:  Negative for itching and rash.  Neurological:  Negative for dizziness, extremity weakness, headaches and numbness.  Hematological:  Negative for adenopathy. Does not bruise/bleed easily.  Psychiatric/Behavioral:  Negative for depression. The Mallory is not nervous/anxious.    Breast: Denies any new nodularity, masses, tenderness, nipple changes, or nipple discharge.       PAST MEDICAL/SURGICAL HISTORY:  Past Medical History:  Diagnosis Date   Anxiety    Arthritis    right hip, left thumb   Family history of melanoma    GAD (generalized anxiety disorder)    GERD (gastroesophageal reflux disease)    Headache    History of radiation therapy    Right breast- 05/03/24-06/07/24-Dr. 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    Past Surgical History:  Procedure Laterality Date   BREAST LUMPECTOMY WITH RADIOACTIVE SEED LOCALIZATION Right 03/27/2024   Procedure: RIGHT BREAST SEED LOCALIZED LUMPECTOMY;  Surgeon: Aron Shoulders, MD;  Location: [facility];  Service: General;  Laterality: Right;   CESAREAN SECTION     DILITATION & CURRETTAGE/HYSTROSCOPY WITH ESSURE     EXPLORATORY LAPAROTOMY     HERNIA REPAIR     as baby   spleenectomy     at age 28   TUBAL LIGATION       ALLERGIES:  Allergies[1]   CURRENT MEDICATIONS:  Outpatient Encounter Medications as of 08/24/2024  Medication Sig   atorvastatin  (LIPITOR) 40 MG tablet Take 1 tablet (40 mg total) by mouth daily.   b complex vitamins capsule Take 1 capsule by mouth daily.   buPROPion  ER (WELLBUTRIN  SR) 100 MG 12 hr tablet Take 1 tablet by mouth once daily   cetirizine (ZYRTEC) 10 MG tablet  Take 10 mg by mouth daily.   famotidine (PEPCID) 20 MG tablet Take 20 mg by mouth in the morning and at bedtime.   fluticasone (FLONASE) 50 MCG/ACT nasal spray Place 2 sprays into the nose.   gabapentin (NEURONTIN) 100 MG capsule Take 100 mg by mouth daily as needed (hormonal hot flashes). (Mallory taking differently: Take 100 mg by mouth daily.)   hydrOXYzine  (ATARAX ) 10 MG tablet Take 1 tablet (10 mg total) by mouth daily as needed for anxiety.   Magnesium Gluconate 550 MG TABS Take by mouth.   omeprazole (PRILOSEC) 20 MG capsule Take 20 mg by mouth in the morning.   propranolol ER (INDERAL LA) 60 MG 24 hr capsule Take 60 mg by mouth at bedtime.   tamoxifen  (NOLVADEX ) 10 MG tablet Take 1 tablet (10 mg total) by mouth daily.   venlafaxine  XR (EFFEXOR -XR) 150 MG 24 hr capsule TAKE 2 CAPSULES BY MOUTH ONCE DAILY WITH BREAKFAST   No facility-administered encounter medications on file as of 08/24/2024.     ONCOLOGIC FAMILY HISTORY:  Family History  Problem Relation Age of Onset   Melanoma Mother    Coronary artery disease Maternal Grandmother    Cancer Maternal Grandmother    Coronary artery disease Maternal Grandfather    Cancer Maternal Grandfather    Coronary artery disease  Paternal Grandmother    Cancer Paternal Grandmother    Coronary artery disease Paternal Grandfather    Cancer Paternal Grandfather    Melanoma Daughter 49   Diabetes Cousin    Alcohol abuse Neg Hx    Anxiety disorder Neg Hx    Bipolar disorder Neg Hx    Depression Neg Hx    Drug abuse Neg Hx    OCD Neg Hx    Schizophrenia Neg Hx    ADD / ADHD Neg Hx      SOCIAL HISTORY:  Social History   Socioeconomic History   Marital status: Widowed    Spouse name: Not on file   Number of children: Not on file   Years of education: Not on file   Highest education level: Not on file  Occupational History   Not on file  Tobacco Use   Smoking status: Every Day    Current packs/day: 0.50    Average packs/day:  0.5 packs/day for 39.0 years (19.5 ttl pk-yrs)    Types: Cigarettes   Smokeless tobacco: Never   Tobacco comments:    Working to quit currently as she is cutting back and wants to quit.  Vaping Use   Vaping status: Never Used  Substance and Sexual Activity   Alcohol use: Yes    Comment: social   Drug use: No   Sexual activity: Not Currently    Partners: Male    Birth control/protection: Post-menopausal  Other Topics Concern   Not on file  Social History Narrative   Not on file   Social Drivers of Health   Tobacco Use: High Risk (08/24/2024)   Mallory History    Smoking Tobacco Use: Every Day    Smokeless Tobacco Use: Never    Passive Exposure: Not on file  Financial Resource Strain: Low Risk (08/24/2024)   Overall Financial Resource Strain (CARDIA)    Difficulty of Paying Living Expenses: Not hard at all  Food Insecurity: No Food Insecurity (08/24/2024)   Epic    Worried About Radiation Protection Practitioner of Food in the Last Year: Never true    Ran Out of Food in the Last Year: Never true  Transportation Needs: No Transportation Needs (08/24/2024)   Epic    Lack of Transportation (Medical): No    Lack of Transportation (Non-Medical): No  Physical Activity: Inactive (08/24/2024)   Exercise Vital Sign    Days of Exercise per Week: 0 days    Minutes of Exercise per Session: 0 min  Stress: No Stress Concern Present (08/24/2024)   [employer] of Occupational Health - Occupational Stress Questionnaire    Feeling of Stress: Not at all  Social Connections: Socially Isolated (08/24/2024)   Social Connection and Isolation Panel    Frequency of Communication with Friends and Family: Three times a week    Frequency of Social Gatherings with Friends and Family: Once a week    Attends Religious Services: Never    Database Administrator or Organizations: No    Attends Banker Meetings: Never    Marital Status: Widowed  Intimate Partner Violence: Not At Risk (08/24/2024)   Epic     Fear of Current or Ex-Partner: No    Emotionally Abused: No    Physically Abused: No    Sexually Abused: No  Depression (PHQ2-9): Low Risk (08/24/2024)   Depression (PHQ2-9)    PHQ-2 Score: 0  Alcohol Screen: Low Risk (08/24/2024)   Alcohol Screen    Last Alcohol Screening Score (  AUDIT): 1  Housing: Unknown (08/24/2024)   Epic    Unable to Pay for Housing in the Last Year: No    Number of Times Moved in the Last Year: Not on file    Homeless in the Last Year: No  Utilities: Not At Risk (08/24/2024)   Epic    Threatened with loss of utilities: No  Health Literacy: Adequate Health Literacy (08/24/2024)   B1300 Health Literacy    Frequency of need for help with medical instructions: Never     OBSERVATIONS/OBJECTIVE:  BP 136/66 (BP Location: Left Arm, Mallory Position: Sitting)   Pulse 70   Temp 97.8 F (36.6 C) (Temporal)   Resp 16   Ht 5' 3 (1.6 m)   Wt 154 lb (69.9 kg)   LMP 11/11/2012   SpO2 100%   BMI 27.28 kg/m  GENERAL: Mallory is a well appearing female in no acute distress HEENT:  Sclerae anicteric.  Oropharynx clear and moist. No ulcerations or evidence of oropharyngeal candidiasis. Neck is supple.  NODES:  No cervical, supraclavicular, or axillary lymphadenopathy palpated.  BREAST EXAM:  Right breast s/p lumpectomy and radiation, no sign of local recurrence; right breast benign LUNGS:  Clear to auscultation bilaterally.  No wheezes or rhonchi. HEART:  Regular rate and rhythm. No murmur appreciated. ABDOMEN:  Soft, nontender.  Positive, normoactive bowel sounds. No organomegaly palpated. MSK:  No focal spinal tenderness to palpation. Full range of motion bilaterally in the upper extremities. EXTREMITIES:  No peripheral edema.   SKIN:  Clear with no obvious rashes or skin changes. No nail dyscrasia. NEURO:  Nonfocal. Well oriented.  Appropriate affect.   LABORATORY DATA:  None for this visit.  DIAGNOSTIC IMAGING:  None for this visit.      ASSESSMENT  AND PLAN:  Mallory [name] is a pleasant 68 y.o. female with Stage 0 right breast DCIS, ER+/PR+, diagnosed in 02/2024, treated with lumpectomy, adjuvant radiation therapy, and anti-estrogen therapy with Tamoxifen  beginning in 05/2024.  She presents to the Survivorship Clinic for our initial meeting and routine follow-up post-completion of treatment for breast cancer.    1. Stage 0 right breast cancer:  Mallory [name] is continuing to recover from definitive treatment for breast cancer. She will follow-up with her medical oncologist, Dr.  Odean in 6 months with history and physical exam per surveillance protocol.  She will continue her anti-estrogen therapy with Tamoxifen . Thus far, she is tolerating the Tamoxifen  well, with minimal side effects. Her mammogram is due 02/2025; orders placed today.   Today, a comprehensive survivorship care plan and treatment summary was reviewed with the Mallory today detailing her breast cancer diagnosis, treatment course, potential late/long-term effects of treatment, appropriate follow-up care with recommendations for the future, and Mallory education resources.  A copy of this summary, along with a letter will be sent to the patients primary care provider via mail/fax/In Basket message after todays visit.    2. Bone health:  She was given education on specific activities to promote bone health.  3. Cancer screening:  Due to Mallory [name] history and her age, she should receive screening for skin cancers, colon cancer, and gynecologic cancers.  The information and recommendations are listed on the Mallory's comprehensive care plan/treatment summary and were reviewed in detail with the Mallory.    4. Lung cancer screening  I discussed screening Low Dose Ct chest without contrast for early detection of lung cancer in this Counseling and Shared Decision-Making Visit Mallory [name] with 1956/02/22  and Age 44 y.o. years met the following criteria and I counseled that in  A) age  34-75 AND B) smoking history of 20 pack year smoking AND C) Current smoker or one who has quit smoking within the last 15 years  I counseled  - Annual low dose CT chest can pick up lung cancer early and has potential to save lives and cure lung cancer - This is similar in concept to screening mammogram, colonoscopies and pap smears - I explained Ct scan chest is low dose radiation CT chest - I explained early lung cancer asymptomatic and only way to  detect is CT  With the real advantage that early lung cancer is curable through radiation or surgery - I explained CT superior to CXR - I explained that false positives are present and can incur cost and workup like biopsies, additional scan but benefit outweighs risk - I counseled that Mallory should QUIT SMOKING - I counseled that Mallory should adhered to protocol requirements of scan and followup scans  5. Health maintenance and wellness promotion: Mallory [name] was encouraged to consume 5-7 servings of fruits and vegetables per day. We reviewed the Nutrition Rainbow handout.  She was also encouraged to engage in moderate to vigorous exercise for 30 minutes per day most days of the week.  She was instructed to limit her alcohol consumption and was encouraged stop smoking.     6. Support services/counseling: It is not uncommon for this period of the Mallory's cancer care trajectory to be one of many emotions and stressors.   She was given information regarding our available services and encouraged to contact me with any questions or for help enrolling in any of our support group/programs.    Follow up instructions:    -Return to cancer center in 6 months for f/u with Dr. Odean -Follow up with me in 1 year for survivorship and lung cancer screening -Lung cancer screening ordered -Mammogram due in 02/2025 -She is welcome to return back to the Survivorship Clinic at any time; no additional follow-up needed at this time.  -Consider referral back  to survivorship as a long-term survivor for continued surveillance  The Mallory was provided an opportunity to ask questions and all were answered. The Mallory agreed with the plan and demonstrated an understanding of the instructions.   Total encounter time:45 minutes*in face-to-face visit time, chart review, lab review, care coordination, order entry, and documentation of the encounter time.    Morna Kendall, NP 08/24/2024 11:29 AM Medical Oncology and Hematology [facility] [address] [place], [place] [address] Tel. [phone]    Fax. [phone]  *Total Encounter Time as defined by the Centers for Medicare and Medicaid Services includes, in addition to the face-to-face time of a Mallory visit (documented in the note above) non-face-to-face time: obtaining and reviewing outside history, ordering and reviewing medications, tests or procedures, care coordination (communications with other health care professionals or caregivers) and documentation in the medical record.     [1]  Allergies Allergen Reactions   Banana Itching   Latex Itching

## 2024-08-30 ENCOUNTER — Other Ambulatory Visit (HOSPITAL_COMMUNITY): Payer: Self-pay | Admitting: Psychiatry

## 2024-08-30 DIAGNOSIS — Z630 Problems in relationship with spouse or partner: Secondary | ICD-10-CM

## 2024-08-30 DIAGNOSIS — F411 Generalized anxiety disorder: Secondary | ICD-10-CM

## 2024-09-17 ENCOUNTER — Ambulatory Visit: Payer: Medicare (Managed Care) | Attending: Cardiovascular Disease | Admitting: Cardiovascular Disease

## 2024-09-18 ENCOUNTER — Encounter: Payer: Self-pay | Admitting: Cardiovascular Disease

## 2024-10-09 ENCOUNTER — Other Ambulatory Visit (HOSPITAL_COMMUNITY): Payer: Self-pay | Admitting: Psychiatry

## 2024-10-09 DIAGNOSIS — F411 Generalized anxiety disorder: Secondary | ICD-10-CM

## 2024-10-09 DIAGNOSIS — Z630 Problems in relationship with spouse or partner: Secondary | ICD-10-CM

## 2025-02-25 ENCOUNTER — Inpatient Hospital Stay: Payer: Medicare (Managed Care) | Admitting: Hematology and Oncology

## 2025-08-26 ENCOUNTER — Inpatient Hospital Stay: Payer: Medicare (Managed Care) | Admitting: Adult Health
